# Patient Record
Sex: Female | Born: 1955
Health system: Southern US, Community
[De-identification: ages and names within clinical notes are randomized; demographics above are authoritative.]

## PROBLEM LIST (undated history)

## (undated) DIAGNOSIS — C50919 Malignant neoplasm of unspecified site of unspecified female breast: Secondary | ICD-10-CM

## (undated) DIAGNOSIS — K635 Polyp of colon: Secondary | ICD-10-CM

## (undated) DIAGNOSIS — I10 Essential (primary) hypertension: Secondary | ICD-10-CM

## (undated) DIAGNOSIS — Z8042 Family history of malignant neoplasm of prostate: Secondary | ICD-10-CM

## (undated) DIAGNOSIS — Z803 Family history of malignant neoplasm of breast: Secondary | ICD-10-CM

## (undated) DIAGNOSIS — M1990 Unspecified osteoarthritis, unspecified site: Secondary | ICD-10-CM

## (undated) HISTORY — PX: TUBAL LIGATION: SHX77

## (undated) HISTORY — PX: BREAST SURGERY: SHX581

## (undated) HISTORY — DX: Polyp of colon: K63.5

## (undated) HISTORY — DX: Family history of malignant neoplasm of breast: Z80.3

## (undated) HISTORY — PX: TONSILLECTOMY: SUR1361

## (undated) HISTORY — PX: WISDOM TOOTH EXTRACTION: SHX21

## (undated) HISTORY — PX: ROTATOR CUFF REPAIR: SHX139

## (undated) HISTORY — DX: Family history of malignant neoplasm of prostate: Z80.42

## (undated) HISTORY — PX: ABDOMINAL HYSTERECTOMY: SHX81

## (undated) HISTORY — PX: COLONOSCOPY WITH PROPOFOL: SHX5780

## (undated) HISTORY — PX: BACK SURGERY: SHX140

---
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
## 1998-01-22 DIAGNOSIS — C50919 Malignant neoplasm of unspecified site of unspecified female breast: Secondary | ICD-10-CM | POA: Insufficient documentation

## 1998-01-22 HISTORY — PX: MASTECTOMY: SHX3

## 1998-01-22 HISTORY — DX: Malignant neoplasm of unspecified site of unspecified female breast: C50.919

## 2014-08-25 ENCOUNTER — Other Ambulatory Visit: Payer: Self-pay | Admitting: Physician Assistant

## 2014-08-25 DIAGNOSIS — Z1231 Encounter for screening mammogram for malignant neoplasm of breast: Secondary | ICD-10-CM

## 2014-08-31 ENCOUNTER — Institutional Professional Consult (permissible substitution): Payer: Self-pay | Admitting: Family Medicine

## 2014-09-03 ENCOUNTER — Ambulatory Visit
Admission: RE | Admit: 2014-09-03 | Discharge: 2014-09-03 | Disposition: A | Discharge status: Home / Self Care | Payer: BLUE CROSS/BLUE SHIELD | Source: Ambulatory Visit | Attending: Physician Assistant | Admitting: Physician Assistant

## 2014-09-03 ENCOUNTER — Other Ambulatory Visit: Payer: Self-pay | Admitting: Physician Assistant

## 2014-09-03 DIAGNOSIS — Z1231 Encounter for screening mammogram for malignant neoplasm of breast: Secondary | ICD-10-CM

## 2016-02-24 ENCOUNTER — Other Ambulatory Visit: Payer: Self-pay | Admitting: Physician Assistant

## 2016-02-24 DIAGNOSIS — Z1231 Encounter for screening mammogram for malignant neoplasm of breast: Secondary | ICD-10-CM

## 2016-03-05 ENCOUNTER — Ambulatory Visit
Admission: RE | Admit: 2016-03-05 | Discharge: 2016-03-05 | Disposition: A | Discharge status: Home / Self Care | Payer: BLUE CROSS/BLUE SHIELD | Source: Ambulatory Visit | Attending: Physician Assistant | Admitting: Physician Assistant

## 2016-03-05 DIAGNOSIS — Z1231 Encounter for screening mammogram for malignant neoplasm of breast: Secondary | ICD-10-CM

## 2016-03-05 HISTORY — DX: Malignant neoplasm of unspecified site of unspecified female breast: C50.919

## 2016-07-26 DIAGNOSIS — N950 Postmenopausal bleeding: Secondary | ICD-10-CM | POA: Diagnosis not present

## 2016-08-06 DIAGNOSIS — Z853 Personal history of malignant neoplasm of breast: Secondary | ICD-10-CM | POA: Diagnosis not present

## 2016-08-16 DIAGNOSIS — N950 Postmenopausal bleeding: Secondary | ICD-10-CM | POA: Diagnosis not present

## 2016-08-16 DIAGNOSIS — N840 Polyp of corpus uteri: Secondary | ICD-10-CM | POA: Diagnosis not present

## 2016-09-04 ENCOUNTER — Encounter: Payer: Self-pay | Admitting: Genetic Counselor

## 2016-09-19 ENCOUNTER — Encounter: Payer: Self-pay | Admitting: Genetic Counselor

## 2016-09-19 ENCOUNTER — Ambulatory Visit (HOSPITAL_BASED_OUTPATIENT_CLINIC_OR_DEPARTMENT_OTHER): Payer: BLUE CROSS/BLUE SHIELD | Admitting: Genetic Counselor

## 2016-09-19 ENCOUNTER — Other Ambulatory Visit: Payer: BLUE CROSS/BLUE SHIELD

## 2016-09-19 DIAGNOSIS — C50911 Malignant neoplasm of unspecified site of right female breast: Secondary | ICD-10-CM

## 2016-09-19 DIAGNOSIS — Z170 Estrogen receptor positive status [ER+]: Secondary | ICD-10-CM

## 2016-09-19 DIAGNOSIS — K635 Polyp of colon: Secondary | ICD-10-CM

## 2016-09-19 DIAGNOSIS — Z853 Personal history of malignant neoplasm of breast: Secondary | ICD-10-CM | POA: Diagnosis not present

## 2016-09-19 DIAGNOSIS — Z8042 Family history of malignant neoplasm of prostate: Secondary | ICD-10-CM | POA: Diagnosis not present

## 2016-09-19 DIAGNOSIS — Z803 Family history of malignant neoplasm of breast: Secondary | ICD-10-CM

## 2016-09-19 DIAGNOSIS — Z315 Encounter for genetic counseling: Secondary | ICD-10-CM | POA: Diagnosis not present

## 2016-09-19 DIAGNOSIS — Z86010 Personal history of colonic polyps: Secondary | ICD-10-CM | POA: Insufficient documentation

## 2016-09-19 DIAGNOSIS — Z8371 Family history of colonic polyps: Secondary | ICD-10-CM | POA: Diagnosis not present

## 2016-09-19 DIAGNOSIS — K648 Other hemorrhoids: Secondary | ICD-10-CM | POA: Insufficient documentation

## 2016-09-19 NOTE — Progress Notes (Signed)
REFERRING PROVIDER: Mitchel Honour, DO [address], [address] [place], [place] [address]  PRIMARY PROVIDER:  No primary care provider on file.  PRIMARY REASON FOR VISIT:  1. Family history of breast cancer   2. Family history of prostate cancer   3. Hyperplastic colonic polyp, unspecified part of colon   4. Malignant neoplasm of right breast in female, estrogen receptor positive, unspecified site of breast (HCC)      HISTORY OF PRESENT ILLNESS:   Barbara Rhodes, a 61 y.o. female, was seen for a [place] cancer genetics consultation at the request of Dr. Langston Masker due to a personal and family history of cancer.  Barbara Rhodes presents to clinic today to discuss the possibility of a hereditary predisposition to cancer, genetic testing, and to further clarify her future cancer risks, as well as potential cancer risks for family members.   In 2000, at the age of 76, Barbara Rhodes was diagnosed with invasive ductal carcinoma of the right breast. This was treated with unilateral mastectomy, chemotherapy, and Arimidex.  She was diagnosed at stage IIB. The patient never underwent genetic testing when she was diagnosed.    CANCER HISTORY:   No history exists.     HORMONAL RISK FACTORS:  Menarche was at age 37.  First live birth at age 45.  OCP use for approximately 0 years.  Ovaries intact: yes.  Hysterectomy: no.  Menopausal status: postmenopausal.  HRT use: 0 years. Colonoscopy: yes; 3 hyperplastic polyps. Mammogram within the last year: yes. Number of breast biopsies: 1. Up to date with pelvic exams:  yes. 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  Past Medical History:  Diagnosis Date  . Breast cancer (HCC) 2000   Stage 2B, ER+/PR-/Her2-  . Colon polyps   . Family history of breast cancer   . Family history of prostate cancer     Past Surgical History:   Procedure Laterality Date  . MASTECTOMY Right 2000    Social History   Social History  . Marital status: Married    Spouse name: N/A  . Number of children: N/A  . Years of education: N/A   Social History Main Topics  . Smoking status: Never Smoker  . Smokeless tobacco: Never Used  . Alcohol use No  . Drug use: No  . Sexual activity: Not Asked   Other Topics Concern  . None   Social History Narrative  . None     FAMILY HISTORY:  We obtained a detailed, 4-generation family history.  Significant diagnoses are listed below: Family History  Problem Relation Age of Onset  . Breast cancer Paternal Grandmother 20  . Prostate cancer Father        dx in his 11s  . Colon polyps Father 30  . Dementia Mother   . Hypertension Mother   . Dementia Maternal Aunt   . Stroke Maternal Grandfather   . Rheum arthritis Paternal Grandfather   . Breast cancer Other        MGM's sister, post-menopausal breast cancer  . Breast cancer Cousin 62       paternal 2nd cousin    The patient has two daughters and a son who are cancer free.  She has a living brother and sister and a deceased brother.  The brother died at 4 from a reaction to anesthesia.  The living siblings are  cancer free.  The patient's mother is alive with vascular dementia at 72.  She has one sister who also has vascular dementia.  Her aunt has one daughter who had a liver transplant at age 38.  Both maternal grandparents are deceased from non cancer related issues.  The patient's father was diagnosed with colon polyps at age 23 and prostate cancer in his 70's.  He died of prostate cancer at 74.  He was an only child.  His mother had breast cancer at 63 and his father had rheumatoid arthritis.  His mother had three sisters, one had breast cancer over 88, and another had a granddaughter with breast cancer at 42.  Barbara Rhodes is unaware of previous family history of genetic testing for hereditary cancer risks. Patient's  maternal ancestors are of [place] descent, and paternal ancestors are of [place] and [place] descent. There is no reported Ashkenazi Jewish ancestry. There is no known consanguinity.  GENETIC COUNSELING ASSESSMENT: Barbara Rhodes is a 61 y.o. female with a personal and family history of cancer which is somewhat suggestive of a hereditary cancer syndromes and predisposition to cancer. We, therefore, discussed and recommended the following at today's visit.   DISCUSSION: We discussed that about 5-10% of breast cancer is hereditary with most cases due to BRCA mutations.  Other genes that can also increase our risk for hereditary breast cancer include ATM, CHEK2 and PALB2.  We reviewed the characteristics, features and inheritance patterns of hereditary cancer syndromes. We also discussed genetic testing, including the appropriate family members to test, the process of testing, insurance coverage and turn-around-time for results. We discussed the implications of a negative, positive and/or variant of uncertain significant result. We recommended Barbara Rhodes pursue genetic testing for the Common hereditary cancer gene panel. The Hereditary Gene Panel offered by [facility] includes sequencing and/or deletion duplication testing of the following 46 genes: APC, ATM, AXIN2, BARD1, BMPR1A, BRCA1, BRCA2, BRIP1, CDH1, CDKN2A (p14ARF), CDKN2A (p16INK4a), CHEK2, CTNNA1, DICER1, EPCAM (Deletion/duplication testing only), GREM1 (promoter region deletion/duplication testing only), KIT, MEN1, MLH1, MSH2, MSH3, MSH6, MUTYH, NBN, NF1, NHTL1, PALB2, PDGFRA, PMS2, POLD1, POLE, PTEN, RAD50, RAD51C, RAD51D, SDHB, SDHC, SDHD, SMAD4, SMARCA4. STK11, TP53, TSC1, TSC2, and VHL.  The following genes were evaluated for sequence changes only: SDHA and HOXB13 c.251G>A variant only.  Based on Barbara Rhodes's personal and family history of cancer, she meets medical criteria for genetic testing. Despite that she meets criteria, she may  still have an out of pocket cost. We discussed that if her out of pocket cost for testing is over $100, the laboratory will call and confirm whether she wants to proceed with testing.  If the out of pocket cost of testing is less than $100 she will be billed by the genetic testing laboratory.   PLAN: After considering the risks, benefits, and limitations, Barbara Rhodes  provided informed consent to pursue genetic testing and the blood sample was sent to [facility] for analysis of the Common Hereditary cancer panel. Results should be available within approximately 2-3 weeks' time, at which point they will be disclosed by telephone to Barbara Rhodes, as will any additional recommendations warranted by these results. Barbara Rhodes will receive a summary of her genetic counseling visit and a copy of her results once available. This information will also be available in Epic. We encouraged Barbara Rhodes to remain in contact with cancer genetics annually so that we can continuously update the family history and inform her of any changes in cancer genetics  and testing that may be of benefit for her family. Barbara Rhodes questions were answered to her satisfaction today. Our contact information was provided should additional questions or concerns arise.  Lastly, we encouraged Barbara Rhodes to remain in contact with cancer genetics annually so that we can continuously update the family history and inform her of any changes in cancer genetics and testing that may be of benefit for this family.   Ms.  Rhodes questions were answered to her satisfaction today. Our contact information was provided should additional questions or concerns arise. Thank you for the referral and allowing [place] to share in the care of your patient.   Barbara Rhodes P. Florene Glen, [place], [facility] Certified Genetic Counselor [email or web address] phone: [phone]  The patient was seen for a total of 55 minutes in face-to-face genetic  counseling.  This patient was discussed with Drs. Magrinat, Lindi Adie and/or Burr Medico who agrees with the above.    _______________________________________________________________________ For Office Staff:  Number of people involved in session: 1 Was an Intern/ student involved with case: no

## 2016-09-28 ENCOUNTER — Telehealth: Payer: Self-pay | Admitting: Genetic Counselor

## 2016-09-28 ENCOUNTER — Encounter: Payer: Self-pay | Admitting: Genetic Counselor

## 2016-09-28 DIAGNOSIS — Z1379 Encounter for other screening for genetic and chromosomal anomalies: Secondary | ICD-10-CM | POA: Insufficient documentation

## 2016-09-28 NOTE — Telephone Encounter (Signed)
Revealed that a BRCA2 pathogenic variant was identified and an NBN VUS was also found.  Discussed that we would like to have her come in and discuss this further.  Her daughter lives in [place], and she will see what her schedule is so that she can undergo testing as well.  Patient will CB. To set up appointment.

## 2016-10-03 ENCOUNTER — Ambulatory Visit: Payer: BLUE CROSS/BLUE SHIELD | Admitting: Genetic Counselor

## 2016-10-03 DIAGNOSIS — C50911 Malignant neoplasm of unspecified site of right female breast: Secondary | ICD-10-CM

## 2016-10-03 DIAGNOSIS — Z1501 Genetic susceptibility to malignant neoplasm of breast: Secondary | ICD-10-CM

## 2016-10-03 DIAGNOSIS — Z803 Family history of malignant neoplasm of breast: Secondary | ICD-10-CM

## 2016-10-03 DIAGNOSIS — K648 Other hemorrhoids: Secondary | ICD-10-CM | POA: Diagnosis not present

## 2016-10-03 DIAGNOSIS — Z1379 Encounter for other screening for genetic and chromosomal anomalies: Secondary | ICD-10-CM

## 2016-10-03 DIAGNOSIS — Z8042 Family history of malignant neoplasm of prostate: Secondary | ICD-10-CM

## 2016-10-03 DIAGNOSIS — Z1589 Genetic susceptibility to other disease: Secondary | ICD-10-CM

## 2016-10-03 DIAGNOSIS — Z1509 Genetic susceptibility to other malignant neoplasm: Secondary | ICD-10-CM

## 2016-10-03 DIAGNOSIS — Z170 Estrogen receptor positive status [ER+]: Secondary | ICD-10-CM

## 2016-10-03 NOTE — Progress Notes (Signed)
GENETIC TEST RESULTS   Patient Name: Maury Bamba Patient Age: 61 y.o. Encounter Date: 10/03/2016  Referring Provider: Linda Hedges, MD    Ms. Backes was seen in the [place] clinic on October 03, 2016 due to a personal and family history of cancer and concern regarding a hereditary predisposition to cancer in the family. Please refer to the prior Genetics clinic note for more information regarding Ms. Groeneveld's medical and family histories and our assessment at the time.   FAMILY HISTORY:  We obtained a detailed, 4-generation family history.  Significant diagnoses are listed below: Family History  Problem Relation Age of Onset  . Breast cancer Paternal Grandmother 65  . Prostate cancer Father        dx in his 41s  . Colon polyps Father 43  . Dementia Mother   . Hypertension Mother   . Dementia Maternal Aunt   . Stroke Maternal Grandfather   . Rheum arthritis Paternal Grandfather   . Breast cancer Other        MGM's sister, post-menopausal breast cancer  . Breast cancer Cousin 90       paternal 2nd cousin    The patient has two daughters and a son who are cancer free.  She has a living brother and sister and a deceased brother.  The brother died at 79 from a reaction to anesthesia.  The living siblings are cancer free.  The patient's mother is alive with vascular dementia at 65.  She has one sister who also has vascular dementia.  Her aunt has one daughter who had a liver transplant at age 62.  Both maternal grandparents are deceased from non cancer related issues.  The patient's father was diagnosed with colon polyps at age 98 and prostate cancer in his 34's.  He died of prostate cancer at 30.  He was an only child.  His mother had breast cancer at 86 and his father had rheumatoid arthritis.  His mother had three sisters, one had breast cancer over 3, and another had a granddaughter with breast cancer at 22.  Ms. Brents is unaware of previous family  history of genetic testing for hereditary cancer risks. Patient's maternal ancestors are of [place] descent, and paternal ancestors are of [place] and [place] descent. There is no reported Ashkenazi Jewish ancestry. There is no known consanguinity.  GENETIC TESTING:  At the time of Ms. Pettibone's visit, we recommended she pursue genetic testing of the Common Hereditary Cancer test. The genetic testing reported on September 28, 2016 through the Common Hereditary Cancer Panel offered by [facility] identified a single, heterozygous pathogenic gene mutation called BRCA2, E.8315-1V>O (Splice acceptor).   Genetic testing did detect a Variant of Unknown Significance in the NBN gene called c.938C>T. At this time, it is unknown if this variant is associated with increased cancer risk or if this is a normal finding, but most variants such as this get reclassified to being inconsequential. It should not be used to make medical management decisions. With time, we suspect the lab will determine the significance of this variant, if any. If we do learn more about it, we will try to contact Ms. Mehring to discuss it further. However, it is important to stay in touch with [place] periodically and keep the address and phone number up to date.    MEDICAL MANAGEMENT: Women who have a BRCA mutation have an increased risk for both breast and ovarian cancer.   As discussed with Ms. Zellers, to reduce the risk  for breast cancer, prophylactic bilateral mastectomy is the most effective option for risk reduction. However, for women who choose to keep their breasts intensified screening is equally safe.  We recommend yearly mammograms, yearly breast MRI, twice-yearly clinical breast exams, and monthly self-breast exams. Ms. Kirt is referred for her mammograms by Dr. Linda Hedges.  She will follow up with this provider in regards to her risk for breast cancer.  Ms. Zieske has declined an appointment in our high risk clinic  to discuss surgical options.  To reduce the risk for ovarian cancer, we recommend Ms. Sherrill have a prophylactic bilateral salpingo-oophorectomy when childbearing is completed, if planned. We discussed that screening with CA-125 blood tests and transvaginal ultrasounds can be done twice per year. However, these tests have not been shown to detect ovarian cancer at an early stage.  Ms. Mcneil will follow up with Dr. Linda Hedges in regards to her ovarian cancer risk.    RISK REDUCTION: There are several things that can be offered to individuals who are carriers for BRCA mutations that will reduce the risk for getting cancer.   The use of oral contraceptives can lower the risk for ovarian cancer, and, per case control studies, does not significantly increase the risk for breast cancer in BRCA patients. Case control studies have shown that oral contraceptives can lower the risk for ovarian cancer in women with BRCA mutations. Additionally, a more recent meta-analysis, including one cohort (n=3,181) and one case control study (1,096 cases and 2,878 controls) also showed an inverse correlation between ovarian cancer and ever having used oral contraceptives (OR, 0.58; 95% CI = 0.46-0.73). Studies on oral contraceptives and breast cancer have been conflicting, with some studies suggesting that there is not an increased risk for breast cancer in BRCA mutation carriers, while others suggest that there could be a risk. That said, two meta-analysis studies have shown that there is not an increased risk for breast cancer with oral contraceptive use in BRCA1 and BRCA2 carriers.   In individuals who have a prophylactic bilateral salpingo-oophorectomy (BSO), the risk for breast cancer is reduced by up to 50%. It has been reported that short term hormone replacement therapy in women undergoing prophylactic BSO does not negate the reduction of breast cancer risk associated with surgery (1.2018 NCCN  guidelines).  FAMILY MEMBERS: It is important that all of Ms. Truman's relatives (both men and women) know of the presence of this gene mutation. Site-specific genetic testing can sort out who in the family is at risk and who is not.   Ms. Trueheart children and siblings have a 50% chance to have inherited this mutation. We recommend they have genetic testing for this same mutation, as identifying the presence of this mutation would allow them to also take advantage of risk-reducing measures. She has already contacted all of her children and siblings.  We provided information for her son on places to go for genetic testing in [place], [place].  SUPPORT AND RESOURCES: If Ms. Dulski is interested in BRCA-specific information and support, there are two groups, Facing Our Risk (www.facingourrisk.com) and Bright Pink (www.brightpink.org) which some people have found useful. They provide opportunities to speak with other individuals from high-risk families. To locate genetic counselors in other cities, visit the website of the [employer] (ArtistMovie.se) and Secretary/administrator for a Social worker by zip code.  We encouraged Ms. Ng to remain in contact with [place] on an annual basis so we can update her personal and family histories, and  let her know of advances in cancer genetics that may benefit the family. Our contact number was provided. Ms. Honsinger questions were answered to her satisfaction today, and she knows she is welcome to call anytime with additional questions.   Nikan Ellingson P. Florene Glen, [place], [facility] Certified Genetic Counselor [email or web address] phone: [phone]

## 2016-10-09 DIAGNOSIS — N83299 Other ovarian cyst, unspecified side: Secondary | ICD-10-CM | POA: Diagnosis not present

## 2016-10-17 DIAGNOSIS — K648 Other hemorrhoids: Secondary | ICD-10-CM | POA: Diagnosis not present

## 2016-10-29 DIAGNOSIS — Z6835 Body mass index (BMI) 35.0-35.9, adult: Secondary | ICD-10-CM | POA: Diagnosis not present

## 2016-10-29 DIAGNOSIS — M545 Low back pain: Secondary | ICD-10-CM | POA: Diagnosis not present

## 2016-11-12 DIAGNOSIS — D2262 Melanocytic nevi of left upper limb, including shoulder: Secondary | ICD-10-CM | POA: Diagnosis not present

## 2016-11-12 DIAGNOSIS — D2261 Melanocytic nevi of right upper limb, including shoulder: Secondary | ICD-10-CM | POA: Diagnosis not present

## 2016-11-12 DIAGNOSIS — D225 Melanocytic nevi of trunk: Secondary | ICD-10-CM | POA: Diagnosis not present

## 2016-11-12 DIAGNOSIS — L570 Actinic keratosis: Secondary | ICD-10-CM | POA: Diagnosis not present

## 2016-11-12 DIAGNOSIS — L821 Other seborrheic keratosis: Secondary | ICD-10-CM | POA: Diagnosis not present

## 2016-11-13 DIAGNOSIS — M545 Low back pain: Secondary | ICD-10-CM | POA: Diagnosis not present

## 2016-12-24 DIAGNOSIS — Z1501 Genetic susceptibility to malignant neoplasm of breast: Secondary | ICD-10-CM | POA: Diagnosis not present

## 2017-02-13 DIAGNOSIS — Z8719 Personal history of other diseases of the digestive system: Secondary | ICD-10-CM | POA: Diagnosis not present

## 2017-02-13 DIAGNOSIS — Z9889 Other specified postprocedural states: Secondary | ICD-10-CM | POA: Diagnosis not present

## 2017-02-13 DIAGNOSIS — K625 Hemorrhage of anus and rectum: Secondary | ICD-10-CM | POA: Diagnosis not present

## 2017-03-18 DIAGNOSIS — N83291 Other ovarian cyst, right side: Secondary | ICD-10-CM | POA: Diagnosis not present

## 2017-03-18 DIAGNOSIS — Z1501 Genetic susceptibility to malignant neoplasm of breast: Secondary | ICD-10-CM | POA: Diagnosis not present

## 2017-03-18 DIAGNOSIS — Z4002 Encounter for prophylactic removal of ovary: Secondary | ICD-10-CM | POA: Diagnosis not present

## 2017-03-18 DIAGNOSIS — Z1502 Genetic susceptibility to malignant neoplasm of ovary: Secondary | ICD-10-CM | POA: Diagnosis not present

## 2017-03-18 DIAGNOSIS — R8589 Other abnormal findings in specimens from digestive organs and abdominal cavity: Secondary | ICD-10-CM | POA: Diagnosis not present

## 2017-03-18 DIAGNOSIS — Z853 Personal history of malignant neoplasm of breast: Secondary | ICD-10-CM | POA: Diagnosis not present

## 2017-03-18 DIAGNOSIS — N83201 Unspecified ovarian cyst, right side: Secondary | ICD-10-CM | POA: Diagnosis not present

## 2017-03-22 DIAGNOSIS — M23306 Other meniscus derangements, unspecified meniscus, right knee: Secondary | ICD-10-CM | POA: Diagnosis not present

## 2017-03-22 DIAGNOSIS — M25561 Pain in right knee: Secondary | ICD-10-CM | POA: Diagnosis not present

## 2017-03-22 DIAGNOSIS — M259 Joint disorder, unspecified: Secondary | ICD-10-CM | POA: Insufficient documentation

## 2017-04-05 ENCOUNTER — Other Ambulatory Visit: Payer: Self-pay | Admitting: Obstetrics & Gynecology

## 2017-04-05 DIAGNOSIS — Z1231 Encounter for screening mammogram for malignant neoplasm of breast: Secondary | ICD-10-CM

## 2017-04-09 ENCOUNTER — Ambulatory Visit: Payer: BLUE CROSS/BLUE SHIELD

## 2017-04-24 ENCOUNTER — Ambulatory Visit
Admission: RE | Admit: 2017-04-24 | Discharge: 2017-04-24 | Disposition: A | Discharge status: Home / Self Care | Payer: BLUE CROSS/BLUE SHIELD | Source: Ambulatory Visit | Attending: Obstetrics & Gynecology | Admitting: Obstetrics & Gynecology

## 2017-04-24 DIAGNOSIS — Z1231 Encounter for screening mammogram for malignant neoplasm of breast: Secondary | ICD-10-CM

## 2017-06-24 DIAGNOSIS — Z0000 Encounter for general adult medical examination without abnormal findings: Secondary | ICD-10-CM | POA: Diagnosis not present

## 2017-06-24 DIAGNOSIS — Z6836 Body mass index (BMI) 36.0-36.9, adult: Secondary | ICD-10-CM | POA: Diagnosis not present

## 2017-06-27 DIAGNOSIS — S8011XA Contusion of right lower leg, initial encounter: Secondary | ICD-10-CM | POA: Diagnosis not present

## 2017-06-27 DIAGNOSIS — Z853 Personal history of malignant neoplasm of breast: Secondary | ICD-10-CM | POA: Diagnosis not present

## 2017-06-27 DIAGNOSIS — E559 Vitamin D deficiency, unspecified: Secondary | ICD-10-CM | POA: Diagnosis not present

## 2017-06-27 DIAGNOSIS — M545 Low back pain: Secondary | ICD-10-CM | POA: Diagnosis not present

## 2017-06-27 DIAGNOSIS — N950 Postmenopausal bleeding: Secondary | ICD-10-CM | POA: Diagnosis not present

## 2017-08-12 DIAGNOSIS — Z01419 Encounter for gynecological examination (general) (routine) without abnormal findings: Secondary | ICD-10-CM | POA: Diagnosis not present

## 2017-08-12 DIAGNOSIS — Z6837 Body mass index (BMI) 37.0-37.9, adult: Secondary | ICD-10-CM | POA: Diagnosis not present

## 2017-09-25 ENCOUNTER — Encounter: Payer: Self-pay | Admitting: Podiatry

## 2017-09-25 ENCOUNTER — Ambulatory Visit: Payer: BLUE CROSS/BLUE SHIELD | Admitting: Podiatry

## 2017-09-25 VITALS — BP 131/79 | HR 69

## 2017-09-25 DIAGNOSIS — M79675 Pain in left toe(s): Secondary | ICD-10-CM

## 2017-09-25 DIAGNOSIS — B351 Tinea unguium: Secondary | ICD-10-CM

## 2017-09-25 DIAGNOSIS — M79674 Pain in right toe(s): Secondary | ICD-10-CM

## 2017-09-25 DIAGNOSIS — L608 Other nail disorders: Secondary | ICD-10-CM | POA: Diagnosis not present

## 2017-09-25 NOTE — Progress Notes (Signed)
This patient presents to the office with chief complaint of white  discolored great toenails, both feet.  She says she has had problems with her nails over the last few years and they have grown thick and disfigured. . She says she's had a problem with her nails breaking off.  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 She presents the office today for an evaluation and treatment of her thickened nails.  General Appearance  Alert, conversant and in no acute stress.  Vascular  Dorsalis pedis and posterior tibial  pulses are palpable  bilaterally.  Capillary return is within normal limits  bilaterally. Temperature is within normal limits  bilaterally.  Neurologic  Senn-Weinstein monofilament wire test within normal limits  bilaterally. Muscle power within normal limits bilaterally.  Nails Thick disfigured discolored nails with subungual debris  from hallux to fifth toes bilaterally. No evidence of bacterial infection or drainage bilaterally.  Orthopedic  No limitations of motion  feet .  No crepitus or effusions noted.  Hallux limitus 1st MPJ  B/L.  Bony exostosis mid rearfoot left foot.    Skin  normotropic skin with no porokeratosis noted bilaterally.  No signs of infections or ulcers noted.    Onychomycosis  Neuropathy  IE>  Debridement of nails  X 10.    RTC 3 months.     Gardiner Barefoot DPM

## 2017-10-02 DIAGNOSIS — L570 Actinic keratosis: Secondary | ICD-10-CM | POA: Diagnosis not present

## 2017-10-02 DIAGNOSIS — L820 Inflamed seborrheic keratosis: Secondary | ICD-10-CM | POA: Diagnosis not present

## 2017-10-02 DIAGNOSIS — D0439 Carcinoma in situ of skin of other parts of face: Secondary | ICD-10-CM | POA: Diagnosis not present

## 2017-12-10 DIAGNOSIS — M542 Cervicalgia: Secondary | ICD-10-CM | POA: Diagnosis not present

## 2017-12-10 DIAGNOSIS — Z6835 Body mass index (BMI) 35.0-35.9, adult: Secondary | ICD-10-CM | POA: Diagnosis not present

## 2017-12-12 DIAGNOSIS — M542 Cervicalgia: Secondary | ICD-10-CM | POA: Diagnosis not present

## 2017-12-25 ENCOUNTER — Ambulatory Visit: Payer: BLUE CROSS/BLUE SHIELD | Admitting: Podiatry

## 2017-12-25 ENCOUNTER — Encounter: Payer: Self-pay | Admitting: Podiatry

## 2017-12-25 DIAGNOSIS — B351 Tinea unguium: Secondary | ICD-10-CM | POA: Diagnosis not present

## 2017-12-25 DIAGNOSIS — T451X5A Adverse effect of antineoplastic and immunosuppressive drugs, initial encounter: Secondary | ICD-10-CM

## 2017-12-25 DIAGNOSIS — M79675 Pain in left toe(s): Secondary | ICD-10-CM

## 2017-12-25 DIAGNOSIS — G620 Drug-induced polyneuropathy: Secondary | ICD-10-CM | POA: Diagnosis not present

## 2017-12-25 DIAGNOSIS — L608 Other nail disorders: Secondary | ICD-10-CM

## 2017-12-25 DIAGNOSIS — M79674 Pain in right toe(s): Secondary | ICD-10-CM

## 2017-12-25 NOTE — Patient Instructions (Signed)

## 2017-12-25 NOTE — Progress Notes (Signed)
Subjective: Barbara Rhodes presents today for follow-up of mycotic toenails.  She relates that she did not get a clear answer as to whether she could have pedicures or not.    She has a history of chemotherapy-induced neuropathy.  She does have an abrasion on the dorsum of her left foot which is healing.  She states her dog stepped on her foot.  She voices no new pedal concerns on today's visit  Objective: Vascular Examination: Capillary refill time is immediate to all 10 digits.  Dorsalis pedis and posterior tibial pulses are palpable bilaterally.  Skin temperature gradient within normal limits bilaterally.  Digital hair is present to all 10 digits.  Dermatological Examination: Skin with normal turgor texture and tone bilaterally.  She has small abrasion noted on the dorsum of her left foot which is healing.  There is no edema, no erythema, no drainage noted.  Toenails 1-5 b/l discolored, mildly thick, dystrophic with subungual debris and pain with palpation to nailbeds due to thickness of nails.  Musculoskeletal: Muscle strength 5/5 to all LE muscle groups  Hallux limitus first metatarsal phalangeal joint bilaterally.  Bony exostosis mid Barefoot left foot.  Neurological: Sensation intact with 10 gram monofilament. Vibratory sensation intact.  Assessment: 1. Painful onychomycosis toenails 1-5 b/l  2.  Neuropathy  Plan: 1. Toenails 1-5 b/l were debrided in length and girth without iatrogenic bleeding.  We discussed pedicures.  I advised her to take her own instruments and her own nail polish.  She is to make sure that a plastic bag is used when soaking her feet. 2. Patient to continue soft, supportive shoe gear 3. Patient to report any pedal injuries to medical professional immediately. 4. Follow up 3 months. Patient/POA to call should there be a concern in the interim.

## 2017-12-31 ENCOUNTER — Other Ambulatory Visit: Payer: Self-pay | Admitting: Neurosurgery

## 2017-12-31 DIAGNOSIS — M5030 Other cervical disc degeneration, unspecified cervical region: Secondary | ICD-10-CM | POA: Diagnosis not present

## 2017-12-31 DIAGNOSIS — M5023 Other cervical disc displacement, cervicothoracic region: Secondary | ICD-10-CM | POA: Diagnosis not present

## 2017-12-31 DIAGNOSIS — M542 Cervicalgia: Secondary | ICD-10-CM | POA: Diagnosis not present

## 2017-12-31 DIAGNOSIS — M5412 Radiculopathy, cervical region: Secondary | ICD-10-CM | POA: Diagnosis not present

## 2017-12-31 DIAGNOSIS — M4722 Other spondylosis with radiculopathy, cervical region: Secondary | ICD-10-CM | POA: Diagnosis not present

## 2018-01-07 ENCOUNTER — Other Ambulatory Visit: Payer: Self-pay | Admitting: Neurosurgery

## 2018-01-16 NOTE — Pre-Procedure Instructions (Addendum)
Barbara Rhodes  01/16/2018      [id number] [place],  [address] [place] [address] Phone: [phone] Fax: [phone]    Your procedure is scheduled on Thursday, January 2nd.  Report to [facility] Admitting at 6:45 A.M.  Call this number if you have problems the morning of surgery:  [phone]   Remember:  Do not eat or drink after midnight.    Take these medicines the morning of surgery with A SIP OF WATER: NONE  7 days prior to surgery STOP taking any Aspirin (unless otherwise instructed by your surgeon), Aleve, Naproxen, Ibuprofen, Motrin, Advil, Goody's, BC's, all herbal medications, fish oil, and all vitamins. Including: celecoxib (CELEBREX).     Do not wear jewelry, make-up or nail polish.  Do not wear lotions, powders, or perfumes, or deodorant.  Do not shave 48 hours prior to surgery.    Do not bring valuables to the hospital.  [facility] is not responsible for any belongings or valuables.  Contacts, dentures or bridgework may not be worn into surgery.  Leave your suitcase in the car.  After surgery it may be brought to your room.  For patients admitted to the hospital, discharge time will be determined by your treatment team.  Patients discharged the day of surgery will not be allowed to drive home.   Special instructions:   [place]- Preparing For Surgery  Before surgery, you can play an important role. Because skin is not sterile, your skin needs to be as free of germs as possible. You can reduce the number of germs on your skin by washing with CHG (chlorahexidine gluconate) Soap before surgery.  CHG is an antiseptic cleaner which kills germs and bonds with the skin to continue killing germs even after washing.    Oral Hygiene is also important to reduce your risk of infection.  Remember - BRUSH YOUR TEETH THE MORNING OF SURGERY WITH YOUR REGULAR TOOTHPASTE  Please  do not use if you have an allergy to CHG or antibacterial soaps. If your skin becomes reddened/irritated stop using the CHG.  Do not shave (including legs and underarms) for at least 48 hours prior to first CHG shower. It is OK to shave your face.  Please follow these instructions carefully.   1. Shower the NIGHT BEFORE SURGERY and the MORNING OF SURGERY with CHG.   2. If you chose to wash your hair, wash your hair first as usual with your normal shampoo.  3. After you shampoo, rinse your hair and body thoroughly to remove the shampoo.  4. Use CHG as you would any other liquid soap. You can apply CHG directly to the skin and wash gently with a scrungie or a clean washcloth.   5. Apply the CHG Soap to your body ONLY FROM THE NECK DOWN.  Do not use on open wounds or open sores. Avoid contact with your eyes, ears, mouth and genitals (private parts). Wash Face and genitals (private parts)  with your normal soap.  6. Wash thoroughly, paying special attention to the area where your surgery will be performed.  7. Thoroughly rinse your body with warm water from the neck down.  8. DO NOT shower/wash with your normal soap after using and rinsing off the CHG Soap.  9. Pat yourself dry with a CLEAN TOWEL.  10. Wear CLEAN PAJAMAS to bed the night before surgery, wear comfortable  clothes the morning of surgery  11. Place CLEAN SHEETS on your bed the night of your first shower and DO NOT SLEEP WITH PETS.    Day of Surgery: Repeat shower as stated above.  Do not apply any deodorants/lotions.  Please wear clean clothes to the hospital/surgery center.   Remember to brush your teeth WITH YOUR REGULAR TOOTHPASTE.   Please read over the following fact sheets that you were given.

## 2018-01-17 ENCOUNTER — Encounter (HOSPITAL_COMMUNITY): Payer: Self-pay

## 2018-01-17 ENCOUNTER — Other Ambulatory Visit: Payer: Self-pay

## 2018-01-17 ENCOUNTER — Encounter (HOSPITAL_COMMUNITY)
Admission: RE | Admit: 2018-01-17 | Discharge: 2018-01-17 | Disposition: A | Discharge status: Home / Self Care | Payer: BLUE CROSS/BLUE SHIELD | Source: Ambulatory Visit | Attending: Neurosurgery | Admitting: Neurosurgery

## 2018-01-17 DIAGNOSIS — Z01818 Encounter for other preprocedural examination: Secondary | ICD-10-CM | POA: Diagnosis not present

## 2018-01-17 DIAGNOSIS — M5023 Other cervical disc displacement, cervicothoracic region: Secondary | ICD-10-CM | POA: Insufficient documentation

## 2018-01-17 HISTORY — DX: Essential (primary) hypertension: I10

## 2018-01-17 HISTORY — DX: Unspecified osteoarthritis, unspecified site: M19.90

## 2018-01-17 LAB — CBC
HEMATOCRIT: 40.3 % (ref 36.0–46.0)
Hemoglobin: 13.4 g/dL (ref 12.0–15.0)
MCH: 30.6 pg (ref 26.0–34.0)
MCHC: 33.3 g/dL (ref 30.0–36.0)
MCV: 92 fL (ref 80.0–100.0)
Platelets: 299 10*3/uL (ref 150–400)
RBC: 4.38 MIL/uL (ref 3.87–5.11)
RDW: 13.2 % (ref 11.5–15.5)
WBC: 7.5 10*3/uL (ref 4.0–10.5)
nRBC: 0 % (ref 0.0–0.2)

## 2018-01-17 LAB — BASIC METABOLIC PANEL
Anion gap: 10 (ref 5–15)
BUN: 11 mg/dL (ref 8–23)
CO2: 26 mmol/L (ref 22–32)
Calcium: 9.3 mg/dL (ref 8.9–10.3)
Chloride: 106 mmol/L (ref 98–111)
Creatinine, Ser: 0.97 mg/dL (ref 0.44–1.00)
GFR calc Af Amer: >60 mL/min (ref >60)
GFR calc non Af Amer: >60 mL/min (ref >60)
Glucose, Bld: 112 mg/dL — ABNORMAL HIGH (ref 70–99)
Potassium: 3.4 mmol/L — ABNORMAL LOW (ref 3.5–5.1)
Sodium: 142 mmol/L (ref 135–145)

## 2018-01-17 LAB — SURGICAL PCR SCREEN
MRSA, PCR: NEGATIVE
Staphylococcus aureus: NEGATIVE

## 2018-01-17 NOTE — Progress Notes (Signed)
Olivette Beckmann            01/16/2018                          [id number] - [place], [place] [address] [place] [address] Phone: [phone] Fax: [phone]              Your procedure is scheduled on Thursday, January 2nd.            Report to [facility] Admitting at 530 A.M.            Call this number if you have problems the morning of surgery:            [phone]             Remember:            Do not eat or drink after midnight.                        Take these medicines the morning of surgery with A SIP OF WATER: NONE  7 days prior to surgery STOP taking any Aspirin (unless otherwise instructed by your surgeon), Aleve, Naproxen, Ibuprofen, Motrin, Advil, Goody's, BC's, all herbal medications, fish oil, and all vitamins. Including: celecoxib (CELEBREX).                         Do not wear jewelry, make-up or nail polish.            Do not wear lotions, powders, or perfumes, or deodorant.            Do not shave 48 hours prior to surgery.              Do not bring valuables to the hospital.            [facility] is not responsible for any belongings or valuables.  Contacts, dentures or bridgework may not be worn into surgery.  Leave your suitcase in the car.  After surgery it may be brought to your room.  For patients admitted to the hospital, discharge time will be determined by your treatment team.  Patients discharged the day of surgery will not be allowed to drive home.   Special instructions:   [facility]- Preparing For Surgery  Before surgery, you can play an important role. Because skin is not sterile, your skin needs to be as free of germs as possible. You can reduce the number of germs on your skin by washing with CHG (chlorahexidine gluconate) Soap before surgery.  CHG is an antiseptic cleaner which kills germs and bonds with the skin to continue killing  germs even after washing.    Oral Hygiene is also important to reduce your risk of infection.  Remember - BRUSH YOUR TEETH THE MORNING OF SURGERY WITH YOUR REGULAR TOOTHPASTE  Please do not use if you have an allergy to CHG or antibacterial soaps. If your skin becomes reddened/irritated stop using the CHG.  Do not shave (including legs and underarms) for at least 48 hours prior to first CHG shower. It is OK to shave your face.  Please follow these instructions carefully.  1. Shower the NIGHT BEFORE SURGERY and the MORNING OF SURGERY with CHG.   2. If you chose to wash your hair, wash your hair first as usual with your normal shampoo.  3. After you shampoo, rinse your hair and body thoroughly to remove the shampoo.  4. Use CHG as you would any other liquid soap. You can apply CHG directly to the skin and wash gently with a scrungie or a clean washcloth.   5. Apply the CHG Soap to your body ONLY FROM THE NECK DOWN.  Do not use on open wounds or open sores. Avoid contact with your eyes, ears, mouth and genitals (private parts). Wash Face and genitals (private parts)  with your normal soap.  6. Wash thoroughly, paying special attention to the area where your surgery will be performed.  7. Thoroughly rinse your body with warm water from the neck down.  8. DO NOT shower/wash with your normal soap after using and rinsing off the CHG Soap.  9. Pat yourself dry with a CLEAN TOWEL.  10. Wear CLEAN PAJAMAS to bed the night before surgery, wear comfortable clothes the morning of surgery  11. Place CLEAN SHEETS on your bed the night of your first shower and DO NOT SLEEP WITH PETS.    Day of Surgery: Repeat shower as stated above.  Do not apply any deodorants/lotions.  Please wear clean clothes to the hospital/surgery center.   Remember to brush your teeth WITH  YOUR REGULAR TOOTHPASTE.   Please read over the following fact sheets that you were given.

## 2018-01-17 NOTE — Progress Notes (Addendum)
PCP - Clemmie Krill, PA-C Cardiologist - pt denies  Chest x-ray - pt denies past year, no recent respiratory infections/complications EKG - 34/62/19  Stress Test -  Pt denies ECHO - pt denies  Cardiac Cath - pt denies  Sleep Study - pt denies CPAP - n/a  Fasting Blood Sugar - n/a Checks Blood Sugar _____ times a day-n/a  Blood Thinner Instructions: n/a Aspirin Instructions: n/a  Anesthesia review:   Patient denies shortness of breath, fever, cough and chest pain at PAT appointment  Patient verbalized understanding of instructions that were given to them at the PAT appointment. Patient was also instructed that they will need to review over the PAT instructions again at home before surgery.

## 2018-01-22 NOTE — Anesthesia Preprocedure Evaluation (Addendum)
Anesthesia Evaluation  Patient identified by MRN, date of birth, ID band Patient awake    Reviewed: Allergy & Precautions, NPO status , Patient's Chart, lab work & pertinent test results  Airway Mallampati: II  TM Distance: >3 FB Neck ROM: Full    Dental  (+) Dental Advisory Given   Pulmonary neg pulmonary ROS,    breath sounds clear to auscultation       Cardiovascular hypertension, Pt. on medications  Rhythm:Regular Rate:Normal     Neuro/Psych negative neurological ROS     GI/Hepatic negative GI ROS, Neg liver ROS,   Endo/Other  negative endocrine ROS  Renal/GU negative Renal ROS     Musculoskeletal  (+) Arthritis ,   Abdominal   Peds  Hematology negative hematology ROS (+)   Anesthesia Other Findings   Reproductive/Obstetrics                            Lab Results  Component Value Date   WBC 7.5 01/17/2018   HGB 13.4 01/17/2018   HCT 40.3 01/17/2018   MCV 92.0 01/17/2018   PLT 299 01/17/2018   Lab Results  Component Value Date   CREATININE 0.97 01/17/2018   BUN 11 01/17/2018   NA 142 01/17/2018   K 3.4 (L) 01/17/2018   CL 106 01/17/2018   CO2 26 01/17/2018    Anesthesia Physical Anesthesia Plan  ASA: II  Anesthesia Plan: General   Post-op Pain Management:    Induction: Intravenous  PONV Risk Score and Plan: 3 and Dexamethasone, Ondansetron and Treatment may vary due to age or medical condition  Airway Management Planned: Oral ETT  Additional Equipment:   Intra-op Plan:   Post-operative Plan: Extubation in OR  Informed Consent: I have reviewed the patients History and Physical, chart, labs and discussed the procedure including the risks, benefits and alternatives for the proposed anesthesia with the patient or authorized representative who has indicated his/her understanding and acceptance.   Dental advisory given  Plan Discussed with: CRNA  Anesthesia  Plan Comments:        Anesthesia Quick Evaluation

## 2018-01-23 ENCOUNTER — Other Ambulatory Visit: Payer: Self-pay

## 2018-01-23 ENCOUNTER — Ambulatory Visit (HOSPITAL_COMMUNITY): Payer: BLUE CROSS/BLUE SHIELD | Admitting: Anesthesiology

## 2018-01-23 ENCOUNTER — Ambulatory Visit (HOSPITAL_COMMUNITY): Payer: BLUE CROSS/BLUE SHIELD

## 2018-01-23 ENCOUNTER — Encounter (HOSPITAL_COMMUNITY): Payer: Self-pay

## 2018-01-23 ENCOUNTER — Encounter (HOSPITAL_COMMUNITY)
Admission: RE | Disposition: A | Discharge status: Home / Self Care | Payer: Self-pay | Source: Home / Self Care | Attending: Neurosurgery

## 2018-01-23 ENCOUNTER — Observation Stay (HOSPITAL_COMMUNITY)
Admission: RE | Admit: 2018-01-23 | Discharge: 2018-01-24 | Disposition: A | Discharge status: Home / Self Care | Payer: BLUE CROSS/BLUE SHIELD | Attending: Neurosurgery | Admitting: Neurosurgery

## 2018-01-23 DIAGNOSIS — M4722 Other spondylosis with radiculopathy, cervical region: Secondary | ICD-10-CM | POA: Diagnosis not present

## 2018-01-23 DIAGNOSIS — Z8249 Family history of ischemic heart disease and other diseases of the circulatory system: Secondary | ICD-10-CM | POA: Insufficient documentation

## 2018-01-23 DIAGNOSIS — M47813 Spondylosis without myelopathy or radiculopathy, cervicothoracic region: Secondary | ICD-10-CM | POA: Diagnosis not present

## 2018-01-23 DIAGNOSIS — Z79899 Other long term (current) drug therapy: Secondary | ICD-10-CM | POA: Diagnosis not present

## 2018-01-23 DIAGNOSIS — Z87892 Personal history of anaphylaxis: Secondary | ICD-10-CM | POA: Diagnosis not present

## 2018-01-23 DIAGNOSIS — M542 Cervicalgia: Secondary | ICD-10-CM | POA: Diagnosis not present

## 2018-01-23 DIAGNOSIS — Z791 Long term (current) use of non-steroidal anti-inflammatories (NSAID): Secondary | ICD-10-CM | POA: Diagnosis not present

## 2018-01-23 DIAGNOSIS — Z880 Allergy status to penicillin: Secondary | ICD-10-CM | POA: Insufficient documentation

## 2018-01-23 DIAGNOSIS — Z853 Personal history of malignant neoplasm of breast: Secondary | ICD-10-CM | POA: Insufficient documentation

## 2018-01-23 DIAGNOSIS — I10 Essential (primary) hypertension: Secondary | ICD-10-CM | POA: Diagnosis not present

## 2018-01-23 DIAGNOSIS — M5013 Cervical disc disorder with radiculopathy, cervicothoracic region: Secondary | ICD-10-CM | POA: Diagnosis not present

## 2018-01-23 DIAGNOSIS — M1990 Unspecified osteoarthritis, unspecified site: Secondary | ICD-10-CM | POA: Insufficient documentation

## 2018-01-23 DIAGNOSIS — K648 Other hemorrhoids: Secondary | ICD-10-CM | POA: Diagnosis not present

## 2018-01-23 DIAGNOSIS — M5010 Cervical disc disorder with radiculopathy, unspecified cervical region: Secondary | ICD-10-CM | POA: Diagnosis not present

## 2018-01-23 DIAGNOSIS — M5020 Other cervical disc displacement, unspecified cervical region: Secondary | ICD-10-CM | POA: Diagnosis present

## 2018-01-23 DIAGNOSIS — M4723 Other spondylosis with radiculopathy, cervicothoracic region: Secondary | ICD-10-CM | POA: Diagnosis not present

## 2018-01-23 DIAGNOSIS — Z419 Encounter for procedure for purposes other than remedying health state, unspecified: Secondary | ICD-10-CM

## 2018-01-23 HISTORY — PX: POSTERIOR CERVICAL LAMINECTOMY: SHX2248

## 2018-01-23 SURGERY — POSTERIOR CERVICAL LAMINECTOMY
Anesthesia: General | Site: Spine Cervical | Laterality: Left

## 2018-01-23 MED ORDER — ALUM & MAG HYDROXIDE-SIMETH 200-200-20 MG/5ML PO SUSP: 30.0000 mL | Freq: Four times a day (QID) | ORAL | Status: DC | PRN

## 2018-01-23 MED ORDER — DEXAMETHASONE SODIUM PHOSPHATE 10 MG/ML IJ SOLN
INTRAMUSCULAR | Status: AC
  Filled 2018-01-23: qty 1

## 2018-01-23 MED ORDER — ONDANSETRON HCL 4 MG/2ML IJ SOLN
INTRAMUSCULAR | Status: DC | PRN
  Administered 2018-01-23: 4 mg via INTRAVENOUS

## 2018-01-23 MED ORDER — BACITRACIN ZINC 500 UNIT/GM EX OINT
TOPICAL_OINTMENT | CUTANEOUS | Status: DC | PRN
  Administered 2018-01-23: 1 via TOPICAL

## 2018-01-23 MED ORDER — VITAMIN D3 25 MCG (1000 UNIT) PO TABS
2000.0000 [IU] | ORAL_TABLET | Freq: Every day | ORAL | Status: DC
  Administered 2018-01-23: 2000 [IU] via ORAL
  Filled 2018-01-23 (×3): qty 2

## 2018-01-23 MED ORDER — HEMOSTATIC AGENTS (NO CHARGE) OPTIME
TOPICAL | Status: DC | PRN
  Administered 2018-01-23: 1 via TOPICAL

## 2018-01-23 MED ORDER — EPHEDRINE 5 MG/ML INJ
INTRAVENOUS | Status: AC
  Filled 2018-01-23: qty 10

## 2018-01-23 MED ORDER — HYDROMORPHONE HCL 1 MG/ML IJ SOLN
0.2500 mg | INTRAMUSCULAR | Status: DC | PRN
  Administered 2018-01-23 (×4): 0.5 mg via INTRAVENOUS

## 2018-01-23 MED ORDER — MORPHINE SULFATE (PF) 4 MG/ML IV SOLN: 4.0000 mg | INTRAVENOUS | Status: DC | PRN

## 2018-01-23 MED ORDER — MAGNESIUM HYDROXIDE 400 MG/5ML PO SUSP: 30.0000 mL | Freq: Every day | ORAL | Status: DC | PRN

## 2018-01-23 MED ORDER — HYDROCODONE-ACETAMINOPHEN 5-325 MG PO TABS
1.0000 | ORAL_TABLET | ORAL | Status: DC | PRN
  Administered 2018-01-23: 2 via ORAL

## 2018-01-23 MED ORDER — PHENOL 1.4 % MT LIQD: 1.0000 | OROMUCOSAL | Status: DC | PRN

## 2018-01-23 MED ORDER — SODIUM CHLORIDE 0.9 % IV SOLN
INTRAVENOUS | Status: DC | PRN
  Administered 2018-01-23: 25 ug/min via INTRAVENOUS

## 2018-01-23 MED ORDER — FLEET ENEMA 7-19 GM/118ML RE ENEM: 1.0000 | ENEMA | Freq: Once | RECTAL | Status: DC | PRN

## 2018-01-23 MED ORDER — HYDROXYZINE HCL 25 MG PO TABS: 50.0000 mg | ORAL_TABLET | ORAL | Status: DC | PRN

## 2018-01-23 MED ORDER — METHYLPREDNISOLONE ACETATE 80 MG/ML IJ SUSP
INTRAMUSCULAR | Status: DC | PRN
  Administered 2018-01-23: 40 mg

## 2018-01-23 MED ORDER — 0.9 % SODIUM CHLORIDE (POUR BTL) OPTIME
TOPICAL | Status: DC | PRN
  Administered 2018-01-23: 1000 mL

## 2018-01-23 MED ORDER — PROPOFOL 10 MG/ML IV BOLUS
INTRAVENOUS | Status: AC
  Filled 2018-01-23: qty 40

## 2018-01-23 MED ORDER — PROPOFOL 10 MG/ML IV BOLUS
INTRAVENOUS | Status: DC | PRN
  Administered 2018-01-23: 200 mg via INTRAVENOUS

## 2018-01-23 MED ORDER — BUPIVACAINE HCL (PF) 0.25 % IJ SOLN
INTRAMUSCULAR | Status: DC | PRN
  Administered 2018-01-23: 10 mL

## 2018-01-23 MED ORDER — BISACODYL 10 MG RE SUPP: 10.0000 mg | Freq: Every day | RECTAL | Status: DC | PRN

## 2018-01-23 MED ORDER — SUCCINYLCHOLINE CHLORIDE 200 MG/10ML IV SOSY
PREFILLED_SYRINGE | INTRAVENOUS | Status: AC
  Filled 2018-01-23: qty 10

## 2018-01-23 MED ORDER — PHENYLEPHRINE 40 MCG/ML (10ML) SYRINGE FOR IV PUSH (FOR BLOOD PRESSURE SUPPORT)
PREFILLED_SYRINGE | INTRAVENOUS | Status: DC | PRN
  Administered 2018-01-23: 80 ug via INTRAVENOUS

## 2018-01-23 MED ORDER — FENTANYL CITRATE (PF) 250 MCG/5ML IJ SOLN
INTRAMUSCULAR | Status: AC
  Filled 2018-01-23: qty 5

## 2018-01-23 MED ORDER — KCL IN DEXTROSE-NACL 40-5-0.45 MEQ/L-%-% IV SOLN
INTRAVENOUS | Status: DC
  Filled 2018-01-23: qty 1000

## 2018-01-23 MED ORDER — BUPIVACAINE HCL (PF) 0.25 % IJ SOLN
INTRAMUSCULAR | Status: AC
  Filled 2018-01-23: qty 30

## 2018-01-23 MED ORDER — SODIUM CHLORIDE 0.9 % IV SOLN: 250.0000 mL | INTRAVENOUS | Status: DC

## 2018-01-23 MED ORDER — TRIAMTERENE-HCTZ 37.5-25 MG PO TABS
1.0000 | ORAL_TABLET | Freq: Every day | ORAL | Status: DC
  Administered 2018-01-23: 1 via ORAL
  Filled 2018-01-23 (×2): qty 1

## 2018-01-23 MED ORDER — ACETAMINOPHEN 10 MG/ML IV SOLN
INTRAVENOUS | Status: AC
  Filled 2018-01-23: qty 100

## 2018-01-23 MED ORDER — CHLORHEXIDINE GLUCONATE CLOTH 2 % EX PADS: 6.0000 | MEDICATED_PAD | Freq: Once | CUTANEOUS | Status: DC

## 2018-01-23 MED ORDER — HYDROMORPHONE HCL 1 MG/ML IJ SOLN
INTRAMUSCULAR | Status: AC
  Filled 2018-01-23: qty 1

## 2018-01-23 MED ORDER — GENTAMICIN SULFATE 40 MG/ML IJ SOLN
INTRAVENOUS | Status: DC | PRN
  Administered 2018-01-23: 80 mg via INTRAVENOUS

## 2018-01-23 MED ORDER — LACTATED RINGERS IV SOLN
INTRAVENOUS | Status: DC
  Administered 2018-01-23 (×2): via INTRAVENOUS

## 2018-01-23 MED ORDER — VANCOMYCIN HCL IN DEXTROSE 1-5 GM/200ML-% IV SOLN
1000.0000 mg | INTRAVENOUS | Status: AC
  Administered 2018-01-23: 1000 mg via INTRAVENOUS
  Filled 2018-01-23: qty 200

## 2018-01-23 MED ORDER — PHENYLEPHRINE 40 MCG/ML (10ML) SYRINGE FOR IV PUSH (FOR BLOOD PRESSURE SUPPORT)
PREFILLED_SYRINGE | INTRAVENOUS | Status: AC
  Filled 2018-01-23: qty 10

## 2018-01-23 MED ORDER — THROMBIN 5000 UNITS EX SOLR
OROMUCOSAL | Status: DC | PRN
  Administered 2018-01-23: 5 mL via TOPICAL

## 2018-01-23 MED ORDER — CYCLOBENZAPRINE HCL 10 MG PO TABS
ORAL_TABLET | ORAL | Status: AC
  Filled 2018-01-23: qty 1

## 2018-01-23 MED ORDER — LIDOCAINE 2% (20 MG/ML) 5 ML SYRINGE
INTRAMUSCULAR | Status: AC
  Filled 2018-01-23: qty 5

## 2018-01-23 MED ORDER — KETOROLAC TROMETHAMINE 30 MG/ML IJ SOLN
30.0000 mg | Freq: Four times a day (QID) | INTRAMUSCULAR | Status: DC
  Administered 2018-01-23 – 2018-01-24 (×3): 30 mg via INTRAVENOUS
  Filled 2018-01-23 (×3): qty 1

## 2018-01-23 MED ORDER — MENTHOL 3 MG MT LOZG: 1.0000 | LOZENGE | OROMUCOSAL | Status: DC | PRN

## 2018-01-23 MED ORDER — GENTAMICIN IN SALINE 1.6-0.9 MG/ML-% IV SOLN
INTRAVENOUS | Status: AC
  Filled 2018-01-23: qty 50

## 2018-01-23 MED ORDER — DEXAMETHASONE SODIUM PHOSPHATE 10 MG/ML IJ SOLN
INTRAMUSCULAR | Status: DC | PRN
  Administered 2018-01-23: 10 mg via INTRAVENOUS

## 2018-01-23 MED ORDER — MIDAZOLAM HCL 5 MG/5ML IJ SOLN
INTRAMUSCULAR | Status: DC | PRN
  Administered 2018-01-23: 2 mg via INTRAVENOUS

## 2018-01-23 MED ORDER — ONDANSETRON HCL 4 MG/2ML IJ SOLN
INTRAMUSCULAR | Status: AC
  Filled 2018-01-23: qty 2

## 2018-01-23 MED ORDER — LIDOCAINE 20MG/ML (2%) 15 ML SYRINGE OPTIME
INTRAMUSCULAR | Status: DC | PRN
  Administered 2018-01-23: 60 mg via INTRAVENOUS

## 2018-01-23 MED ORDER — SODIUM CHLORIDE 0.9% FLUSH
3.0000 mL | Freq: Two times a day (BID) | INTRAVENOUS | Status: DC
  Administered 2018-01-23: 3 mL via INTRAVENOUS

## 2018-01-23 MED ORDER — MIDAZOLAM HCL 2 MG/2ML IJ SOLN
INTRAMUSCULAR | Status: AC
  Filled 2018-01-23: qty 2

## 2018-01-23 MED ORDER — ROCURONIUM BROMIDE 10 MG/ML (PF) SYRINGE
PREFILLED_SYRINGE | INTRAVENOUS | Status: DC | PRN
  Administered 2018-01-23: 50 mg via INTRAVENOUS
  Administered 2018-01-23: 30 mg via INTRAVENOUS
  Administered 2018-01-23: 50 mg via INTRAVENOUS

## 2018-01-23 MED ORDER — ACETAMINOPHEN 325 MG PO TABS: 650.0000 mg | ORAL_TABLET | ORAL | Status: DC | PRN

## 2018-01-23 MED ORDER — SODIUM CHLORIDE 0.9 % IV SOLN
INTRAVENOUS | Status: DC | PRN
  Administered 2018-01-23: 500 mL

## 2018-01-23 MED ORDER — ACETAMINOPHEN 650 MG RE SUPP: 650.0000 mg | RECTAL | Status: DC | PRN

## 2018-01-23 MED ORDER — SODIUM CHLORIDE 0.9% FLUSH: 3.0000 mL | INTRAVENOUS | Status: DC | PRN

## 2018-01-23 MED ORDER — THROMBIN 5000 UNITS EX SOLR
CUTANEOUS | Status: AC
  Filled 2018-01-23: qty 15000

## 2018-01-23 MED ORDER — FENTANYL CITRATE (PF) 100 MCG/2ML IJ SOLN
INTRAMUSCULAR | Status: DC | PRN
  Administered 2018-01-23 (×2): 50 ug via INTRAVENOUS

## 2018-01-23 MED ORDER — PROMETHAZINE HCL 25 MG/ML IJ SOLN: 6.2500 mg | INTRAMUSCULAR | Status: DC | PRN

## 2018-01-23 MED ORDER — THROMBIN 5000 UNITS EX SOLR
CUTANEOUS | Status: DC | PRN
  Administered 2018-01-23 (×2): 5000 [IU] via TOPICAL

## 2018-01-23 MED ORDER — HYDROCODONE-ACETAMINOPHEN 5-325 MG PO TABS
ORAL_TABLET | ORAL | Status: AC
  Filled 2018-01-23: qty 2

## 2018-01-23 MED ORDER — KETOROLAC TROMETHAMINE 30 MG/ML IJ SOLN
INTRAMUSCULAR | Status: AC
  Filled 2018-01-23: qty 1

## 2018-01-23 MED ORDER — LIDOCAINE-EPINEPHRINE 1 %-1:100000 IJ SOLN
INTRAMUSCULAR | Status: AC
  Filled 2018-01-23: qty 1

## 2018-01-23 MED ORDER — SUGAMMADEX SODIUM 200 MG/2ML IV SOLN
INTRAVENOUS | Status: DC | PRN
  Administered 2018-01-23: 199.2 mg via INTRAVENOUS

## 2018-01-23 MED ORDER — CYCLOBENZAPRINE HCL 5 MG PO TABS
5.0000 mg | ORAL_TABLET | Freq: Three times a day (TID) | ORAL | Status: DC | PRN
  Administered 2018-01-23: 10 mg via ORAL

## 2018-01-23 MED ORDER — KETOROLAC TROMETHAMINE 30 MG/ML IJ SOLN
30.0000 mg | Freq: Once | INTRAMUSCULAR | Status: AC
  Administered 2018-01-23: 30 mg via INTRAVENOUS

## 2018-01-23 MED ORDER — METHYLPREDNISOLONE ACETATE 80 MG/ML IJ SUSP
INTRAMUSCULAR | Status: AC
  Filled 2018-01-23: qty 1

## 2018-01-23 MED ORDER — BACITRACIN ZINC 500 UNIT/GM EX OINT
TOPICAL_OINTMENT | CUTANEOUS | Status: AC
  Filled 2018-01-23: qty 28.35

## 2018-01-23 MED ORDER — LIDOCAINE-EPINEPHRINE 1 %-1:100000 IJ SOLN
INTRAMUSCULAR | Status: DC | PRN
  Administered 2018-01-23: 10 mL

## 2018-01-23 MED ORDER — ACETAMINOPHEN 10 MG/ML IV SOLN
INTRAVENOUS | Status: DC | PRN
  Administered 2018-01-23: 1000 mg via INTRAVENOUS

## 2018-01-23 MED ORDER — HYDROXYZINE HCL 50 MG/ML IM SOLN: 50.0000 mg | INTRAMUSCULAR | Status: DC | PRN

## 2018-01-23 SURGICAL SUPPLY — 72 items
BAG DECANTER FOR FLEXI CONT (MISCELLANEOUS) ×2 IMPLANT
BENZOIN TINCTURE PRP APPL 2/3 (GAUZE/BANDAGES/DRESSINGS) IMPLANT
BIT DRILL WIRE PASS 1.3MM (BIT) ×1 IMPLANT
BLADE CLIPPER SURG (BLADE) IMPLANT
BLADE SURG 11 STRL SS (BLADE) ×1 IMPLANT
BLADE ULTRA TIP 2M (BLADE) ×1 IMPLANT
BUR MATCHSTICK NEURO 3.0 LAGG (BURR) ×2 IMPLANT
BUR NEURO DIAMOND 3X3.8 (BURR) IMPLANT
BURR NEURO DIAMOND 3X3.8 (BURR)
CANISTER SUCT 3000ML PPV (MISCELLANEOUS) ×2 IMPLANT
CARTRIDGE OIL MAESTRO DRILL (MISCELLANEOUS) ×1 IMPLANT
COVER WAND RF STERILE (DRAPES) ×2 IMPLANT
DERMABOND ADVANCED (GAUZE/BANDAGES/DRESSINGS) ×1
DERMABOND ADVANCED .7 DNX12 (GAUZE/BANDAGES/DRESSINGS) ×1 IMPLANT
DIFFUSER DRILL AIR PNEUMATIC (MISCELLANEOUS) ×2 IMPLANT
DRAPE C-ARM 42X72 X-RAY (DRAPES) ×1 IMPLANT
DRAPE C-ARMOR (DRAPES) ×1 IMPLANT
DRAPE LAPAROTOMY 100X72 PEDS (DRAPES) ×2 IMPLANT
DRAPE MICROSCOPE LEICA (MISCELLANEOUS) ×2 IMPLANT
DRAPE POUCH INSTRU U-SHP 10X18 (DRAPES) ×2 IMPLANT
DRILL WIRE PASS 1.3MM (BIT) ×2
ELECT REM PT RETURN 9FT ADLT (ELECTROSURGICAL) ×2
ELECTRODE REM PT RTRN 9FT ADLT (ELECTROSURGICAL) ×1 IMPLANT
GAUZE 4X4 16PLY RFD (DISPOSABLE) IMPLANT
GAUZE SPONGE 4X4 12PLY STRL (GAUZE/BANDAGES/DRESSINGS) IMPLANT
GLOVE BIO SURGEON STRL SZ 6.5 (GLOVE) ×3 IMPLANT
GLOVE BIOGEL PI IND STRL 6.5 (GLOVE) IMPLANT
GLOVE BIOGEL PI IND STRL 7.5 (GLOVE) IMPLANT
GLOVE BIOGEL PI IND STRL 8 (GLOVE) ×1 IMPLANT
GLOVE BIOGEL PI INDICATOR 6.5 (GLOVE) ×2
GLOVE BIOGEL PI INDICATOR 7.5 (GLOVE) ×1
GLOVE BIOGEL PI INDICATOR 8 (GLOVE) ×1
GLOVE ECLIPSE 7.5 STRL STRAW (GLOVE) ×2 IMPLANT
GLOVE EXAM NITRILE XL STR (GLOVE) IMPLANT
GOWN STRL REUS W/ TWL LRG LVL3 (GOWN DISPOSABLE) IMPLANT
GOWN STRL REUS W/ TWL XL LVL3 (GOWN DISPOSABLE) IMPLANT
GOWN STRL REUS W/TWL 2XL LVL3 (GOWN DISPOSABLE) IMPLANT
GOWN STRL REUS W/TWL LRG LVL3 (GOWN DISPOSABLE) ×1
GOWN STRL REUS W/TWL XL LVL3 (GOWN DISPOSABLE) ×1
HEMOSTAT POWDER KIT SURGIFOAM (HEMOSTASIS) ×1 IMPLANT
HEMOSTAT SURGICEL 2X14 (HEMOSTASIS) IMPLANT
KIT BASIN OR (CUSTOM PROCEDURE TRAY) ×2 IMPLANT
KIT TURNOVER KIT B (KITS) ×2 IMPLANT
NDL FILTER BLUNT 18X1 1/2 (NEEDLE) IMPLANT
NDL SPNL 18GX3.5 QUINCKE PK (NEEDLE) ×1 IMPLANT
NDL SPNL 22GX3.5 QUINCKE BK (NEEDLE) ×2 IMPLANT
NEEDLE FILTER BLUNT 18X 1/2SAF (NEEDLE) ×1
NEEDLE FILTER BLUNT 18X1 1/2 (NEEDLE) ×1 IMPLANT
NEEDLE SPNL 18GX3.5 QUINCKE PK (NEEDLE) ×4 IMPLANT
NEEDLE SPNL 22GX3.5 QUINCKE BK (NEEDLE) ×2 IMPLANT
NS IRRIG 1000ML POUR BTL (IV SOLUTION) ×2 IMPLANT
OIL CARTRIDGE MAESTRO DRILL (MISCELLANEOUS) ×2
PACK LAMINECTOMY NEURO (CUSTOM PROCEDURE TRAY) ×2 IMPLANT
PAD ARMBOARD 7.5X6 YLW CONV (MISCELLANEOUS) ×6 IMPLANT
PATTIES SURGICAL .25X.25 (GAUZE/BANDAGES/DRESSINGS) IMPLANT
PIN MAYFIELD SKULL DISP (PIN) ×2 IMPLANT
RUBBERBAND STERILE (MISCELLANEOUS) ×4 IMPLANT
SPONGE LAP 4X18 RFD (DISPOSABLE) IMPLANT
SPONGE SURGIFOAM ABS GEL SZ50 (HEMOSTASIS) ×2 IMPLANT
STAPLER SKIN PROX WIDE 3.9 (STAPLE) ×2 IMPLANT
STRIP CLOSURE SKIN 1/4X4 (GAUZE/BANDAGES/DRESSINGS) IMPLANT
SUT VIC AB 0 CT1 18XCR BRD8 (SUTURE) ×1 IMPLANT
SUT VIC AB 0 CT1 8-18 (SUTURE) ×2
SUT VIC AB 2-0 CP2 18 (SUTURE) ×2 IMPLANT
SUT VIC AB 3-0 SH 8-18 (SUTURE) ×2 IMPLANT
SYR 3ML LL SCALE MARK (SYRINGE) ×1 IMPLANT
SYRINGE 1CC SLIP TB (MISCELLANEOUS) ×1 IMPLANT
TOWEL GREEN STERILE (TOWEL DISPOSABLE) ×2 IMPLANT
TOWEL GREEN STERILE FF (TOWEL DISPOSABLE) ×2 IMPLANT
TRAY FOLEY MTR SLVR 16FR STAT (SET/KITS/TRAYS/PACK) IMPLANT
UNDERPAD 30X30 (UNDERPADS AND DIAPERS) IMPLANT
WATER STERILE IRR 1000ML POUR (IV SOLUTION) ×2 IMPLANT

## 2018-01-23 NOTE — H&P (Signed)
Subjective: Patient is a 63 y.o. right-handed white female who is admitted for treatment of large left C7-T1 cervical disc herniation that is extended rostrally behind the body of C7, resulting in a significant left C8 radiculopathy with weakness, pain, and numbness.  Patient has been having symptoms since mid October with numbness, occasional tingling, and pain through the left upper extremity.  She is had numbness and occasional tingling in the left fourth and fifth digits of the hand as well as in the medial aspect of the left hand extending into the medial and posterior aspect of the left forearm and arm.  The pain stands into the left upper extremity behind the left shoulder and into the posterior aspect of the left arm and forearm.  X-rays and MRI scan show multilevel cervical spondylosis and degenerative disc disease however the most notable finding is of a large left C7-T1 cervical disc herniation with a free fragment is migrated rostrally behind the body of C7.  She is admitted now for a left C7-T1 cervical laminotomy, foraminotomy, and microdiscectomy.   Patient Active Problem List   Diagnosis Date Noted  . Knee problem 03/22/2017  . BRCA2 positive 10/03/2016  . Genetic testing 09/28/2016  . History of adenomatous polyp of colon 09/19/2016  . Internal hemorrhoids 09/19/2016  . Family history of breast cancer   . Family history of prostate cancer   . Colon polyps   . Breast cancer ([place]) 01/22/1998   Past Medical History:  Diagnosis Date  . Arthritis   . Breast cancer ([place]) 2000   Stage 2B, ER+/PR-/Her2-  . Colon polyps   . Family history of breast cancer   . Family history of prostate cancer   . Hypertension     Past Surgical History:  Procedure Laterality Date  . ABDOMINAL HYSTERECTOMY    . COLONOSCOPY WITH PROPOFOL     pt said propofol burned her throat when pushed  . MASTECTOMY Right 2000  . ROTATOR CUFF REPAIR    . TONSILLECTOMY    . TUBAL LIGATION    . WISDOM TOOTH  EXTRACTION      Medications Prior to Admission  Medication Sig Dispense Refill Last Dose  . celecoxib (CELEBREX) 200 MG capsule Take 200 mg by mouth daily.    Past Month at Unknown time  . Cholecalciferol (VITAMIN D3) 2000 units capsule Take 2,000 Units by mouth daily.    Past Month at Unknown time  . ELDERBERRY PO Take 50 mg by mouth daily. 25 mg per capsule   Past Week at Unknown time  . Lutein 20 MG TABS Take 20 mg by mouth daily.    Past Week at Unknown time  . Multiple Vitamins-Minerals (MULTIVITAMIN WITH MINERALS) tablet Take 1 tablet by mouth daily.    Past Month at Unknown time  . Omega-3 1000 MG CAPS Take 1,000 mg by mouth daily.    Past Month at Unknown time  . Potassium 99 MG TABS Take 99 mg by mouth daily.   Past Week at Unknown time  . triamterene-hydrochlorothiazide (MAXZIDE-25) 37.5-25 MG tablet Take 1 tablet by mouth daily.    01/22/2018 at Unknown time   Allergies  Allergen Reactions  . Penicillins Anaphylaxis    DID THE REACTION INVOLVE: Swelling of the face/tongue/throat, SOB, or low BP? Yes Sudden or severe rash/hives, skin peeling, or the inside of the mouth or nose? No Did it require medical treatment? Yes When did it last happen?63 years ago If all above answers are "NO", may  proceed with cephalosporin use.     Social History   Tobacco Use  . Smoking status: Never Smoker  . Smokeless tobacco: Never Used  Substance Use Topics  . Alcohol use: No    Family History  Problem Relation Age of Onset  . Breast cancer Paternal Grandmother 70  . Prostate cancer Father        dx in his 42s  . Colon polyps Father 48  . Dementia Mother   . Hypertension Mother   . Dementia Maternal Aunt   . Stroke Maternal Grandfather   . Rheum arthritis Paternal Grandfather   . Breast cancer Other        MGM's sister, post-menopausal breast cancer  . Breast cancer Cousin 70       paternal 2nd cousin     Review of Systems Pertinent items noted in HPI and remainder of  comprehensive ROS otherwise negative.  Objective: Vital signs in last 24 hours: Temp:  [98.7 F (37.1 C)] 98.7 F (37.1 C) (01/02 0553) Pulse Rate:  [79] 79 (01/02 0553) Resp:  [18] 18 (01/02 0553) BP: (150)/(89) 150/89 (01/02 0553) SpO2:  [99 %] 99 % (01/02 0553) Weight:  [99.6 kg] 99.6 kg (01/02 0619)  EXAM: Patient is a well-developed well-nourished white female in no acute distress.   Lungs are clear to auscultation , the patient has symmetrical respiratory excursion. Heart has a regular rate and rhythm normal S1 and S2 no murmur.   Abdomen is soft nontender nondistended bowel sounds are present. Extremity examination shows no clubbing cyanosis or edema. Motor examination shows 5/5 strength in the deltoid, biceps, triceps, and grips bilaterally as well as the right intrinsics.  However the left intrinsics, including the finger abductors, are 4/5.  Sensation is decreased to pinprick in the fourth and fifth digit of the left hand.  In the fourth digit, it is decreased more medially than laterally.  Reflexes are minimal to absent in the biceps, brachioradialis, triceps, quadriceps, and gastrocnemius.  They are symmetrical.  Toes are downgoing.  She has normal gait and stance.  Data Review:CBC    Component Value Date/Time   WBC 7.5 01/17/2018 1401   RBC 4.38 01/17/2018 1401   HGB 13.4 01/17/2018 1401   HCT 40.3 01/17/2018 1401   PLT 299 01/17/2018 1401   MCV 92.0 01/17/2018 1401   MCH 30.6 01/17/2018 1401   MCHC 33.3 01/17/2018 1401   RDW 13.2 01/17/2018 1401                          BMET    Component Value Date/Time   NA 142 01/17/2018 1401   K 3.4 (L) 01/17/2018 1401   CL 106 01/17/2018 1401   CO2 26 01/17/2018 1401   GLUCOSE 112 (H) 01/17/2018 1401   BUN 11 01/17/2018 1401   CREATININE 0.97 01/17/2018 1401   CALCIUM 9.3 01/17/2018 1401   GFRNONAA >60 01/17/2018 1401   GFRAA >60 01/17/2018 1401     Assessment/Plan: Patient with left C8 cervical radiculopathy with  weakness of the intrinsics of the left hand 4/5, decreased sensation in the left fourth and fifth digits secondary to a large left C7-T1 cervical disc condition with a free fragment migrated rostrally behind the body of C7.  She is admitted now for a left C7-T1 cervical laminotomy, foraminotomy, and microdiscectomy.  I've discussed with the patient the nature of his condition, the nature the surgical procedure, the typical length of surgery, hospital  stay, and overall recuperation. We discussed limitations postoperatively. I discussed risks of surgery including risks of infection, bleeding, possibly need for transfusion, the risk of nerve root dysfunction with pain, weakness, numbness, or paresthesias, the risk of spinal cord dysfunction with paralysis of all 4 limbs and quadriplegia, and the risk of dural tear and CSF leakage and possible need for further surgery, risk of instability of the cervical spine and possible need for surgery, and the risk of anesthetic complications including myocardial infarction, stroke, pneumonia, and death. We also discussed the need for postoperative immobilization in a cervical collar. Understanding all this the patient does wish to proceed with surgery and is admitted for such.    Hosie Spangle, MD 01/23/2018 7:17 AM

## 2018-01-23 NOTE — Anesthesia Procedure Notes (Signed)
Procedure Name: Intubation Date/Time: 01/23/2018 7:32 AM Performed by: Lowella Dell, CRNA Pre-anesthesia Checklist: Patient identified, Emergency Drugs available, Suction available and Patient being monitored Patient Re-evaluated:Patient Re-evaluated prior to induction Oxygen Delivery Method: Circle System Utilized Preoxygenation: Pre-oxygenation with 100% oxygen Induction Type: IV induction Ventilation: Mask ventilation without difficulty Laryngoscope Size: Glidescope and 3 Grade View: Grade I Tube type: Oral Tube size: 7.0 mm Number of attempts: 1 Airway Equipment and Method: Video-laryngoscopy and Rigid stylet Placement Confirmation: ETT inserted through vocal cords under direct vision,  positive ETCO2 and breath sounds checked- equal and bilateral Secured at: 21 cm Tube secured with: Tape Dental Injury: Teeth and Oropharynx as per pre-operative assessment  Comments: *Elective glidescope d/t pt reporting neck/arm symptoms with ROM* Pt head/neck maintained in neutral position throughout MV and intubation.

## 2018-01-23 NOTE — Progress Notes (Signed)
Vitals:   01/23/18 1052 01/23/18 1107 01/23/18 1141 01/23/18 1554  BP: (!) 147/79 (!) 146/87 (!) 145/87 (!) 166/84  Pulse: 71 79 77 73  Resp: 10 16 16 16   Temp:   97.6 F (36.4 C) 97.7 F (36.5 C)  TempSrc:   Oral Oral  SpO2: 100% 96% 100% 100%  Weight:      Height:        Patient sitting up in chair, eating dinner.  Has been up and ambulating.  Has voided.  Incision clean and dry; no erythema, ecchymosis, swelling, or drainage.  Plan: Doing well following surgery.  Encouraged to ambulate.  Continue to progress through postoperative recovery.  Hosie Spangle, MD 01/23/2018, 5:51 PM

## 2018-01-23 NOTE — Anesthesia Postprocedure Evaluation (Signed)
Anesthesia Post Note  Patient: Barbara Rhodes  Procedure(s) Performed: LEFT CERVICAL SEVEN- THORACIC ONE LAMINOTOMY,FORAMINOTOMY, MICRODISCECTOMY (Left Spine Cervical)     Patient location during evaluation: PACU Anesthesia Type: General Level of consciousness: awake and alert Pain management: pain level controlled Vital Signs Assessment: post-procedure vital signs reviewed and stable Respiratory status: spontaneous breathing, nonlabored ventilation, respiratory function stable and patient connected to nasal cannula oxygen Cardiovascular status: blood pressure returned to baseline and stable Postop Assessment: no apparent nausea or vomiting Anesthetic complications: no    Last Vitals:  Vitals:   01/23/18 1107 01/23/18 1141  BP: (!) 146/87 (!) 145/87  Pulse: 79 77  Resp: 16 16  Temp:  36.4 C  SpO2: 96% 100%    Last Pain:  Vitals:   01/23/18 1141  TempSrc: Oral  PainSc:                  Tiajuana Amass

## 2018-01-23 NOTE — Transfer of Care (Signed)
Immediate Anesthesia Transfer of Care Note  Patient: Barbara Rhodes  Procedure(s) Performed: LEFT CERVICAL SEVEN- THORACIC ONE LAMINOTOMY,FORAMINOTOMY, MICRODISCECTOMY (Left Spine Cervical)  Patient Location: PACU  Anesthesia Type:General  Level of Consciousness: awake and patient cooperative  Airway & Oxygen Therapy: Patient Spontanous Breathing and Patient connected to nasal cannula oxygen  Post-op Assessment: Report given to RN and Post -op Vital signs reviewed and stable  Post vital signs: Reviewed and stable  Last Vitals:  Vitals Value Taken Time  BP 126/78 01/23/2018 10:07 AM  Temp 36.4 C 01/23/2018 10:09 AM  Pulse 79 01/23/2018 10:09 AM  Resp 15 01/23/2018 10:09 AM  SpO2 100 % 01/23/2018 10:09 AM  Vitals shown include unvalidated device data.  Last Pain:  Vitals:   01/23/18 1009  TempSrc:   PainSc: (P) 0-No pain      Patients Stated Pain Goal: 3 (94/17/40 8144)  Complications: No apparent anesthesia complications

## 2018-01-23 NOTE — Op Note (Signed)
01/23/2018  9:50 AM  PATIENT:  Barbara Rhodes  63 y.o. female  PRE-OPERATIVE DIAGNOSIS: Left C7-T1 cervical disc herniation, cervical thoracic spondylosis, cervical thoracic degenerative disc disease, left C8 cervical radiculopathy  POST-OPERATIVE DIAGNOSIS:  Left C7-T1 cervical disc herniation, cervical thoracic spondylosis, cervical thoracic degenerative disc disease, left C8 cervical radiculopathy  PROCEDURE:  Procedure(s):  LEFT CERVICAL SEVEN- THORACIC ONE LAMINOTOMY,FORAMINOTOMY, MICRODISCECTOMY with microdissection, microsurgical technique, and the operating microscope  SURGEON: Jovita Gamma, MD  ASSISTANTS: Sherley Bounds, MD  ANESTHESIA:   general  EBL:  Total I/O In: 1000 [I.V.:1000] Out: 50 [Blood:50]  BLOOD ADMINISTERED:none  COUNT:  Correct per nursing staff  DICTATION: Patient was brought to the operating room, placed under general endotracheal anesthesia.  The Mayfield head holder was applied, and the patient was turned to a prone position.  The neck and upper back were prepped with Betadine soap and solution and draped in sterile fashion.  The midline was infiltrated with local anesthetic with epinephrine.  An x-ray was taken and the C7-T1 level identified.  Midline incision was made over the C7-T1 level, and carried down through the subcutaneous tissue.  Bipolar cautery and electrocautery used to maintain hemostasis.  The cervical thoracic fascia was incised in the left side of the midline and the para spinal musculature was dissected from the spinous process lamina in a subperiosteal fashion.  A self-retaining retractor was placed, and another localizing x-ray was taken and the C7-T1 interlaminar space was identified.  The operating microscope was draped and brought in the field to provide additional magnification, illumination, and visualization, and the decompression was performed using microdissection and microsurgical technique.  Using the high-speed drill and a 2 mm  Kerrison punch with a thin footplate a left L3-T3 laminotomy was performed, it was extended more rostrally.  The ligamentum flavum was carefully removed exposing the lateral aspect of the thecal sac and the exiting left C8 nerve root.  Foraminotomy was performed for the left C8 nerve root.  The epidural space was explored and the free fragment disc condition was identified rostral to the disc space.  The remaining ligamentous fibers were opened, and a large free fragment disc herniation was removed in a piecemeal fashion.  All loose fragments of disc material were removed, and the ventral epidural space was carefully examined to ensure the good decompression of the thecal sac and exiting left C8 nerve root was achieved, and that all loose fragments of disc material had been removed.  This was confirmed, and we then establish hemostasis initially with Gelfoam with thrombin, the Gelfoam was removed, and then a thin layer of Surgifoam was applied.  We instilled 40 mg of Depo-Medrol into the epidural space around the left C8 nerve root.  We then proceeded with closure.  Deep fascia was closed with interrupted undyed 0 Vicryl sutures, Scarpa's fascia was closed interrupted inverted 0 and 2-0 undyed Vicryl sutures, and the subcutaneous and subcuticular layer closed with interrupted inverted 2-0 undyed Vicryl sutures.  Skin edges were approximated with Dermabond.  Following surgery the patient was turned back to a supine position, the 3 pin Mayfield head holder was removed, and she is to be reversed from the anesthetic, extubated, and transferred to the recovery room for further care.  PLAN OF CARE: Admit for overnight observation  PATIENT DISPOSITION:  PACU - hemodynamically stable.   Delay start of Pharmacological VTE agent (>24hrs) due to surgical blood loss or risk of bleeding:  yes

## 2018-01-24 ENCOUNTER — Encounter (HOSPITAL_COMMUNITY): Payer: Self-pay | Admitting: Neurosurgery

## 2018-01-24 DIAGNOSIS — M5010 Cervical disc disorder with radiculopathy, unspecified cervical region: Secondary | ICD-10-CM | POA: Diagnosis not present

## 2018-01-24 DIAGNOSIS — Z79899 Other long term (current) drug therapy: Secondary | ICD-10-CM | POA: Diagnosis not present

## 2018-01-24 DIAGNOSIS — M5013 Cervical disc disorder with radiculopathy, cervicothoracic region: Secondary | ICD-10-CM | POA: Diagnosis not present

## 2018-01-24 DIAGNOSIS — Z87892 Personal history of anaphylaxis: Secondary | ICD-10-CM | POA: Diagnosis not present

## 2018-01-24 DIAGNOSIS — Z880 Allergy status to penicillin: Secondary | ICD-10-CM | POA: Diagnosis not present

## 2018-01-24 DIAGNOSIS — M1990 Unspecified osteoarthritis, unspecified site: Secondary | ICD-10-CM | POA: Diagnosis not present

## 2018-01-24 DIAGNOSIS — Z791 Long term (current) use of non-steroidal anti-inflammatories (NSAID): Secondary | ICD-10-CM | POA: Diagnosis not present

## 2018-01-24 DIAGNOSIS — Z853 Personal history of malignant neoplasm of breast: Secondary | ICD-10-CM | POA: Diagnosis not present

## 2018-01-24 DIAGNOSIS — M47813 Spondylosis without myelopathy or radiculopathy, cervicothoracic region: Secondary | ICD-10-CM | POA: Diagnosis not present

## 2018-01-24 DIAGNOSIS — Z8249 Family history of ischemic heart disease and other diseases of the circulatory system: Secondary | ICD-10-CM | POA: Diagnosis not present

## 2018-01-24 MED ORDER — HYDROCODONE-ACETAMINOPHEN 5-325 MG PO TABS: 1.0000 | ORAL_TABLET | ORAL | 0 refills | Status: DC | PRN

## 2018-01-24 NOTE — Progress Notes (Signed)
Patient alert and oriented, mae's well, voiding adequate amount of urine, swallowing without difficulty, no c/o pain at time of discharge. Patient discharged home with family. Script and discharged instructions given to patient. Patient and family stated understanding of instructions given. Patient has an appointment with Dr. Nudelman 

## 2018-01-24 NOTE — Discharge Summary (Signed)
Physician Discharge Summary  Patient ID: Barbara Rhodes MRN: [id number] DOB/AGE: Jun 01, 1955 63 y.o.  Admit date: 01/23/2018 Discharge date: 01/24/2018  Admission Diagnoses:  Left C7-T1 cervical disc herniation, cervical thoracic spondylosis, cervical thoracic degenerative disc disease, left C8 cervical radiculopathy  Discharge Diagnoses:  Left C7-T1 cervical disc herniation, cervical thoracic spondylosis, cervical thoracic degenerative disc disease, left C8 cervical radiculopathy Active Problems:   HNP (herniated nucleus pulposus), cervical   Discharged Condition: good  Hospital Course: Patient was admitted, underwent a left C7-T1 laminotomy, foraminotomy, and microdiscectomy.  She has done well following surgery.  Her left cervical radicular symptoms are significantly improved.  She still has some mild numbness.  Her incision is healing nicely.  She is up and ambulating actively.  We are discharging her to home with instructions regarding wound care and activities.  She is scheduled to follow-up with me in 3 weeks.  Discharge Exam: Blood pressure 130/73, pulse 75, temperature (!) 97.5 F (36.4 C), temperature source Oral, resp. rate 18, height 5\' 6"  (1.676 m), weight 99.6 kg, SpO2 96 %.  Disposition: Discharge disposition: 01-Home or Self Care       Discharge Instructions    Discharge wound care:   Complete by:  As directed    Leave the wound open to air. Shower daily with the wound uncovered. Water and soapy water should run over the incision area. Do not wash directly on the incision for 2 weeks. Remove the glue after 2 weeks.   Driving Restrictions   Complete by:  As directed    No driving for 2 weeks. May ride in the car locally now. May begin to drive locally in 2 weeks.   Other Restrictions   Complete by:  As directed    Walk gradually increasing distances out in the fresh air at least twice a day. Walking additional 6 times inside the house, gradually increasing distances,  daily. No bending, lifting, or twisting. Perform activities between shoulder and waist height (that is at counter height when standing or table height when sitting).     Allergies as of 01/24/2018      Reactions   Penicillins Anaphylaxis   DID THE REACTION INVOLVE: Swelling of the face/tongue/throat, SOB, or low BP? Yes Sudden or severe rash/hives, skin peeling, or the inside of the mouth or nose? No Did it require medical treatment? Yes When did it last happen?63 years ago If all above answers are "NO", may proceed with cephalosporin use.      Medication List    TAKE these medications   celecoxib 200 MG capsule Commonly known as:  CELEBREX Take 200 mg by mouth daily.   ELDERBERRY PO Take 50 mg by mouth daily. 25 mg per capsule   HYDROcodone-acetaminophen 5-325 MG tablet Commonly known as:  NORCO/VICODIN Take 1-2 tablets by mouth every 4 (four) hours as needed (pain).   Lutein 20 MG Tabs Take 20 mg by mouth daily.   multivitamin with minerals tablet Take 1 tablet by mouth daily.   Omega-3 1000 MG Caps Take 1,000 mg by mouth daily.   Potassium 99 MG Tabs Take 99 mg by mouth daily.   triamterene-hydrochlorothiazide 37.5-25 MG tablet Commonly known as:  MAXZIDE-25 Take 1 tablet by mouth daily.   Vitamin D3 50 MCG (2000 UT) capsule Take 2,000 Units by mouth daily.            Discharge Care Instructions  (From admission, onward)         Start  Ordered   01/24/18 0000  Discharge wound care:    Comments:  Leave the wound open to air. Shower daily with the wound uncovered. Water and soapy water should run over the incision area. Do not wash directly on the incision for 2 weeks. Remove the glue after 2 weeks.   01/24/18 0753           Signed: Hosie Spangle 01/24/2018, 7:54 AM

## 2018-02-14 DIAGNOSIS — M5412 Radiculopathy, cervical region: Secondary | ICD-10-CM | POA: Diagnosis not present

## 2018-03-14 DIAGNOSIS — C50911 Malignant neoplasm of unspecified site of right female breast: Secondary | ICD-10-CM | POA: Diagnosis not present

## 2018-03-17 DIAGNOSIS — M85851 Other specified disorders of bone density and structure, right thigh: Secondary | ICD-10-CM | POA: Diagnosis not present

## 2018-03-17 DIAGNOSIS — M810 Age-related osteoporosis without current pathological fracture: Secondary | ICD-10-CM | POA: Diagnosis not present

## 2018-03-26 ENCOUNTER — Ambulatory Visit (INDEPENDENT_AMBULATORY_CARE_PROVIDER_SITE_OTHER): Payer: BLUE CROSS/BLUE SHIELD | Admitting: Podiatry

## 2018-03-26 ENCOUNTER — Encounter: Payer: Self-pay | Admitting: Podiatry

## 2018-03-26 DIAGNOSIS — M79674 Pain in right toe(s): Secondary | ICD-10-CM

## 2018-03-26 DIAGNOSIS — M722 Plantar fascial fibromatosis: Secondary | ICD-10-CM | POA: Diagnosis not present

## 2018-03-26 DIAGNOSIS — D3610 Benign neoplasm of peripheral nerves and autonomic nervous system, unspecified: Secondary | ICD-10-CM

## 2018-03-26 DIAGNOSIS — B351 Tinea unguium: Secondary | ICD-10-CM

## 2018-03-26 DIAGNOSIS — T451X5A Adverse effect of antineoplastic and immunosuppressive drugs, initial encounter: Secondary | ICD-10-CM

## 2018-03-26 DIAGNOSIS — M79675 Pain in left toe(s): Secondary | ICD-10-CM

## 2018-03-26 DIAGNOSIS — G620 Drug-induced polyneuropathy: Secondary | ICD-10-CM

## 2018-03-26 NOTE — Progress Notes (Signed)
Subjective: Barbara Rhodes presents today with history of neuropathy with cc of painful, mycotic toenails.  Pain is aggravated when wearing enclosed shoe gear and relieved with periodic professional debridement.  She relates she has had neck surgery since her last visit.  She had surgery performed on C7-T1 and she is recovering well from that.  Barbara Rhodes also requesting a new pair of orthotics which helped to manage her painful neuroma and pain of painful plantar fasciitis symptoms.  Her current pair is 65 years old.  She states without her orthotics, her pain symptoms return.  Barbara Kaufman, PA-C is her PCP.   Current Outpatient Medications:  .  celecoxib (CELEBREX) 200 MG capsule, Take 200 mg by mouth daily. , Disp: , Rfl:  .  Cholecalciferol (VITAMIN D3) 2000 units capsule, Take 2,000 Units by mouth daily. , Disp: , Rfl:  .  ELDERBERRY PO, Take 50 mg by mouth daily. 25 mg per capsule, Disp: , Rfl:  .  HYDROcodone-acetaminophen (NORCO/VICODIN) 5-325 MG tablet, Take 1-2 tablets by mouth every 4 (four) hours as needed (pain)., Disp: 30 tablet, Rfl: 0 .  Lutein 20 MG TABS, Take 20 mg by mouth daily. , Disp: , Rfl:  .  Multiple Vitamins-Minerals (MULTIVITAMIN WITH MINERALS) tablet, Take 1 tablet by mouth daily. , Disp: , Rfl:  .  Omega-3 1000 MG CAPS, Take 1,000 mg by mouth daily. , Disp: , Rfl:  .  Potassium 99 MG TABS, Take 99 mg by mouth daily., Disp: , Rfl:  .  triamterene-hydrochlorothiazide (MAXZIDE-25) 37.5-25 MG tablet, Take 1 tablet by mouth daily. , Disp: , Rfl:   Allergies  Allergen Reactions  . Penicillins Anaphylaxis    DID THE REACTION INVOLVE: Swelling of the face/tongue/throat, SOB, or low BP? Yes Sudden or severe rash/hives, skin peeling, or the inside of the mouth or nose? No Did it require medical treatment? Yes When did it last happen?63 years ago If all above answers are "NO", may proceed with cephalosporin use.     Objective:  Vascular  Examination: Capillary refill time immediate x 10 digits.  Dorsalis pedis and Posterior tibial pulses palpable bilaterally.    Digital hair x 10 digits was present bilaterally.    Skin temperature gradient WNL b/l.  Dermatological Examination: Skin with normal turgor, texture and tone b/l.  Toenails 1-5 b/l discolored, thick, dystrophic with subungual debris and pain with palpation to nailbeds due to thickness of nails.  Musculoskeletal: Muscle strength 5/5 to all muscle groups b/l.  Hallux limitus first MPJ bilaterally.  Neurological: Sensation with 10 gram monofilament is absent b/l Vibratory sensation absent b/l  Assessment: 1. Painful onychomycosis toenails 1-5 b/l. 2. Painful plantar fasciitis manageable with orthotic therapy 3. Painful neuroma right foot manageable with orthotic therapy 4. Peripheral neuropathy  Plan: 1. Toenails 1-5 b/l were debrided in length and girth without iatrogenic bleeding. 2. Patient was casted for orthotics on today's visit.  This is to manage her painful bilateral plantar fasciitis and neuroma of the right foot. 3. Patient to continue soft, supportive shoe gear 4. Patient to report any pedal injuries to medical professional  5. Follow up 3 months.  6. Patient/POA to call should there be a concern in the interim.

## 2018-03-26 NOTE — Patient Instructions (Signed)

## 2018-04-03 DIAGNOSIS — C50911 Malignant neoplasm of unspecified site of right female breast: Secondary | ICD-10-CM | POA: Diagnosis not present

## 2018-04-18 DIAGNOSIS — Z9889 Other specified postprocedural states: Secondary | ICD-10-CM | POA: Diagnosis not present

## 2018-04-28 DIAGNOSIS — D2262 Melanocytic nevi of left upper limb, including shoulder: Secondary | ICD-10-CM | POA: Diagnosis not present

## 2018-04-28 DIAGNOSIS — D2261 Melanocytic nevi of right upper limb, including shoulder: Secondary | ICD-10-CM | POA: Diagnosis not present

## 2018-04-28 DIAGNOSIS — L570 Actinic keratosis: Secondary | ICD-10-CM | POA: Diagnosis not present

## 2018-04-28 DIAGNOSIS — Z85828 Personal history of other malignant neoplasm of skin: Secondary | ICD-10-CM | POA: Diagnosis not present

## 2018-04-28 DIAGNOSIS — D225 Melanocytic nevi of trunk: Secondary | ICD-10-CM | POA: Diagnosis not present

## 2018-04-29 ENCOUNTER — Telehealth: Payer: Self-pay | Admitting: Podiatry

## 2018-04-29 NOTE — Telephone Encounter (Signed)
Returned pts call and orthotics are in and ok to mail to pt with instructions. Pt aware if any issues with them to call to schedule an appt with Liliane Channel

## 2018-06-06 DIAGNOSIS — T23209A Burn of second degree of unspecified hand, unspecified site, initial encounter: Secondary | ICD-10-CM | POA: Diagnosis not present

## 2018-06-06 DIAGNOSIS — Z6837 Body mass index (BMI) 37.0-37.9, adult: Secondary | ICD-10-CM | POA: Diagnosis not present

## 2018-06-09 ENCOUNTER — Ambulatory Visit: Payer: BLUE CROSS/BLUE SHIELD | Admitting: Podiatry

## 2018-06-09 ENCOUNTER — Other Ambulatory Visit: Payer: Self-pay

## 2018-06-09 ENCOUNTER — Encounter: Payer: Self-pay | Admitting: Podiatry

## 2018-06-09 VITALS — Temp 97.7°F

## 2018-06-09 DIAGNOSIS — M79674 Pain in right toe(s): Secondary | ICD-10-CM | POA: Diagnosis not present

## 2018-06-09 DIAGNOSIS — B351 Tinea unguium: Secondary | ICD-10-CM

## 2018-06-09 DIAGNOSIS — M79675 Pain in left toe(s): Secondary | ICD-10-CM | POA: Diagnosis not present

## 2018-06-09 NOTE — Patient Instructions (Signed)

## 2018-06-09 NOTE — Progress Notes (Signed)
Subjective: Barbara Rhodes presents today with painful, thick toenails 1-5 b/l that she cannot cut and which interfere with daily activities.  Pain is aggravated when wearing enclosed shoe gear.  Patient states she broke off a couple of her toenails after she hit the bedpost with her left foot. She relates nailplate of left hallux and left 4th toe cracked. She denies any bruising or swelling. No breaks in skin.   She continues to use Formula 3 on her nails daily.  Barbara Kaufman, PA-C is her PCP.   Current Outpatient Medications:  .  celecoxib (CELEBREX) 200 MG capsule, Take 200 mg by mouth daily. , Disp: , Rfl:  .  Cholecalciferol (VITAMIN D3) 2000 units capsule, Take 2,000 Units by mouth daily. , Disp: , Rfl:  .  ELDERBERRY PO, Take 50 mg by mouth daily. 25 mg per capsule, Disp: , Rfl:  .  HYDROcodone-acetaminophen (NORCO/VICODIN) 5-325 MG tablet, Take 1-2 tablets by mouth every 4 (four) hours as needed (pain)., Disp: 30 tablet, Rfl: 0 .  Lutein 20 MG TABS, Take 20 mg by mouth daily. , Disp: , Rfl:  .  Multiple Vitamins-Minerals (MULTIVITAMIN WITH MINERALS) tablet, Take 1 tablet by mouth daily. , Disp: , Rfl:  .  Omega-3 1000 MG CAPS, Take 1,000 mg by mouth daily. , Disp: , Rfl:  .  Potassium 99 MG TABS, Take 99 mg by mouth daily., Disp: , Rfl:  .  triamterene-hydrochlorothiazide (MAXZIDE-25) 37.5-25 MG tablet, Take 1 tablet by mouth daily. , Disp: , Rfl:   Allergies  Allergen Reactions  . Penicillins Anaphylaxis    DID THE REACTION INVOLVE: Swelling of the face/tongue/throat, SOB, or low BP? Yes Sudden or severe rash/hives, skin peeling, or the inside of the mouth or nose? No Did it require medical treatment? Yes When did it last happen?63 years ago If all above answers are "NO", may proceed with cephalosporin use.     Objective:  Vascular Examination: Capillary refill time immediate x 10 digits.  Dorsalis pedis and Posterior tibial pulses palpable  b/l.  Digital hair present x 10 digits.  Skin temperature gradient WNL b/l.  Dermatological Examination: Skin with normal turgor, texture and tone b/l.  Toenails 1-5 b/l mildly discolored, thick, dystrophic with subungual debris and pain with palpation to nailbeds due to thickness of nails.  Cracked nailplate left hallux medial border and left 4th digit medial border. No breaks in skin, no erythema, no ecchymosis, no edema, no warmth.  Musculoskeletal: Muscle strength 5/5 to all LE muscle groups.  Hallux limitus 1st MPJ b/l.  No pain, crepitus or joint limitation noted with ROM.   Neurological: Sensation absent with 10 gram monofilament.  Vibratory sensation absent b/l.  Assessment: 1.  Painful onychomycosis toenails 1-5 b/l    Plan: 1. Toenails 1-5 b/l were debrided in length and girth without iatrogenic bleeding.Offending nail border left great toe, left 4th digits debrided and curretaged Borders cleansed with alcohol. No further treatment required by patient. She is to monitor for development of ingrown toenails with these two digits. 2. Patient to continue soft, supportive shoe gear daily. 3. Patient to report any pedal injuries to medical professional immediately. 4. Follow up 3 months.  5. Patient/POA to call should there be a concern in the interim.

## 2018-06-19 ENCOUNTER — Ambulatory Visit: Payer: BLUE CROSS/BLUE SHIELD | Admitting: Podiatry

## 2018-07-01 DIAGNOSIS — M5030 Other cervical disc degeneration, unspecified cervical region: Secondary | ICD-10-CM | POA: Diagnosis not present

## 2018-07-01 DIAGNOSIS — M5412 Radiculopathy, cervical region: Secondary | ICD-10-CM | POA: Diagnosis not present

## 2018-07-01 DIAGNOSIS — Z9889 Other specified postprocedural states: Secondary | ICD-10-CM | POA: Diagnosis not present

## 2018-07-01 DIAGNOSIS — M47812 Spondylosis without myelopathy or radiculopathy, cervical region: Secondary | ICD-10-CM | POA: Diagnosis not present

## 2018-07-01 DIAGNOSIS — M542 Cervicalgia: Secondary | ICD-10-CM | POA: Diagnosis not present

## 2018-07-04 IMAGING — MG DIGITAL SCREENING UNILATERAL LEFT MAMMOGRAM WITH CAD
2 series · 2 of 2 positions shown · non-contrast
Comparison: Previous exam(s).

CLINICAL DATA: Screening.

EXAM:
DIGITAL SCREENING UNILATERAL LEFT MAMMOGRAM WITH CAD

[L MLO]
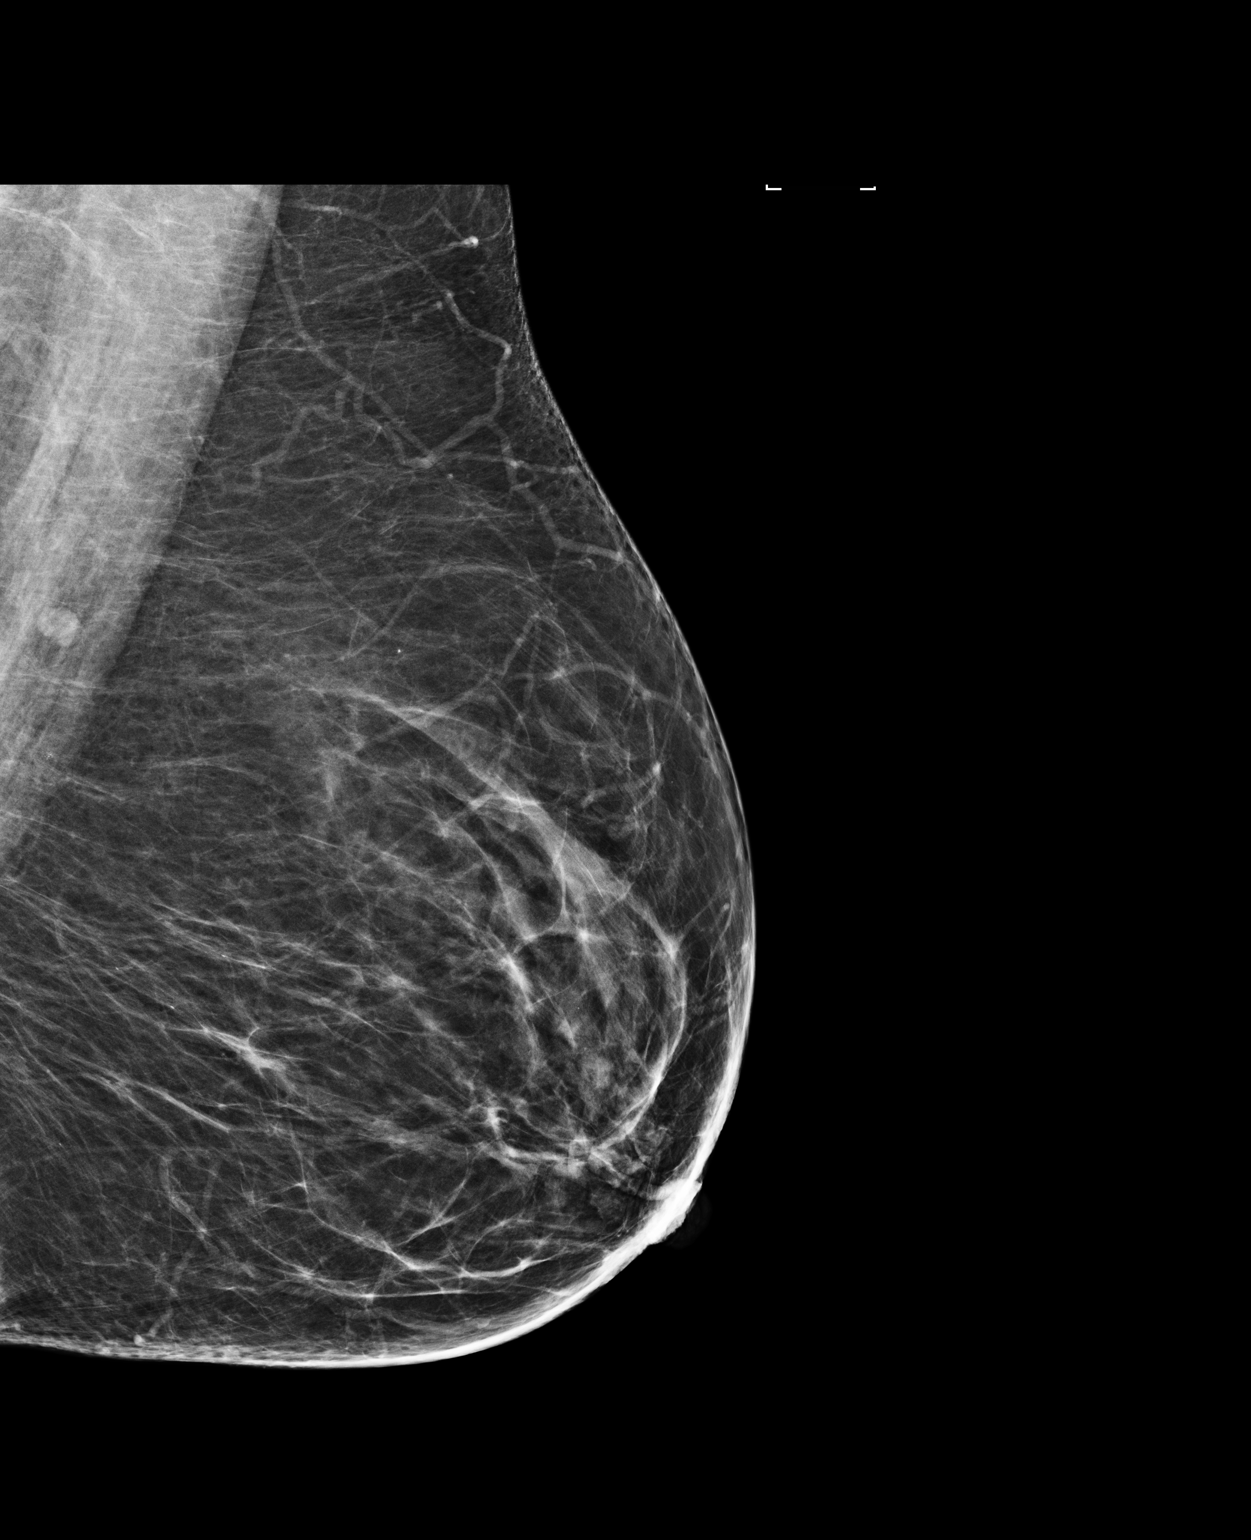

[L CC]
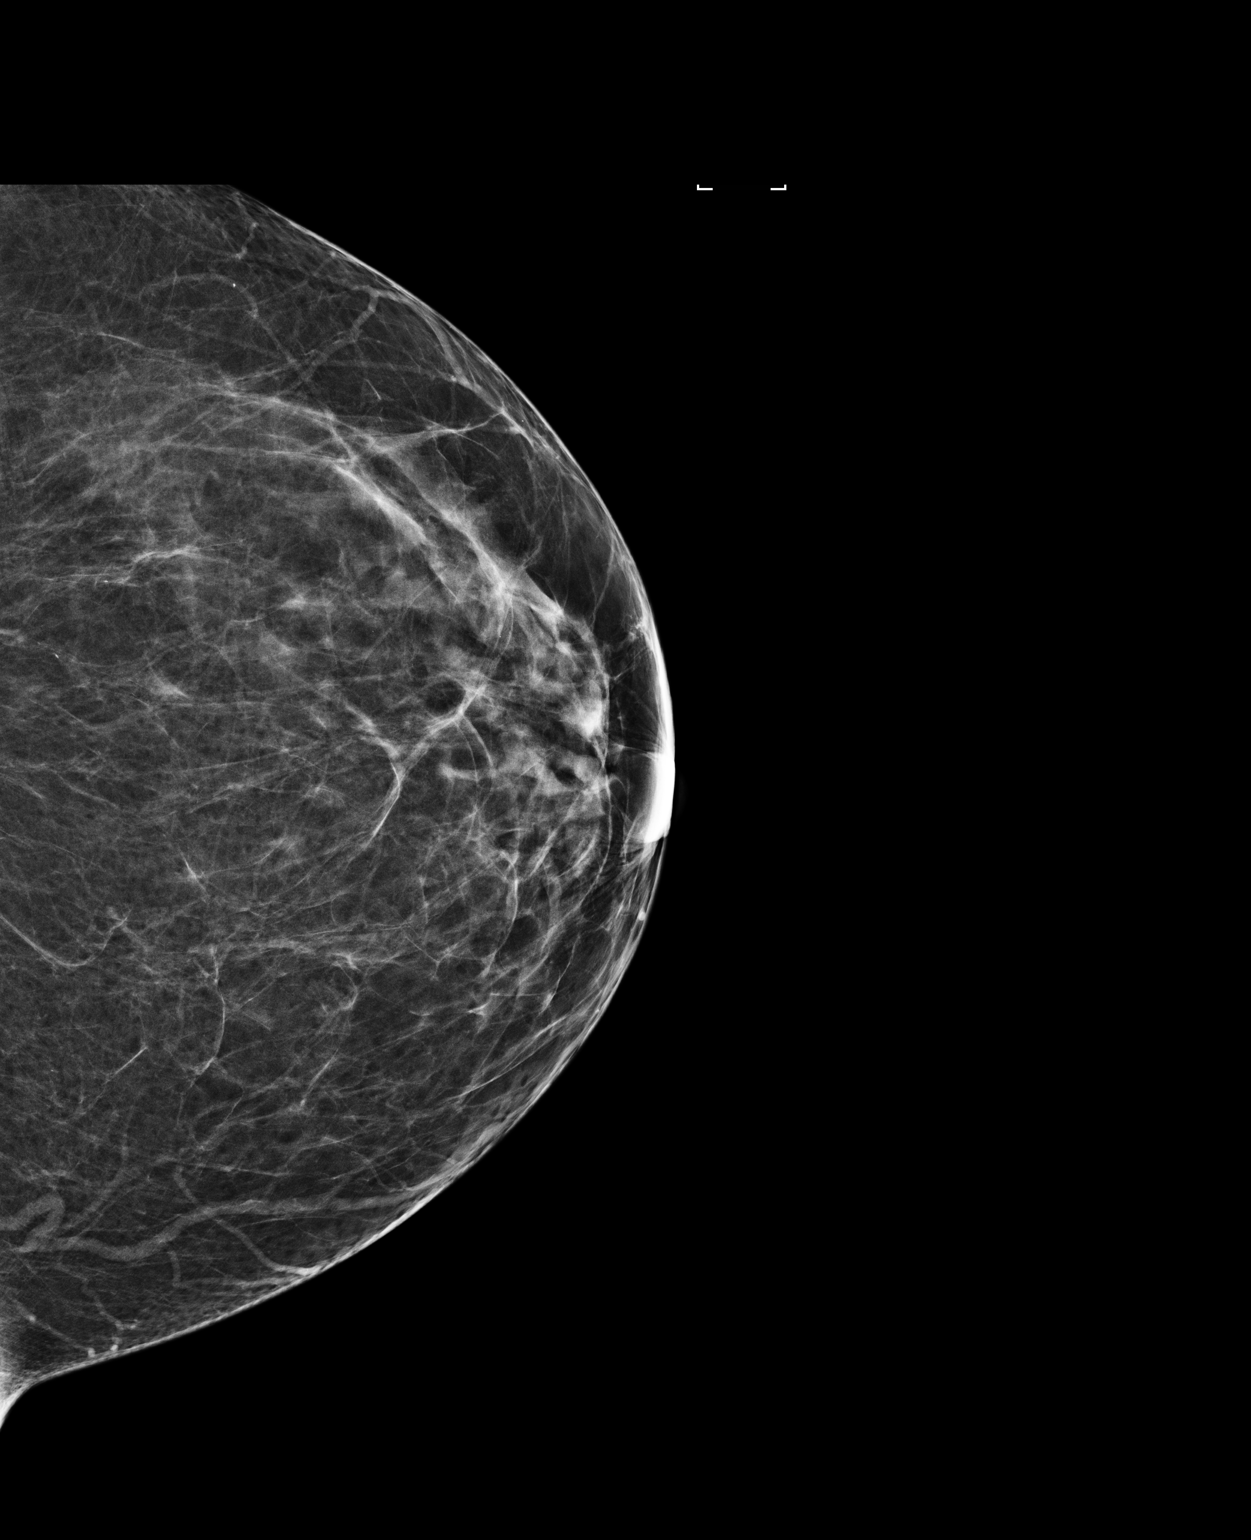

[2 of 2 positions shown; findings below may reference images not displayed]

ACR Breast Density Category b: There are scattered areas of
fibroglandular density.
FINDINGS: There are no findings suspicious for malignancy. Images were
processed with CAD.
IMPRESSION: No mammographic evidence of malignancy. A result letter of this
screening mammogram will be mailed directly to the patient.

RECOMMENDATION:
Screening mammogram in one year. (Code:N7-4-XCT)

BI-RADS CATEGORY  1: Negative.

## 2018-09-09 ENCOUNTER — Ambulatory Visit: Payer: BLUE CROSS/BLUE SHIELD | Admitting: Podiatry

## 2018-09-22 DIAGNOSIS — Z6837 Body mass index (BMI) 37.0-37.9, adult: Secondary | ICD-10-CM | POA: Diagnosis not present

## 2018-09-22 DIAGNOSIS — Z01419 Encounter for gynecological examination (general) (routine) without abnormal findings: Secondary | ICD-10-CM | POA: Diagnosis not present

## 2018-09-22 DIAGNOSIS — N762 Acute vulvitis: Secondary | ICD-10-CM | POA: Diagnosis not present

## 2018-09-22 DIAGNOSIS — Z1231 Encounter for screening mammogram for malignant neoplasm of breast: Secondary | ICD-10-CM | POA: Diagnosis not present

## 2018-12-29 ENCOUNTER — Ambulatory Visit: Payer: BLUE CROSS/BLUE SHIELD | Admitting: Podiatry

## 2019-01-27 ENCOUNTER — Other Ambulatory Visit: Payer: Self-pay

## 2019-01-27 ENCOUNTER — Ambulatory Visit: Payer: BC Managed Care – PPO | Admitting: Podiatry

## 2019-01-27 ENCOUNTER — Encounter: Payer: Self-pay | Admitting: Podiatry

## 2019-01-27 DIAGNOSIS — G620 Drug-induced polyneuropathy: Secondary | ICD-10-CM

## 2019-01-27 DIAGNOSIS — B351 Tinea unguium: Secondary | ICD-10-CM | POA: Diagnosis not present

## 2019-01-27 DIAGNOSIS — M79675 Pain in left toe(s): Secondary | ICD-10-CM | POA: Diagnosis not present

## 2019-01-27 DIAGNOSIS — M79674 Pain in right toe(s): Secondary | ICD-10-CM

## 2019-01-27 DIAGNOSIS — T451X5A Adverse effect of antineoplastic and immunosuppressive drugs, initial encounter: Secondary | ICD-10-CM

## 2019-01-27 NOTE — Patient Instructions (Signed)

## 2019-02-01 NOTE — Progress Notes (Signed)
Subjective:  Barbara Rhodes presents to clinic today with h/o chemotherapy induced neuropathy, with cc of  painful, thick, discolored, elongated toenails  of both feet that become tender and patient cannot cut because of thickness. Pain is aggravated when wearing enclosed shoe gear and relieved with periodic professional debridement.  Patient voices no new pedal concerns on today's visit.  Medications reviewed in chart.  Allergies  Allergen Reactions  . Penicillins Anaphylaxis    DID THE REACTION INVOLVE: Swelling of the face/tongue/throat, SOB, or low BP? Yes Sudden or severe rash/hives, skin peeling, or the inside of the mouth or nose? No Did it require medical treatment? Yes When did it last happen?63 years ago If all above answers are "NO", may proceed with cephalosporin use.     Objective:  Physical Examination:  Vascular Examination: Capillary refill time immediate b/l.  Palpable DP/PT pulses b/l.  Digital hair present b/l.  No edema noted b/l.  Skin temperature gradient WNL b/l.  Dermatological Examination: Skin with normal turgor, texture and tone b/l.  No open wounds b/l.  No interdigital macerations noted b/l.  Elongated, thick, discolored brittle toenails with subungual debris and pain on dorsal palpation of nailbeds 1-5 b/l.  Musculoskeletal Examination: Muscle strength 5/5 to all muscle groups b/l.  Hallux limitus 1st MPJ b/l.   No pain, crepitus or joint discomfort with active/passive ROM.  Neurological Examination: Sensation absent b/l with 10 gram monofilament.  Vibratory sensation absent b/l.   Assessment: Mycotic nail infection with pain 1-5 b/l  Plan: 1. Toenails 1-5 b/l were debrided in length and girth without iatrogenic laceration. 2.  Continue soft, supportive shoe gear daily. 3.  Report any pedal injuries to medical professional. 4.  Follow up 4 months. 5.  Patient/POA to call should there be a question/concern in there  interim.

## 2019-02-24 ENCOUNTER — Other Ambulatory Visit: Payer: Self-pay

## 2019-02-24 ENCOUNTER — Ambulatory Visit (INDEPENDENT_AMBULATORY_CARE_PROVIDER_SITE_OTHER): Payer: BC Managed Care – PPO

## 2019-02-24 ENCOUNTER — Ambulatory Visit: Payer: BC Managed Care – PPO | Admitting: Podiatry

## 2019-02-24 DIAGNOSIS — M722 Plantar fascial fibromatosis: Secondary | ICD-10-CM | POA: Diagnosis not present

## 2019-02-24 NOTE — Progress Notes (Signed)
Subjective:  Patient ID: Barbara Rhodes, female    DOB: Jun 12, 1955,  MRN: [id number] HPI Chief Complaint  Patient presents with  . Foot Pain    Pt states left posterior and plantar heel pain 1 week duration "possibly due to my exercises"    64 y.o. female presents with the above complaint.   ROS: Denies fever chills nausea vomiting muscle aches pains calf pain back pain chest pain shortness of breath.  Past Medical History:  Diagnosis Date  . Arthritis   . Breast cancer ([place]) 2000   Stage 2B, ER+/PR-/Her2-  . Colon polyps   . Family history of breast cancer   . Family history of prostate cancer   . Hypertension    Past Surgical History:  Procedure Laterality Date  . ABDOMINAL HYSTERECTOMY    . COLONOSCOPY WITH PROPOFOL     pt said propofol burned her throat when pushed  . MASTECTOMY Right 2000  . POSTERIOR CERVICAL LAMINECTOMY Left 01/23/2018   Procedure: LEFT CERVICAL SEVEN- THORACIC ONE LAMINOTOMY,FORAMINOTOMY, MICRODISCECTOMY;  Surgeon: Jovita Gamma, MD;  Location: [facility];  Service: Neurosurgery;  Laterality: Left;  . ROTATOR CUFF REPAIR    . TONSILLECTOMY    . TUBAL LIGATION    . WISDOM TOOTH EXTRACTION      Current Outpatient Medications:  .  celecoxib (CELEBREX) 200 MG capsule, Take 200 mg by mouth daily. , Disp: , Rfl:  .  Cholecalciferol (VITAMIN D3) 2000 units capsule, Take 2,000 Units by mouth daily. , Disp: , Rfl:  .  ELDERBERRY PO, Take 50 mg by mouth daily. 25 mg per capsule, Disp: , Rfl:  .  fluorouracil (EFUDEX) 5 % cream, fluorouracil 5 % topical cream  APPLY ON THE SKIN TWICE A DAY FOR 2 WEEKS, Disp: , Rfl:  .  HYDROcodone-acetaminophen (NORCO/VICODIN) 5-325 MG tablet, Take 1-2 tablets by mouth every 4 (four) hours as needed (pain)., Disp: 30 tablet, Rfl: 0 .  Lutein 20 MG TABS, Take 20 mg by mouth daily. , Disp: , Rfl:  .  Multiple Vitamins-Minerals (MULTIVITAMIN WITH MINERALS) tablet, Take 1 tablet by mouth daily. , Disp: , Rfl:  .   nystatin-triamcinolone ointment (MYCOLOG), nystatin-triamcinolone 100,000 unit/gram-0.1 % topical ointment  APPLY TO AFFECTED AREA TWICE A DAY, Disp: , Rfl:  .  Omega-3 1000 MG CAPS, Take 1,000 mg by mouth daily. , Disp: , Rfl:  .  Potassium 99 MG TABS, Take 99 mg by mouth daily., Disp: , Rfl:  .  predniSONE (DELTASONE) 10 MG tablet, prednisone 10 mg tablet  TAKE 4 TABLETS BY MOUTH EVERY DAY, Disp: , Rfl:  .  triamterene-hydrochlorothiazide (MAXZIDE-25) 37.5-25 MG tablet, Take 1 tablet by mouth daily. , Disp: , Rfl:   Allergies  Allergen Reactions  . Penicillins Anaphylaxis    DID THE REACTION INVOLVE: Swelling of the face/tongue/throat, SOB, or low BP? Yes Sudden or severe rash/hives, skin peeling, or the inside of the mouth or nose? No Did it require medical treatment? Yes When did it last happen?63 years ago If all above answers are "NO", may proceed with cephalosporin use.    Review of Systems Objective:  There were no vitals filed for this visit.  General: Well developed, nourished, in no acute distress, alert and oriented x3   Dermatological: Skin is warm, dry and supple bilateral. Nails x 10 are well maintained; remaining integument appears unremarkable at this time. There are no open sores, no preulcerative lesions, no rash or signs of infection present.  Vascular: Dorsalis Pedis artery  and Posterior Tibial artery pedal pulses are 2/4 bilateral with immedate capillary fill time. Pedal hair growth present. No varicosities and no lower extremity edema present bilateral.   Neruologic: Grossly intact via light touch bilateral. Vibratory intact via tuning fork bilateral. Protective threshold with Semmes Wienstein monofilament intact to all pedal sites bilateral. Patellar and Achilles deep tendon reflexes 2+ bilateral. No Babinski or clonus noted bilateral.   Musculoskeletal: No gross boney pedal deformities bilateral. No pain, crepitus, or limitation noted with foot and ankle  range of motion bilateral. Muscular strength 5/5 in all groups tested bilateral.  Pain on palpation medial calcaneal tubercle left heel.  Gait: Unassisted, Nonantalgic.    Radiographs:  Soft tissue increase in density at the plantar fascial kidney insertion site consistent with plantar fasciitis she also has a significant finding of severe osteoarthropathy of the first metatarsophalangeal joint and of the second TMT J.  No other significant findings are noted.  Assessment & Plan:   Assessment: Plantar fasciitis left foot.  Calcaneal bursitis left.  Plan: We discussed etiology pathology conservative surgical therapies at this point injected the left heel with 20 mg Kenalog 5 mg Marcaine point maximal tenderness left foot.  Placed her in a plantar fascial brace. She will continue to take her Celebrex on a regular basis we discussed appropriate shoe gear stretching exercises ice therapy and shoe gear modifications.    Barbara Rhodes, [place]

## 2019-04-07 ENCOUNTER — Other Ambulatory Visit: Payer: Self-pay

## 2019-04-07 ENCOUNTER — Ambulatory Visit: Payer: BC Managed Care – PPO | Admitting: Podiatry

## 2019-04-07 ENCOUNTER — Encounter: Payer: Self-pay | Admitting: Podiatry

## 2019-04-07 DIAGNOSIS — M722 Plantar fascial fibromatosis: Secondary | ICD-10-CM

## 2019-04-07 NOTE — Progress Notes (Signed)
She states today that her plantar fasciitis is doing better she refers to her left heel.  States that she is been unable to wear the brace due to her rotator cuff surgery left by Dr. Theda Sers.  She states that her plantar fasciitis is approximately 90% improved.  Objective: Vital signs are stable alert and oriented x3.  Pulses are palpable.  There is no erythema edema cellulitis drainage or odor she has pain palpation medial continue tubercle of the left heel.  Assessment: Residual plantar fasciitis 90% improved.  Plan: She will continue to take her Celebrex continue to wear plantar fascial brace night splint all other conservative therapies and I will reinject today with 20 mg of Kenalog 5 mg Marcaine point of maximal tenderness left heel follow-up with her in 1 month if necessary.  Discussed appropriate shoe gear and sandals today.

## 2019-05-19 ENCOUNTER — Ambulatory Visit: Payer: BC Managed Care – PPO | Admitting: Podiatry

## 2019-05-25 ENCOUNTER — Ambulatory Visit: Payer: BC Managed Care – PPO | Admitting: Podiatry

## 2019-07-17 DIAGNOSIS — M545 Low back pain, unspecified: Secondary | ICD-10-CM | POA: Insufficient documentation

## 2019-07-31 DIAGNOSIS — M5416 Radiculopathy, lumbar region: Secondary | ICD-10-CM | POA: Insufficient documentation

## 2019-08-07 ENCOUNTER — Other Ambulatory Visit: Payer: Self-pay | Admitting: Neurosurgery

## 2019-09-14 ENCOUNTER — Other Ambulatory Visit: Payer: Self-pay | Admitting: Neurosurgery

## 2019-10-08 ENCOUNTER — Inpatient Hospital Stay: Admit: 2019-10-08 | Payer: BLUE CROSS/BLUE SHIELD | Admitting: Neurosurgery

## 2019-10-08 SURGERY — TRANSFORAMINAL LUMBAR INTERBODY FUSION (TLIF) WITH PEDICLE SCREW FIXATION 2 LEVEL
Anesthesia: General | Site: Back | Laterality: Right

## 2019-10-26 NOTE — Progress Notes (Signed)
Your procedure is scheduled on Friday, October 30, 2019.  Report to [facility] Main Entrance "A" at 5:30 A.M., and check in at the Admitting office.  Call this number if you have problems the morning of surgery:  [phone]  Call [phone] if you have any questions prior to your surgery date Monday-Friday 8am-4pm    Remember:  Do not eat or drink after midnight the night before your surgery     Take these medicines the morning of surgery with A SIP OF WATER:  celecoxib (CELEBREX) acetaminophen (TYLENOL) - if needed naphazoline-pheniramine (ALLERGY EYE) 0.025-0.3 % ophthalmic solution - if needed  As of today, STOP taking any Aspirin (unless otherwise instructed by your surgeon) Aleve, Naproxen, Ibuprofen, Motrin, Advil, Goody's, BC's, all herbal medications, fish oil, and all vitamins.                      Do not wear jewelry, make up, or nail polish            Do not wear lotions, powders, perfumes, or deodorant.            Do not shave 48 hours prior to surgery.            Do not bring valuables to the hospital.            [facility] is not responsible for any belongings or valuables.  Do NOT Smoke (Tobacco/Vaping) or drink Alcohol 24 hours prior to your procedure If you use a CPAP at night, you may bring all equipment for your overnight stay.   Contacts, glasses, dentures or bridgework may not be worn into surgery.      For patients admitted to the hospital, discharge time will be determined by your treatment team.   Patients discharged the day of surgery will not be allowed to drive home, and someone needs to stay with them for 24 hours.    Special instructions:   [facility]- Preparing For Surgery  Before surgery, you can play an important role. Because skin is not sterile, your skin needs to be as free of germs as possible. You can reduce the number of germs on your skin by washing with CHG (chlorahexidine gluconate) Soap before surgery.  CHG is an antiseptic  cleaner which kills germs and bonds with the skin to continue killing germs even after washing.    Oral Hygiene is also important to reduce your risk of infection.  Remember - BRUSH YOUR TEETH THE MORNING OF SURGERY WITH YOUR REGULAR TOOTHPASTE  Please do not use if you have an allergy to CHG or antibacterial soaps. If your skin becomes reddened/irritated stop using the CHG.  Do not shave (including legs and underarms) for at least 48 hours prior to first CHG shower. It is OK to shave your face.  Please follow these instructions carefully.   1. Shower the NIGHT BEFORE SURGERY and the MORNING OF SURGERY with CHG Soap.   2. If you chose to wash your hair, wash your hair first as usual with your normal shampoo.  3. After you shampoo, rinse your hair and body thoroughly to remove the shampoo.  4. Use CHG as you would any other liquid soap. You can apply CHG directly to the skin and wash gently with a scrungie or a clean washcloth.   5. Apply the CHG Soap to your body ONLY FROM THE NECK DOWN.  Do not use on open wounds or open sores. Avoid contact with  your eyes, ears, mouth and genitals (private parts). Wash Face and genitals (private parts)  with your normal soap.   6. Wash thoroughly, paying special attention to the area where your surgery will be performed.  7. Thoroughly rinse your body with warm water from the neck down.  8. DO NOT shower/wash with your normal soap after using and rinsing off the CHG Soap.  9. Pat yourself dry with a CLEAN TOWEL.  10. Wear CLEAN PAJAMAS to bed the night before surgery  11. Place CLEAN SHEETS on your bed the night of your first shower and DO NOT SLEEP WITH PETS.   Day of Surgery: Wear Clean/Comfortable clothing the morning of surgery Do not apply any deodorants/lotions.   Remember to brush your teeth WITH YOUR REGULAR TOOTHPASTE.   Please read over the following fact sheets that you were given.

## 2019-10-27 ENCOUNTER — Other Ambulatory Visit: Payer: Self-pay

## 2019-10-27 ENCOUNTER — Encounter (HOSPITAL_COMMUNITY): Payer: Self-pay

## 2019-10-27 ENCOUNTER — Other Ambulatory Visit (HOSPITAL_COMMUNITY)
Admission: RE | Admit: 2019-10-27 | Discharge: 2019-10-27 | Disposition: A | Discharge status: Home / Self Care | Payer: BC Managed Care – PPO | Source: Ambulatory Visit | Attending: Neurosurgery | Admitting: Neurosurgery

## 2019-10-27 ENCOUNTER — Encounter (HOSPITAL_COMMUNITY)
Admission: RE | Admit: 2019-10-27 | Discharge: 2019-10-27 | Disposition: A | Discharge status: Home / Self Care | Payer: BC Managed Care – PPO | Source: Ambulatory Visit | Attending: Neurosurgery | Admitting: Neurosurgery

## 2019-10-27 DIAGNOSIS — Z20822 Contact with and (suspected) exposure to covid-19: Secondary | ICD-10-CM | POA: Insufficient documentation

## 2019-10-27 DIAGNOSIS — Z01818 Encounter for other preprocedural examination: Secondary | ICD-10-CM | POA: Insufficient documentation

## 2019-10-27 DIAGNOSIS — Z01812 Encounter for preprocedural laboratory examination: Secondary | ICD-10-CM | POA: Insufficient documentation

## 2019-10-27 LAB — CBC
HCT: 42.9 % (ref 36.0–46.0)
Hemoglobin: 13.9 g/dL (ref 12.0–15.0)
MCH: 30.2 pg (ref 26.0–34.0)
MCHC: 32.4 g/dL (ref 30.0–36.0)
MCV: 93.3 fL (ref 80.0–100.0)
Platelets: 303 10*3/uL (ref 150–400)
RBC: 4.6 MIL/uL (ref 3.87–5.11)
RDW: 12.8 % (ref 11.5–15.5)
WBC: 6.3 10*3/uL (ref 4.0–10.5)
nRBC: 0 % (ref 0.0–0.2)

## 2019-10-27 LAB — BASIC METABOLIC PANEL
Anion gap: 11 (ref 5–15)
BUN: 12 mg/dL (ref 8–23)
CO2: 26 mmol/L (ref 22–32)
Calcium: 9.3 mg/dL (ref 8.9–10.3)
Chloride: 102 mmol/L (ref 98–111)
Creatinine, Ser: 0.86 mg/dL (ref 0.44–1.00)
GFR calc non Af Amer: >60 mL/min (ref >60)
Glucose, Bld: 130 mg/dL — ABNORMAL HIGH (ref 70–99)
Potassium: 3.4 mmol/L — ABNORMAL LOW (ref 3.5–5.1)
Sodium: 139 mmol/L (ref 135–145)

## 2019-10-27 LAB — SURGICAL PCR SCREEN
MRSA, PCR: NEGATIVE
Staphylococcus aureus: NEGATIVE

## 2019-10-27 LAB — SARS CORONAVIRUS 2 (TAT 6-24 HRS): SARS Coronavirus 2: NEGATIVE

## 2019-10-27 LAB — TYPE AND SCREEN
ABO/RH(D): O NEG
Antibody Screen: NEGATIVE

## 2019-10-27 NOTE — Progress Notes (Signed)
PCP - Dr. Clemmie Krill Cardiologist - Denies Podiatrist: Dr Acquanetta Sit  PPM/ICD - Denies  Chest x-ray - N/A EKG - 10/27/19 Stress Test - Denies ECHO - Denies Cardiac Cath - Denies  Sleep Study - Denies  Patient denies being diabetic.  Blood Thinner Instructions: N/A Aspirin Instructions: N/A  ERAS Protcol - No  COVID TEST- 10/27/19   Anesthesia review: Yes, abnormal EKG  Patient denies shortness of breath, fever, cough and chest pain at PAT appointment   All instructions explained to the patient, with a verbal understanding of the material. Patient agrees to go over the instructions while at home for a better understanding. Patient also instructed to self quarantine after being tested for COVID-19. The opportunity to ask questions was provided.

## 2019-10-27 NOTE — H&P (Signed)
Patient ID:   [id number] Patient: Barbara Rhodes  Date of Birth: 1955/05/18 Visit Type: Office Visit   Date: 10/14/2019 09:00 AM Provider: Marchia Meiers. Vertell Limber MD   This 64 year old female presents for Pre-op.  HISTORY OF PRESENT ILLNESS: 1.  Pre-op  Patient returns to discuss surgery.  She reports no change since her visit in August, reporting no leg pain, right foot drop unchanged.  She has decided to proceed with surgery given her right buttock and right thigh weakness and numbness.  She continues to swim 3 times weekly and perform home exercises.  She did not pursue another injection as she has experienced no pain.  AFO used as needed.  I had a lengthy discussion with the patient about her situation.  She does not have a footdrop on the right but she does have persistent significant weakness in her right dorsiflexors and more prominent right hip abductor weakness which is interfering with her ability to walk.  Despite physical therapy and working hard at regaining her strength she does not see significant improvement at this point.  Because of her lack of progress and the significant weakness that she demonstrates on her examination she wishes to proceed with surgery.  This has been scheduled for October 8th.  We had a lengthy discussion about treatment options and alternatives and she is comfortable with proceeding with surgical intervention      Medical/Surgical/Interim History Reviewed, no change.  Last detailed document date:12/31/2017.     PAST MEDICAL HISTORY, SURGICAL HISTORY, FAMILY HISTORY, SOCIAL HISTORY AND REVIEW OF SYSTEMS I have reviewed the patient's past medical, surgical, family and social history as well as the comprehensive review of systems as included on the [facility] history form dated 10/14/2019, which I have signed.  Family History: Reviewed, no changes.  Last detailed document date:12/31/2017.   Social History: Reviewed,  no changes. Last detailed document date: 12/31/2017.    MEDICATIONS: (added, continued or stopped this visit) Started Medication Directions Instruction Stopped  Calcium 500  500 mg calcium (1,250 mg) tablet     Celebrex 200 mg capsule take 1 capsule by oral route  every day    elderberry  ORAL take 1 by oral route  every day    fish oil take 1 by oral route  every day    lutein take 1 by oral route  every day    maxide take 1 tablet by mouth daily    Multiple Vitamins tablet take 1 by oral route  every day    Vitamin D3  ORAL       ALLERGIES: Ingredient Reaction Medication Name Comment PENICILLINS Anaphylaxis    Reviewed, no changes.    PHYSICAL EXAM:  Vitals Date Temp F BP Pulse Ht In Wt Lb BMI BSA Pain Score 10/14/2019  138/82 80 64 213.6 36.66  0/10     IMPRESSION:  Persistent significant right leg weakness.  This despite extended conservative management efforts to improve the situation including extensive physical therapy  PLAN: Proceed with previously planned surgery on 10/30/2019.  This will consist of right TLIF L3-4, L4-5 levels with pedicle screw fixation.  Orders: Diagnostic Procedures: Assessment Procedure M54.16 Lumbar Spine- AP/Lat Instruction(s)/Education: Assessment Instruction R03.0 Lifestyle education Z68.36 Dietary management education, guidance, and counseling Miscellaneous: Assessment  M48.061 LSO Brace  Completed Orders (this encounter) Order Details Reason Side Interpretation Result Initial Treatment Date Region Lifestyle education Patient will monitor and contact primary care physician if needed.  Dietary management education, guidance, and counseling Encouraged patient to eat well balanced diet.        Assessment/Plan  # Detail Type Description  1. Assessment Right foot drop (M21.371).     2. Assessment Spondylolisthesis, lumbar region  (M43.16).     3. Assessment Degenerative lumbar spinal stenosis (M48.061).  Plan Orders LSO Brace.     4. Assessment Lumbar radiculopathy (M54.16).     5. Assessment Elevated blood-pressure reading, w/o diagnosis of htn (R03.0).     6. Assessment Body mass index (BMI) 36.0-36.9, adult (Z68.36).  Plan Orders Today's instructions / counseling include(s) Dietary management education, guidance, and counseling. Clinical information/comments: Encouraged patient to eat well balanced diet.       Pain Management Plan Pain Scale: 0/10. Method: Numeric Pain Intensity Scale.              Provider:  Marchia Meiers. Vertell Limber MD  10/17/2019 02:45 PM    Dictation edited by: Marchia Meiers. [facility]    CC Providers: Clemmie Krill [address] [place],  [place]  [id number]   Florene Route  [address] [place] [address]               Electronically signed by Marchia Meiers. Vertell Limber MD on 10/17/2019 02:45 PM

## 2019-10-29 NOTE — Anesthesia Preprocedure Evaluation (Addendum)
Anesthesia Evaluation  Patient identified by MRN, date of birth, ID band Patient awake  General Assessment Comment:Family history of adverse reaction to anesthesia 1959 Brother passed away at 66 years old from malignant hyperthermia.   Reviewed: Allergy & Precautions, NPO status , Patient's Chart, lab work & pertinent test results  History of Anesthesia Complications (+) Family history of anesthesia reaction  Airway Mallampati: II  TM Distance: >3 FB Neck ROM: Limited    Dental  (+) Teeth Intact, Dental Advisory Given   Pulmonary neg pulmonary ROS,    Pulmonary exam normal        Cardiovascular hypertension, Pt. on medications Normal cardiovascular exam     Neuro/Psych negative neurological ROS     GI/Hepatic negative GI ROS, Neg liver ROS,   Endo/Other  Morbid obesity  Renal/GU negative Renal ROS     Musculoskeletal  (+) Arthritis ,   Abdominal   Peds  Hematology negative hematology ROS (+)   Anesthesia Other Findings   Reproductive/Obstetrics                            Lab Results  Component Value Date   WBC 6.3 10/27/2019   HGB 13.9 10/27/2019   HCT 42.9 10/27/2019   MCV 93.3 10/27/2019   PLT 303 10/27/2019   Lab Results  Component Value Date   CREATININE 0.86 10/27/2019   BUN 12 10/27/2019   NA 139 10/27/2019   K 3.4 (L) 10/27/2019   CL 102 10/27/2019   CO2 26 10/27/2019    Anesthesia Physical  Anesthesia Plan  ASA: II  Anesthesia Plan: General   Post-op Pain Management:    Induction: Intravenous  PONV Risk Score and Plan: 3 and Dexamethasone, Ondansetron, Treatment may vary due to age or medical condition and Midazolam  Airway Management Planned: Oral ETT and Video Laryngoscope Planned  Additional Equipment: None  Intra-op Plan:   Post-operative Plan: Extubation in OR  Informed Consent: I have reviewed the patients History and Physical, chart, labs and  discussed the procedure including the risks, benefits and alternatives for the proposed anesthesia with the patient or authorized representative who has indicated his/her understanding and acceptance.     Dental advisory given  Plan Discussed with: CRNA and Anesthesiologist  Anesthesia Plan Comments:       Anesthesia Quick Evaluation

## 2019-10-30 ENCOUNTER — Inpatient Hospital Stay (HOSPITAL_COMMUNITY): Payer: BC Managed Care – PPO | Admitting: Anesthesiology

## 2019-10-30 ENCOUNTER — Inpatient Hospital Stay (HOSPITAL_COMMUNITY): Payer: BC Managed Care – PPO | Admitting: Physician Assistant

## 2019-10-30 ENCOUNTER — Inpatient Hospital Stay (HOSPITAL_COMMUNITY): Payer: BC Managed Care – PPO

## 2019-10-30 ENCOUNTER — Encounter (HOSPITAL_COMMUNITY)
Admission: RE | Disposition: A | Discharge status: Home / Self Care | Payer: Self-pay | Source: Home / Self Care | Attending: Neurosurgery

## 2019-10-30 ENCOUNTER — Other Ambulatory Visit: Payer: Self-pay

## 2019-10-30 ENCOUNTER — Inpatient Hospital Stay (HOSPITAL_COMMUNITY)
Admission: RE | Admit: 2019-10-30 | Discharge: 2019-10-31 | DRG: 455 | Disposition: A | Discharge status: Home / Self Care | Payer: BC Managed Care – PPO | Attending: Neurosurgery | Admitting: Neurosurgery

## 2019-10-30 ENCOUNTER — Encounter (HOSPITAL_COMMUNITY): Payer: Self-pay | Admitting: Neurosurgery

## 2019-10-30 DIAGNOSIS — Z20822 Contact with and (suspected) exposure to covid-19: Secondary | ICD-10-CM | POA: Diagnosis present

## 2019-10-30 DIAGNOSIS — M4310 Spondylolisthesis, site unspecified: Secondary | ICD-10-CM

## 2019-10-30 DIAGNOSIS — Z880 Allergy status to penicillin: Secondary | ICD-10-CM

## 2019-10-30 DIAGNOSIS — M48061 Spinal stenosis, lumbar region without neurogenic claudication: Secondary | ICD-10-CM | POA: Diagnosis present

## 2019-10-30 DIAGNOSIS — Z6836 Body mass index (BMI) 36.0-36.9, adult: Secondary | ICD-10-CM | POA: Diagnosis not present

## 2019-10-30 DIAGNOSIS — M21371 Foot drop, right foot: Secondary | ICD-10-CM | POA: Diagnosis present

## 2019-10-30 DIAGNOSIS — M5116 Intervertebral disc disorders with radiculopathy, lumbar region: Secondary | ICD-10-CM | POA: Diagnosis present

## 2019-10-30 DIAGNOSIS — I10 Essential (primary) hypertension: Secondary | ICD-10-CM | POA: Diagnosis present

## 2019-10-30 DIAGNOSIS — M4316 Spondylolisthesis, lumbar region: Secondary | ICD-10-CM | POA: Diagnosis present

## 2019-10-30 DIAGNOSIS — Z01812 Encounter for preprocedural laboratory examination: Secondary | ICD-10-CM

## 2019-10-30 HISTORY — PX: TRANSFORAMINAL LUMBAR INTERBODY FUSION (TLIF) WITH PEDICLE SCREW FIXATION 2 LEVEL: SHX6142

## 2019-10-30 LAB — ABO/RH: ABO/RH(D): O NEG

## 2019-10-30 SURGERY — TRANSFORAMINAL LUMBAR INTERBODY FUSION (TLIF) WITH PEDICLE SCREW FIXATION 2 LEVEL
Anesthesia: General | Site: Back | Laterality: Right

## 2019-10-30 MED ORDER — PANTOPRAZOLE SODIUM 40 MG PO TBEC
40.0000 mg | DELAYED_RELEASE_TABLET | Freq: Every day | ORAL | Status: DC
  Administered 2019-10-30: 40 mg via ORAL
  Filled 2019-10-30: qty 1

## 2019-10-30 MED ORDER — METHOCARBAMOL 500 MG PO TABS
ORAL_TABLET | ORAL | Status: AC
  Filled 2019-10-30: qty 1

## 2019-10-30 MED ORDER — CALCIUM CARBONATE-VITAMIN D 500-200 MG-UNIT PO TABS: 1.0000 | ORAL_TABLET | Freq: Every day | ORAL | Status: DC

## 2019-10-30 MED ORDER — MENTHOL 3 MG MT LOZG: 1.0000 | LOZENGE | OROMUCOSAL | Status: DC | PRN

## 2019-10-30 MED ORDER — MIDAZOLAM HCL 5 MG/5ML IJ SOLN
INTRAMUSCULAR | Status: DC | PRN
  Administered 2019-10-30: 2 mg via INTRAVENOUS

## 2019-10-30 MED ORDER — LIDOCAINE-EPINEPHRINE 1 %-1:100000 IJ SOLN
INTRAMUSCULAR | Status: DC | PRN
  Administered 2019-10-30: 5 mL

## 2019-10-30 MED ORDER — TRIAMTERENE-HCTZ 37.5-25 MG PO TABS
1.0000 | ORAL_TABLET | Freq: Every day | ORAL | Status: DC
  Filled 2019-10-30 (×2): qty 1

## 2019-10-30 MED ORDER — PHENYLEPHRINE 40 MCG/ML (10ML) SYRINGE FOR IV PUSH (FOR BLOOD PRESSURE SUPPORT)
PREFILLED_SYRINGE | INTRAVENOUS | Status: DC | PRN
  Administered 2019-10-30 (×5): 80 ug via INTRAVENOUS

## 2019-10-30 MED ORDER — ROCURONIUM BROMIDE 10 MG/ML (PF) SYRINGE
PREFILLED_SYRINGE | INTRAVENOUS | Status: AC
  Filled 2019-10-30: qty 10

## 2019-10-30 MED ORDER — SODIUM CHLORIDE 0.9 % IV SOLN
250.0000 mL | INTRAVENOUS | Status: DC
  Administered 2019-10-30: 250 mL via INTRAVENOUS

## 2019-10-30 MED ORDER — PHENOL 1.4 % MT LIQD: 1.0000 | OROMUCOSAL | Status: DC | PRN

## 2019-10-30 MED ORDER — ONDANSETRON HCL 4 MG/2ML IJ SOLN
INTRAMUSCULAR | Status: DC | PRN
  Administered 2019-10-30: 4 mg via INTRAVENOUS

## 2019-10-30 MED ORDER — VANCOMYCIN HCL IN DEXTROSE 1-5 GM/200ML-% IV SOLN: 1000.0000 mg | INTRAVENOUS | Status: AC

## 2019-10-30 MED ORDER — ACETAMINOPHEN 500 MG PO TABS: 500.0000 mg | ORAL_TABLET | Freq: Four times a day (QID) | ORAL | Status: DC | PRN

## 2019-10-30 MED ORDER — METHOCARBAMOL 1000 MG/10ML IJ SOLN
500.0000 mg | Freq: Four times a day (QID) | INTRAVENOUS | Status: DC | PRN
  Filled 2019-10-30: qty 5

## 2019-10-30 MED ORDER — VANCOMYCIN HCL IN DEXTROSE 1-5 GM/200ML-% IV SOLN
INTRAVENOUS | Status: AC
  Administered 2019-10-30: 1000 mg via INTRAVENOUS
  Filled 2019-10-30: qty 200

## 2019-10-30 MED ORDER — MIDAZOLAM HCL 2 MG/2ML IJ SOLN
INTRAMUSCULAR | Status: AC
  Filled 2019-10-30: qty 2

## 2019-10-30 MED ORDER — POLYETHYLENE GLYCOL 3350 17 G PO PACK: 17.0000 g | PACK | Freq: Every day | ORAL | Status: DC | PRN

## 2019-10-30 MED ORDER — NAPHAZOLINE-PHENIRAMINE 0.025-0.3 % OP SOLN: 1.0000 [drp] | Freq: Four times a day (QID) | OPHTHALMIC | Status: DC | PRN

## 2019-10-30 MED ORDER — MAGNESIUM 200 MG PO TABS
250.0000 mg | ORAL_TABLET | ORAL | Status: DC
  Filled 2019-10-30: qty 2

## 2019-10-30 MED ORDER — MEPERIDINE HCL 25 MG/ML IJ SOLN: 6.2500 mg | INTRAMUSCULAR | Status: DC | PRN

## 2019-10-30 MED ORDER — BUPIVACAINE LIPOSOME 1.3 % IJ SUSP
20.0000 mL | Freq: Once | INTRAMUSCULAR | Status: AC
  Administered 2019-10-30: 20 mL
  Filled 2019-10-30: qty 20

## 2019-10-30 MED ORDER — ONDANSETRON HCL 4 MG/2ML IJ SOLN: 4.0000 mg | Freq: Once | INTRAMUSCULAR | Status: DC | PRN

## 2019-10-30 MED ORDER — FENTANYL CITRATE (PF) 100 MCG/2ML IJ SOLN
INTRAMUSCULAR | Status: DC | PRN
  Administered 2019-10-30: 50 ug via INTRAVENOUS
  Administered 2019-10-30: 100 ug via INTRAVENOUS
  Administered 2019-10-30 (×2): 50 ug via INTRAVENOUS

## 2019-10-30 MED ORDER — THROMBIN 5000 UNITS EX SOLR
CUTANEOUS | Status: AC
  Filled 2019-10-30: qty 5000

## 2019-10-30 MED ORDER — ACETAMINOPHEN 160 MG/5ML PO SOLN: 325.0000 mg | ORAL | Status: DC | PRN

## 2019-10-30 MED ORDER — RISAQUAD PO CAPS
1.0000 | ORAL_CAPSULE | Freq: Every day | ORAL | Status: DC
  Administered 2019-10-30: 1 via ORAL
  Filled 2019-10-30: qty 1

## 2019-10-30 MED ORDER — OXYCODONE HCL 5 MG PO TABS
ORAL_TABLET | ORAL | Status: AC
Start: 2019-10-30 — End: 2019-10-31
  Filled 2019-10-30: qty 1

## 2019-10-30 MED ORDER — CALCIUM CITRATE-VITAMIN D 200-250 MG-UNIT PO TABS: ORAL_TABLET | Freq: Every day | ORAL | Status: DC

## 2019-10-30 MED ORDER — EPHEDRINE 5 MG/ML INJ
INTRAVENOUS | Status: AC
  Filled 2019-10-30: qty 10

## 2019-10-30 MED ORDER — 0.9 % SODIUM CHLORIDE (POUR BTL) OPTIME
TOPICAL | Status: DC | PRN
  Administered 2019-10-30: 1000 mL

## 2019-10-30 MED ORDER — LACTATED RINGERS IV SOLN: INTRAVENOUS | Status: DC

## 2019-10-30 MED ORDER — HYDROCODONE-ACETAMINOPHEN 5-325 MG PO TABS: 1.0000 | ORAL_TABLET | ORAL | Status: DC | PRN

## 2019-10-30 MED ORDER — OXYCODONE HCL 5 MG PO TABS
5.0000 mg | ORAL_TABLET | ORAL | Status: DC | PRN
  Administered 2019-10-30: 5 mg via ORAL
  Filled 2019-10-30: qty 1

## 2019-10-30 MED ORDER — ROCURONIUM BROMIDE 10 MG/ML (PF) SYRINGE
PREFILLED_SYRINGE | INTRAVENOUS | Status: DC | PRN
  Administered 2019-10-30: 100 mg via INTRAVENOUS

## 2019-10-30 MED ORDER — DOCUSATE SODIUM 100 MG PO CAPS
100.0000 mg | ORAL_CAPSULE | Freq: Two times a day (BID) | ORAL | Status: DC
  Administered 2019-10-30: 100 mg via ORAL
  Filled 2019-10-30: qty 1

## 2019-10-30 MED ORDER — CHLORHEXIDINE GLUCONATE CLOTH 2 % EX PADS: 6.0000 | MEDICATED_PAD | Freq: Once | CUTANEOUS | Status: DC

## 2019-10-30 MED ORDER — EPHEDRINE SULFATE-NACL 50-0.9 MG/10ML-% IV SOSY
PREFILLED_SYRINGE | INTRAVENOUS | Status: DC | PRN
  Administered 2019-10-30: 10 mg via INTRAVENOUS

## 2019-10-30 MED ORDER — METHOCARBAMOL 500 MG PO TABS
500.0000 mg | ORAL_TABLET | Freq: Four times a day (QID) | ORAL | Status: DC | PRN
  Administered 2019-10-30 (×2): 500 mg via ORAL
  Filled 2019-10-30: qty 1

## 2019-10-30 MED ORDER — FENTANYL CITRATE (PF) 250 MCG/5ML IJ SOLN
INTRAMUSCULAR | Status: AC
  Filled 2019-10-30: qty 5

## 2019-10-30 MED ORDER — DEXAMETHASONE SODIUM PHOSPHATE 10 MG/ML IJ SOLN
INTRAMUSCULAR | Status: AC
  Filled 2019-10-30: qty 1

## 2019-10-30 MED ORDER — DEXAMETHASONE SODIUM PHOSPHATE 10 MG/ML IJ SOLN
INTRAMUSCULAR | Status: DC | PRN
  Administered 2019-10-30: 10 mg via INTRAVENOUS

## 2019-10-30 MED ORDER — ONDANSETRON HCL 4 MG/2ML IJ SOLN: 4.0000 mg | Freq: Four times a day (QID) | INTRAMUSCULAR | Status: DC | PRN

## 2019-10-30 MED ORDER — MAGNESIUM OXIDE 400 (241.3 MG) MG PO TABS: 200.0000 mg | ORAL_TABLET | ORAL | Status: DC

## 2019-10-30 MED ORDER — BUPIVACAINE HCL (PF) 0.5 % IJ SOLN
INTRAMUSCULAR | Status: DC | PRN
  Administered 2019-10-30: 5 mL

## 2019-10-30 MED ORDER — DAILY DIGESTIVE PROBIOTIC PO CAPS: 1.0000 | ORAL_CAPSULE | Freq: Every day | ORAL | Status: DC

## 2019-10-30 MED ORDER — ZOLPIDEM TARTRATE 5 MG PO TABS: 5.0000 mg | ORAL_TABLET | Freq: Every evening | ORAL | Status: DC | PRN

## 2019-10-30 MED ORDER — ACETAMINOPHEN 650 MG RE SUPP: 650.0000 mg | RECTAL | Status: DC | PRN

## 2019-10-30 MED ORDER — LIDOCAINE-EPINEPHRINE 1 %-1:100000 IJ SOLN
INTRAMUSCULAR | Status: AC
  Filled 2019-10-30: qty 1

## 2019-10-30 MED ORDER — LIDOCAINE 2% (20 MG/ML) 5 ML SYRINGE
INTRAMUSCULAR | Status: DC | PRN
  Administered 2019-10-30: 60 mg via INTRAVENOUS

## 2019-10-30 MED ORDER — ALUM & MAG HYDROXIDE-SIMETH 200-200-20 MG/5ML PO SUSP: 30.0000 mL | Freq: Four times a day (QID) | ORAL | Status: DC | PRN

## 2019-10-30 MED ORDER — CHLORHEXIDINE GLUCONATE 0.12 % MT SOLN
OROMUCOSAL | Status: AC
  Administered 2019-10-30: 15 mL via OROMUCOSAL
  Filled 2019-10-30: qty 15

## 2019-10-30 MED ORDER — ACETAMINOPHEN 10 MG/ML IV SOLN
INTRAVENOUS | Status: AC
  Filled 2019-10-30: qty 100

## 2019-10-30 MED ORDER — HYDROMORPHONE HCL 1 MG/ML IJ SOLN: 0.5000 mg | INTRAMUSCULAR | Status: DC | PRN

## 2019-10-30 MED ORDER — VITAMIN D 25 MCG (1000 UNIT) PO TABS: 5000.0000 [IU] | ORAL_TABLET | Freq: Every day | ORAL | Status: DC

## 2019-10-30 MED ORDER — POTASSIUM 99 MG PO TABS: 99.0000 mg | ORAL_TABLET | Freq: Every day | ORAL | Status: DC

## 2019-10-30 MED ORDER — THROMBIN 20000 UNITS EX SOLR
CUTANEOUS | Status: AC
  Filled 2019-10-30: qty 20000

## 2019-10-30 MED ORDER — BUPIVACAINE HCL (PF) 0.5 % IJ SOLN
INTRAMUSCULAR | Status: AC
  Filled 2019-10-30: qty 30

## 2019-10-30 MED ORDER — PROPOFOL 10 MG/ML IV BOLUS
INTRAVENOUS | Status: AC
  Filled 2019-10-30: qty 20

## 2019-10-30 MED ORDER — VANCOMYCIN HCL IN DEXTROSE 1-5 GM/200ML-% IV SOLN
1000.0000 mg | Freq: Once | INTRAVENOUS | Status: AC
  Administered 2019-10-30: 1000 mg via INTRAVENOUS
  Filled 2019-10-30: qty 200

## 2019-10-30 MED ORDER — THROMBIN 5000 UNITS EX SOLR
OROMUCOSAL | Status: DC | PRN
  Administered 2019-10-30: 5 mL via TOPICAL

## 2019-10-30 MED ORDER — ACETAMINOPHEN 10 MG/ML IV SOLN
INTRAVENOUS | Status: DC | PRN
  Administered 2019-10-30: 1000 mg via INTRAVENOUS

## 2019-10-30 MED ORDER — ONDANSETRON HCL 4 MG/2ML IJ SOLN
INTRAMUSCULAR | Status: AC
  Filled 2019-10-30: qty 2

## 2019-10-30 MED ORDER — OXYCODONE HCL 5 MG PO TABS
5.0000 mg | ORAL_TABLET | Freq: Once | ORAL | Status: AC | PRN
  Administered 2019-10-30: 5 mg via ORAL

## 2019-10-30 MED ORDER — FLEET ENEMA 7-19 GM/118ML RE ENEM: 1.0000 | ENEMA | Freq: Once | RECTAL | Status: DC | PRN

## 2019-10-30 MED ORDER — ACETAMINOPHEN 325 MG PO TABS
650.0000 mg | ORAL_TABLET | ORAL | Status: DC | PRN
  Administered 2019-10-30: 650 mg via ORAL
  Filled 2019-10-30: qty 2

## 2019-10-30 MED ORDER — LIDOCAINE 2% (20 MG/ML) 5 ML SYRINGE
INTRAMUSCULAR | Status: AC
  Filled 2019-10-30: qty 5

## 2019-10-30 MED ORDER — OXYCODONE HCL 5 MG/5ML PO SOLN: 5.0000 mg | Freq: Once | ORAL | Status: AC | PRN

## 2019-10-30 MED ORDER — BISACODYL 10 MG RE SUPP: 10.0000 mg | Freq: Every day | RECTAL | Status: DC | PRN

## 2019-10-30 MED ORDER — KCL IN DEXTROSE-NACL 20-5-0.45 MEQ/L-%-% IV SOLN: INTRAVENOUS | Status: DC

## 2019-10-30 MED ORDER — PANTOPRAZOLE SODIUM 40 MG IV SOLR: 40.0000 mg | Freq: Every day | INTRAVENOUS | Status: DC

## 2019-10-30 MED ORDER — ONDANSETRON HCL 4 MG PO TABS: 4.0000 mg | ORAL_TABLET | Freq: Four times a day (QID) | ORAL | Status: DC | PRN

## 2019-10-30 MED ORDER — SODIUM CHLORIDE 0.9% FLUSH: 3.0000 mL | Freq: Two times a day (BID) | INTRAVENOUS | Status: DC

## 2019-10-30 MED ORDER — THROMBIN 20000 UNITS EX SOLR
CUTANEOUS | Status: DC | PRN
  Administered 2019-10-30: 20 mL via TOPICAL

## 2019-10-30 MED ORDER — SODIUM CHLORIDE 0.9% FLUSH: 3.0000 mL | INTRAVENOUS | Status: DC | PRN

## 2019-10-30 MED ORDER — PROPOFOL 10 MG/ML IV BOLUS
INTRAVENOUS | Status: DC | PRN
  Administered 2019-10-30: 20 mg via INTRAVENOUS
  Administered 2019-10-30: 30 mg via INTRAVENOUS
  Administered 2019-10-30: 150 mg via INTRAVENOUS

## 2019-10-30 MED ORDER — SUGAMMADEX SODIUM 200 MG/2ML IV SOLN
INTRAVENOUS | Status: DC | PRN
  Administered 2019-10-30: 200 mg via INTRAVENOUS

## 2019-10-30 MED ORDER — CHLORHEXIDINE GLUCONATE 0.12 % MT SOLN: 15.0000 mL | Freq: Once | OROMUCOSAL | Status: AC

## 2019-10-30 MED ORDER — ORAL CARE MOUTH RINSE: 15.0000 mL | Freq: Once | OROMUCOSAL | Status: AC

## 2019-10-30 MED ORDER — FENTANYL CITRATE (PF) 100 MCG/2ML IJ SOLN: 25.0000 ug | INTRAMUSCULAR | Status: DC | PRN

## 2019-10-30 MED ORDER — ACETAMINOPHEN 325 MG PO TABS: 325.0000 mg | ORAL_TABLET | ORAL | Status: DC | PRN

## 2019-10-30 SURGICAL SUPPLY — 74 items
BASKET BONE COLLECTION (BASKET) ×3 IMPLANT
BLADE CLIPPER SURG (BLADE) IMPLANT
BUR MATCHSTICK NEURO 3.0 LAGG (BURR) ×3 IMPLANT
BUR PRECISION FLUTE 5.0 (BURR) ×3 IMPLANT
CANISTER SUCT 3000ML PPV (MISCELLANEOUS) ×3 IMPLANT
CARTRIDGE OIL MAESTRO DRILL (MISCELLANEOUS) ×1 IMPLANT
CNTNR URN SCR LID CUP LEK RST (MISCELLANEOUS) ×2 IMPLANT
CONT SPEC 4OZ STRL OR WHT (MISCELLANEOUS) ×4
COVER BACK TABLE 24X17X13 BIG (DRAPES) IMPLANT
COVER BACK TABLE 60X90IN (DRAPES) ×3 IMPLANT
COVER WAND RF STERILE (DRAPES) ×3 IMPLANT
DECANTER SPIKE VIAL GLASS SM (MISCELLANEOUS) ×3 IMPLANT
DERMABOND ADVANCED (GAUZE/BANDAGES/DRESSINGS) ×2
DERMABOND ADVANCED .7 DNX12 (GAUZE/BANDAGES/DRESSINGS) ×1 IMPLANT
DIFFUSER DRILL AIR PNEUMATIC (MISCELLANEOUS) ×3 IMPLANT
DRAPE C-ARM 42X72 X-RAY (DRAPES) ×3 IMPLANT
DRAPE C-ARMOR (DRAPES) ×3 IMPLANT
DRAPE LAPAROTOMY 100X72X124 (DRAPES) ×3 IMPLANT
DRAPE SURG 17X23 STRL (DRAPES) ×3 IMPLANT
DRSG OPSITE POSTOP 4X6 (GAUZE/BANDAGES/DRESSINGS) ×3 IMPLANT
DURAPREP 26ML APPLICATOR (WOUND CARE) ×3 IMPLANT
ELECT BLADE 4.0 EZ CLEAN MEGAD (MISCELLANEOUS) ×3
ELECT REM PT RETURN 9FT ADLT (ELECTROSURGICAL) ×3
ELECTRODE BLDE 4.0 EZ CLN MEGD (MISCELLANEOUS) ×1 IMPLANT
ELECTRODE REM PT RTRN 9FT ADLT (ELECTROSURGICAL) ×1 IMPLANT
GAUZE 4X4 16PLY RFD (DISPOSABLE) IMPLANT
GAUZE SPONGE 4X4 12PLY STRL (GAUZE/BANDAGES/DRESSINGS) ×3 IMPLANT
GLOVE BIO SURGEON STRL SZ8 (GLOVE) ×6 IMPLANT
GLOVE BIOGEL PI IND STRL 8 (GLOVE) ×2 IMPLANT
GLOVE BIOGEL PI IND STRL 8.5 (GLOVE) ×2 IMPLANT
GLOVE BIOGEL PI INDICATOR 8 (GLOVE) ×4
GLOVE BIOGEL PI INDICATOR 8.5 (GLOVE) ×4
GLOVE ECLIPSE 8.0 STRL XLNG CF (GLOVE) ×6 IMPLANT
GLOVE EXAM NITRILE XL STR (GLOVE) IMPLANT
GOWN STRL REUS W/ TWL LRG LVL3 (GOWN DISPOSABLE) IMPLANT
GOWN STRL REUS W/ TWL XL LVL3 (GOWN DISPOSABLE) ×3 IMPLANT
GOWN STRL REUS W/TWL 2XL LVL3 (GOWN DISPOSABLE) IMPLANT
GOWN STRL REUS W/TWL LRG LVL3 (GOWN DISPOSABLE)
GOWN STRL REUS W/TWL XL LVL3 (GOWN DISPOSABLE) ×6
IMPL TLX20 9X11X26 20D (Cage) ×2 IMPLANT
KIT BASIN OR (CUSTOM PROCEDURE TRAY) ×3 IMPLANT
KIT INFUSE X SMALL 1.4CC (Orthopedic Implant) ×3 IMPLANT
KIT POSITION SURG JACKSON T1 (MISCELLANEOUS) ×3 IMPLANT
KIT TURNOVER KIT B (KITS) ×3 IMPLANT
MILL MEDIUM DISP (BLADE) ×3 IMPLANT
NEEDLE HYPO 21X1.5 ECLIPSE (NEEDLE) ×3 IMPLANT
NEEDLE HYPO 25X1 1.5 SAFETY (NEEDLE) ×3 IMPLANT
NEEDLE SPNL 18GX3.5 QUINCKE PK (NEEDLE) IMPLANT
NS IRRIG 1000ML POUR BTL (IV SOLUTION) ×3 IMPLANT
OIL CARTRIDGE MAESTRO DRILL (MISCELLANEOUS) ×3
PACK LAMINECTOMY NEURO (CUSTOM PROCEDURE TRAY) ×3 IMPLANT
PAD ARMBOARD 7.5X6 YLW CONV (MISCELLANEOUS) ×9 IMPLANT
PATTIES SURGICAL .5 X.5 (GAUZE/BANDAGES/DRESSINGS) IMPLANT
PATTIES SURGICAL .5 X1 (DISPOSABLE) IMPLANT
PATTIES SURGICAL 1X1 (DISPOSABLE) IMPLANT
ROD RELIN-O LORD 5.5X65MM (Rod) ×6 IMPLANT
SCREW LOCK RELINE 5.5 TULIP (Screw) ×18 IMPLANT
SCREW RELINE-O POLY 5.5X40 (Screw) ×6 IMPLANT
SCREW RELINE-O POLY 6.5X40 (Screw) ×12 IMPLANT
SPONGE LAP 4X18 RFD (DISPOSABLE) IMPLANT
SPONGE SURGIFOAM ABS GEL 100 (HEMOSTASIS) ×3 IMPLANT
STAPLER SKIN PROX WIDE 3.9 (STAPLE) IMPLANT
SUT VIC AB 1 CT1 18XBRD ANBCTR (SUTURE) ×2 IMPLANT
SUT VIC AB 1 CT1 8-18 (SUTURE) ×4
SUT VIC AB 2-0 CT1 18 (SUTURE) ×6 IMPLANT
SUT VIC AB 3-0 SH 8-18 (SUTURE) ×6 IMPLANT
SYR 20CC LL (SYRINGE) ×3 IMPLANT
SYR 5ML LL (SYRINGE) IMPLANT
SYR BULB IRRIG 60ML STRL (SYRINGE) ×3 IMPLANT
TLX20 IMPLANT 9X11X26 20D (Cage) ×6 IMPLANT
TOWEL GREEN STERILE (TOWEL DISPOSABLE) ×3 IMPLANT
TOWEL GREEN STERILE FF (TOWEL DISPOSABLE) ×3 IMPLANT
TRAY FOLEY MTR SLVR 16FR STAT (SET/KITS/TRAYS/PACK) ×3 IMPLANT
WATER STERILE IRR 1000ML POUR (IV SOLUTION) ×3 IMPLANT

## 2019-10-30 NOTE — Op Note (Addendum)
10/30/2019  11:39 AM  PATIENT:  Barbara Rhodes  64 y.o. female  PRE-OPERATIVE DIAGNOSIS:  Spondylolisthesis, Lumbar region, herniated lumbar disc, spinal stenosis, degenerative disc disease, lumbar radiculopathy, lumbago, sagittal plane imbalance  POST-OPERATIVE DIAGNOSIS:  Spondylolisthesis, Lumbar region, herniated lumbar disc, spinal stenosis, degenerative disc disease, lumbar radiculopathy, lumbago, sagittal plane imbalance  PROCEDURE:  Procedure(s) with comments: Right Lumbar 3-4 Lumbar 4-5 Transforaminal lumbar interbody fusion (Right) - 3C/RM 21 with segmental instrumentation and posterolateral arthrodesis  SURGEON:  Surgeon(s) and Role:    Barbara Levine, MD - Primary  PHYSICIAN ASSISTANT: Barbara Peers, NP  ASSISTANTS: Poteat, RN   ANESTHESIA:   general  EBL:  250 mL   BLOOD ADMINISTERED:none  DRAINS: none   LOCAL MEDICATIONS USED:  MARCAINE    and LIDOCAINE   SPECIMEN:  No Specimen  DISPOSITION OF SPECIMEN:  N/A  COUNTS:  YES  TOURNIQUET:  * No tourniquets in log *  DICTATION: Patient is 64 year old woman with  HNP, spondylosis, mobile spondylolisthesis, stenosis, DDD, radiculopathy L 34 and L 45 levels. He has a severe right leg weakness. She has sagittal plane imbalance.  It was elected to take her to surgery for decompression and fusion at L 34 and L 45 levels.    Procedure: Patient was placed in a prone position on the [place] table after smooth and uncomplicated induction of general endotracheal anesthesia. Her low back was prepped and draped in usual sterile fashion with betadine scrub and DuraPrep after marking relevant spinal anatomy with C arm. Area of incision was infiltrated with local lidocaine. Incision was made to the lumbodorsal fascia was incised and exposure was performed of the L 3 - L 4 spinous processes laminae facet joint and transverse processes. Intraoperative x-ray was obtained which confirmed correct orientation. A total right hemi- laminectomy  of L 3 and L 4 was performed with disarticulation of the facet joints at this level and thorough decompression was performed of both L 3, L 4 and L 5 nerve roots along with the common dural tube. There was densely adherent spondylytic material compressing the thecal sac and both L4 and L5 nerve roots.  A thorough discectomy and preparation of the endplates was performed at both the L 34 and L 45 levels.  The interspaces were packed with extra small BMP and 10 cc of bone autograft, and 9 mm x 11 x 26 mm expandable titanium TLIF spacers at each level. Bone autograft was packed within the cage at each level.   The posterolateral region was extensively decorticated and pedicle probes were placed at L 3, L 4 and L 5 bilaterally. Intraoperative fluoroscopy confirmed correct orientationin the AP and lateral plane. 40 x 6.5 mm pedicle screws were placed at L 5 bilaterally and 40 x 6.5 mm screws placed at L 4  bilaterally and  40 x 5.75mm screws were placed at L 3 bilaterally.  Final x-rays demonstrated well-positioned interbody grafts and pedicle screw fixation. 65 mm lordotic rods were placed and locked down in situ and the posterolateral region was packed with the remaining BMP and bone autograft  on the left. Incision was infiltrated 20 cc of long-acting Marcaine was used in the posterolateral soft tissue. Fascia was closed with 1 Vicryl sutures skin edges were reapproximated 2 and 3-0 Vicryl sutures. The wound is dressed with Dermabond and an occlusive dressing.   the patient was extubated in the operating room and taken to recovery in stable satisfactory condition he tolerated traction well counts were correct at  the end of the case.   PLAN OF CARE: Admit to inpatient   PATIENT DISPOSITION:  PACU - hemodynamically stable.   Delay start of Pharmacological VTE agent (>24hrs) due to surgical blood loss or risk of bleeding: yes

## 2019-10-30 NOTE — Anesthesia Procedure Notes (Signed)
Procedure Name: Intubation Date/Time: 10/30/2019 7:40 AM Performed by: Jenne Campus, CRNA Pre-anesthesia Checklist: Patient identified, Emergency Drugs available, Suction available and Patient being monitored Patient Re-evaluated:Patient Re-evaluated prior to induction Oxygen Delivery Method: Circle System Utilized Preoxygenation: Pre-oxygenation with 100% oxygen Induction Type: IV induction Ventilation: Mask ventilation without difficulty Laryngoscope Size: Glidescope and 3 Grade View: Grade I Tube type: Oral Tube size: 7.0 mm Number of attempts: 1 Airway Equipment and Method: Stylet and Oral airway Placement Confirmation: ETT inserted through vocal cords under direct vision,  positive ETCO2 and breath sounds checked- equal and bilateral Secured at: 21 cm Tube secured with: Tape Dental Injury: Teeth and Oropharynx as per pre-operative assessment

## 2019-10-30 NOTE — Brief Op Note (Addendum)
10/30/2019  11:39 AM  PATIENT:  Barbara Rhodes  64 y.o. female  PRE-OPERATIVE DIAGNOSIS:  Spondylolisthesis, Lumbar region, herniated lumbar disc, spinal stenosis, degenerative disc disease, lumbar radiculopathy, lumbago, sagittal plane imbalance  POST-OPERATIVE DIAGNOSIS:  Spondylolisthesis, Lumbar region, herniated lumbar disc, spinal stenosis, degenerative disc disease, lumbar radiculopathy, lumbago, sagittal plane imbalance  PROCEDURE:  Procedure(s) with comments: Right Lumbar 3-4 Lumbar 4-5 Transforaminal lumbar interbody fusion (Right) - 3C/RM 21 with segmental instrumentation and posterolateral arthrodesis  SURGEON:  Surgeon(s) and Role:    Erline Levine, MD - Primary  PHYSICIAN ASSISTANT: Glenford Peers, NP  ASSISTANTS: Poteat, RN   ANESTHESIA:   general  EBL:  250 mL   BLOOD ADMINISTERED:none  DRAINS: none   LOCAL MEDICATIONS USED:  MARCAINE    and LIDOCAINE   SPECIMEN:  No Specimen  DISPOSITION OF SPECIMEN:  N/A  COUNTS:  YES  TOURNIQUET:  * No tourniquets in log *  DICTATION: Patient is 64 year old woman with  HNP, spondylosis, mobile spondylolisthesis, stenosis, DDD, radiculopathy L 34 and L 45 levels. He has a severe right leg weakness. She has sagittal plane imbalance.  It was elected to take her to surgery for decompression and fusion at L 34 and L 45 levels.    Procedure: Patient was placed in a prone position on the [place] table after smooth and uncomplicated induction of general endotracheal anesthesia. Her low back was prepped and draped in usual sterile fashion with betadine scrub and DuraPrep after marking relevant spinal anatomy with C arm. Area of incision was infiltrated with local lidocaine. Incision was made to the lumbodorsal fascia was incised and exposure was performed of the L 3 - L 4 spinous processes laminae facet joint and transverse processes. Intraoperative x-ray was obtained which confirmed correct orientation. A total right hemi- laminectomy  of L 3 and L 4 was performed with disarticulation of the facet joints at this level and thorough decompression was performed of both L 3, L 4 and L 5 nerve roots along with the common dural tube. There was densely adherent spondylytic material compressing the thecal sac and both L4 and L5 nerve roots.  A thorough discectomy and preparation of the endplates was performed at both the L 34 and L 45 levels.  The interspaces were packed with extra small BMP and 10 cc of bone autograft, and 9 mm x 11 x 26 mm expandable titanium TLIF spacers at each level. Bone autograft was packed within the cage at each level.   The posterolateral region was extensively decorticated and pedicle probes were placed at L 3, L 4 and L 5 bilaterally. Intraoperative fluoroscopy confirmed correct orientationin the AP and lateral plane. 40 x 6.5 mm pedicle screws were placed at L 5 bilaterally and 40 x 6.5 mm screws placed at L 4  bilaterally and  40 x 5.63mm screws were placed at L 3 bilaterally.  Final x-rays demonstrated well-positioned interbody grafts and pedicle screw fixation. 65 mm lordotic rods were placed and locked down in situ and the posterolateral region was packed with the remaining BMP and bone autograft  on the left. Incision was infiltrated 20 cc of long-acting Marcaine was used in the posterolateral soft tissue. Fascia was closed with 1 Vicryl sutures skin edges were reapproximated 2 and 3-0 Vicryl sutures. The wound is dressed with Dermabond and an occlusive dressing.   the patient was extubated in the operating room and taken to recovery in stable satisfactory condition he tolerated traction well counts were correct at  the end of the case.   PLAN OF CARE: Admit to inpatient   PATIENT DISPOSITION:  PACU - hemodynamically stable.   Delay start of Pharmacological VTE agent (>24hrs) due to surgical blood loss or risk of bleeding: yes

## 2019-10-30 NOTE — Transfer of Care (Signed)
Immediate Anesthesia Transfer of Care Note  Patient: Breann Losano  Procedure(s) Performed: Right Lumbar 3-4 Lumbar 4-5 Transforaminal lumbar interbody fusion (Right Back)  Patient Location: PACU  Anesthesia Type:General  Level of Consciousness: oriented, drowsy and patient cooperative  Airway & Oxygen Therapy: Patient Spontanous Breathing and Patient connected to face mask oxygen  Post-op Assessment: Report given to RN and Post -op Vital signs reviewed and stable  Post vital signs: Reviewed  Last Vitals:  Vitals Value Taken Time  BP 126/66 10/30/19 1139  Temp    Pulse 77 10/30/19 1140  Resp 12 10/30/19 1140  SpO2 100 % 10/30/19 1140  Vitals shown include unvalidated device data.  Last Pain:  Vitals:   10/30/19 0644  TempSrc:   PainSc: 0-No pain         Complications: No complications documented.

## 2019-10-30 NOTE — Evaluation (Signed)
Occupational Therapy Evaluation Patient Details Name: Barbara Rhodes MRN: [id number] DOB: May 16, 1955 Today's Date: 10/30/2019    History of Present Illness Right Lumbar 3-4 Lumbar 4-5 Transforaminal lumbar interbody fusion and currently still having RLE weakness   Clinical Impression   This 64 yo female admitted and underwent above presents to acute OT with PLOF of being independent to Mod I with all basic ADLs. Currently due to back precautions and decreased balance pt is min A-minguard A for all mobility and setup/S- max A for all basic ADLs. She will benefit from acute OT without need for follow up.     Follow Up Recommendations  No OT follow up;Supervision - Intermittent    Equipment Recommendations  None recommended by OT       Precautions / Restrictions Precautions Precautions: Back Precaution Booklet Issued: Yes (comment) Precaution Comments: Off for trips to bathroom and showering Required Braces or Orthoses: Spinal Brace Spinal Brace: Applied in sitting position Restrictions Weight Bearing Restrictions: No      Mobility Bed Mobility Overal bed mobility: Needs Assistance Bed Mobility: Rolling;Sidelying to Sit;Sit to Sidelying Rolling: Supervision Sidelying to sit: Min guard     Sit to sidelying: Min assist General bed mobility comments: A for legs back into bed, VCs for technique  Transfers Overall transfer level: Needs assistance Equipment used: Straight cane Transfers: Sit to/from Stand Sit to Stand: Min guard         General transfer comment: Min A to ambulate in hallway--SPC in one hand and held onto rail in hallway with other hand intermittently    Balance Overall balance assessment: Needs assistance Sitting-balance support: No upper extremity supported;Feet supported Sitting balance-Leahy Scale: Fair     Standing balance support: Single extremity supported Standing balance-Leahy Scale: Fair                             ADL  either performed or assessed with clinical judgement   ADL Overall ADL's : Needs assistance/impaired Eating/Feeding: Independent;Sitting   Grooming: Supervision/safety;Set up;Standing   Upper Body Bathing: Supervision/ safety;Set up;Sitting   Lower Body Bathing: Moderate assistance Lower Body Bathing Details (indicate cue type and reason): min guard A sit<>stand Upper Body Dressing : Set up;Supervision/safety;Sitting   Lower Body Dressing: Maximal assistance Lower Body Dressing Details (indicate cue type and reason): min guard A sit<>stand Toilet Transfer: Minimal assistance;Ambulation Toilet Transfer Details (indicate cue type and reason): SPC Toileting- Clothing Manipulation and Hygiene: Min guard;Sit to/from stand               Vision Patient Visual Report: No change from baseline              Pertinent Vitals/Pain Pain Assessment: No/denies pain     Hand Dominance Right   Extremity/Trunk Assessment Upper Extremity Assessment Upper Extremity Assessment: Overall WFL for tasks assessed           Communication Communication Communication: No difficulties   Cognition Arousal/Alertness: Awake/alert Behavior During Therapy: WFL for tasks assessed/performed Overall Cognitive Status: Within Functional Limits for tasks assessed                                                Home Living Family/patient expects to be discharged to:: Private residence Living Arrangements: Spouse/significant other Available Help at Discharge: Family;Available PRN/intermittently Type of Home: House  Home Access: Stairs to enter Entrance Stairs-Number of Steps: 10 Entrance Stairs-Rails: Right;Left Home Layout: Two level Alternate Level Stairs-Number of Steps: 5 landing 7 Alternate Level Stairs-Rails: Right Bathroom Shower/Tub: Walk-in shower;Curtain   Bathroom Toilet: Handicapped height     Home Equipment: Shower seat - built in;Cane - single point;Walker - 2  wheels;Adaptive equipment Adaptive Equipment: Reacher        Prior Functioning/Environment Level of Independence: Independent                 OT Problem List: Decreased range of motion;Impaired balance (sitting and/or standing)      OT Treatment/Interventions: Self-care/ADL training;DME and/or AE instruction;Patient/family education;Balance training    OT Goals(Current goals can be found in the care plan section) Acute Rehab OT Goals Patient Stated Goal: hopeful for home tomorrow OT Goal Formulation: With patient Time For Goal Achievement: 11/13/19 Potential to Achieve Goals: Good  OT Frequency: Min 2X/week              AM-PAC OT "6 Clicks" Daily Activity     Outcome Measure Help from another person eating meals?: None Help from another person taking care of personal grooming?: A Little Help from another person toileting, which includes using toliet, bedpan, or urinal?: A Little Help from another person bathing (including washing, rinsing, drying)?: A Lot Help from another person to put on and taking off regular upper body clothing?: A Little Help from another person to put on and taking off regular lower body clothing?: A Lot 6 Click Score: 17   End of Session Equipment Utilized During Treatment: Gait belt;Back brace Nurse Communication: Mobility status (No DME needs OT)  Activity Tolerance: Patient tolerated treatment well Patient left: in bed;with call bell/phone within reach;with family/visitor present  OT Visit Diagnosis: Other abnormalities of gait and mobility (R26.89);Unsteadiness on feet (R26.81)                Time: 2440-1027 OT Time Calculation (min): 30 min Charges:  OT General Charges $OT Visit: 1 Visit OT Evaluation $OT Eval Moderate Complexity: 1 Mod OT Treatments $Self Care/Home Management : 8-22 mins  Barbara Rhodes, OTR/L Acute [employer] Pager [phone] Office [phone]     Barbara Rhodes 10/30/2019, 4:59 PM

## 2019-10-30 NOTE — Interval H&P Note (Signed)
History and Physical Interval Note:  10/30/2019 7:20 AM  Barbara Rhodes  has presented today for surgery, with the diagnosis of Spondylolisthesis, Lumbar region.  The various methods of treatment have been discussed with the patient and family. After consideration of risks, benefits and other options for treatment, the patient has consented to  Procedure(s) with comments: Right Lumbar 3-4 Lumbar 4-5 Transforaminal lumbar interbody fusion (Right) - 3C/RM 21 as a surgical intervention.  The patient's history has been reviewed, patient examined, no change in status, stable for surgery.  I have reviewed the patient's chart and labs.  Questions were answered to the patient's satisfaction.     Peggyann Shoals

## 2019-10-30 NOTE — Progress Notes (Signed)
Awake, alert, conversant.  Full bilateral lower extremity strength without numbness.  Minimal incisional pain.  Doing well.

## 2019-10-30 NOTE — Anesthesia Postprocedure Evaluation (Signed)
Anesthesia Post Note  Patient: Aleyna Cueva  Procedure(s) Performed: Right Lumbar 3-4 Lumbar 4-5 Transforaminal lumbar interbody fusion (Right Back)     Patient location during evaluation: PACU Anesthesia Type: General Level of consciousness: awake and alert Pain management: pain level controlled Vital Signs Assessment: post-procedure vital signs reviewed and stable Respiratory status: spontaneous breathing, nonlabored ventilation, respiratory function stable and patient connected to nasal cannula oxygen Cardiovascular status: blood pressure returned to baseline and stable Postop Assessment: no apparent nausea or vomiting Anesthetic complications: no   No complications documented.  Last Vitals:  Vitals:   10/30/19 1226 10/30/19 1245  BP:  118/70  Pulse:  71  Resp:  16  Temp: 36.9 C 36.4 C  SpO2:  96%    Last Pain:  Vitals:   10/30/19 1245  TempSrc: Oral  PainSc:                  Myrella Fahs

## 2019-10-31 MED ORDER — METHOCARBAMOL 500 MG PO TABS: 500.0000 mg | ORAL_TABLET | Freq: Three times a day (TID) | ORAL | 0 refills | Status: DC

## 2019-10-31 MED ORDER — OMEGA 3 1200 MG PO CAPS: ORAL_CAPSULE | ORAL | 0 refills | Status: DC

## 2019-10-31 MED ORDER — CELECOXIB 200 MG PO CAPS: ORAL_CAPSULE | ORAL | 0 refills | Status: DC

## 2019-10-31 MED ORDER — OXYCODONE-ACETAMINOPHEN 5-325 MG PO TABS: 1.0000 | ORAL_TABLET | ORAL | 0 refills | Status: DC | PRN

## 2019-10-31 NOTE — Evaluation (Signed)
Physical Therapy Evaluation Patient Details Name: Barbara Rhodes MRN: [id number] DOB: 11/05/1955 Today's Date: 10/31/2019   History of Present Illness  Pt is a 64 y.o. F with significant PMH of cervical surgery who presents with spondylolisthesis s/p R L3-5 TLIF.   Clinical Impression  Pt presents s/p procedure listed above. Pt recalling 3/3 spinal precautions. Ambulating 400 feet with a cane without physical difficulty. Negotiated 10 steps with right railing and cane to simulate home set up. Education provided regarding precautions, car transfer technique, appropriate DME, activity recommendations. Pt with no further questions/concerns. No further acute PT needs. Thank you for this consult.     Follow Up Recommendations Outpatient PT    Equipment Recommendations  None recommended by PT    Recommendations for Other Services       Precautions / Restrictions Precautions Precautions: Back Precaution Booklet Issued: Yes (comment) Precaution Comments: Pt recalling 3/3 precautions Required Braces or Orthoses: Spinal Brace Spinal Brace: Applied in sitting position Restrictions Weight Bearing Restrictions: No      Mobility  Bed Mobility Overal bed mobility: Modified Independent             General bed mobility comments: good log roll technique  Transfers Overall transfer level: Modified independent Equipment used: Straight cane                Ambulation/Gait Ambulation/Gait assistance: Modified independent (Device/Increase time) Gait Distance (Feet): 400 Feet Assistive device: Straight cane Gait Pattern/deviations: Step-through pattern;Antalgic     General Gait Details: Mildly antalgic gait pattern, increased bilateral foot external rotation, no gross imbalance  Stairs Stairs: Yes Stairs assistance: Supervision Stair Management: One rail Right;One rail Left;With cane Number of Stairs: 10 General stair comments: Cues for step by step, technique, sequencing.  Pt with use of R rail and cane in left hand ascending, L rail and cane in right hand descending  Wheelchair Mobility    Modified Rankin (Stroke Patients Only)       Balance Overall balance assessment: Needs assistance Sitting-balance support: Feet supported Sitting balance-Leahy Scale: Normal     Standing balance support: Single extremity supported Standing balance-Leahy Scale: Fair                               Pertinent Vitals/Pain Pain Assessment: Faces Faces Pain Scale: Hurts a little bit Pain Location: back Pain Descriptors / Indicators: Operative site guarding Pain Intervention(s): Monitored during session    Home Living Family/patient expects to be discharged to:: Private residence Living Arrangements: Spouse/significant other Available Help at Discharge: Family;Available PRN/intermittently Type of Home: House Home Access: Stairs to enter Entrance Stairs-Rails: Psychiatric nurse of Steps: 10 Home Layout: Two level Home Equipment: Shower seat - built in;Cane - single point;Walker - 2 wheels;Adaptive equipment      Prior Function Level of Independence: Independent               Hand Dominance   Dominant Hand: Right    Extremity/Trunk Assessment   Upper Extremity Assessment Upper Extremity Assessment: Overall WFL for tasks assessed    Lower Extremity Assessment Lower Extremity Assessment: RLE deficits/detail;LLE deficits/detail RLE Deficits / Details: Strength 5/5 LLE Deficits / Details: Strength 5/5    Cervical / Trunk Assessment Cervical / Trunk Assessment: Other exceptions Cervical / Trunk Exceptions: s/p R L3-5 TLIF  Communication   Communication: No difficulties  Cognition Arousal/Alertness: Awake/alert Behavior During Therapy: WFL for tasks assessed/performed Overall Cognitive Status: Within Functional Limits  for tasks assessed                                        General Comments       Exercises     Assessment/Plan    PT Assessment Patent does not need any further PT services  PT Problem List         PT Treatment Interventions      PT Goals (Current goals can be found in the Care Plan section)  Acute Rehab PT Goals Patient Stated Goal: continue to work on getting stronger PT Goal Formulation: All assessment and education complete, DC therapy    Frequency     Barriers to discharge        Co-evaluation               AM-PAC PT "6 Clicks" Mobility  Outcome Measure Help needed turning from your back to your side while in a flat bed without using bedrails?: None Help needed moving from lying on your back to sitting on the side of a flat bed without using bedrails?: None Help needed moving to and from a bed to a chair (including a wheelchair)?: None Help needed standing up from a chair using your arms (e.g., wheelchair or bedside chair)?: None Help needed to walk in hospital room?: None Help needed climbing 3-5 steps with a railing? : None 6 Click Score: 24    End of Session Equipment Utilized During Treatment: Back brace;Gait belt Activity Tolerance: Patient tolerated treatment well Patient left: Other (comment) (handoff to OT) Nurse Communication: Mobility status PT Visit Diagnosis: Pain;Difficulty in walking, not elsewhere classified (R26.2) Pain - part of body:  (back)    Time: 2694-8546 PT Time Calculation (min) (ACUTE ONLY): 16 min   Charges:   PT Evaluation $PT Eval Low Complexity: [place], PT, DPT Acute Rehabilitation Services Pager [phone] Office [phone]   Deno Etienne 10/31/2019, 8:22 AM

## 2019-10-31 NOTE — Progress Notes (Signed)
Occupational Therapy Treatment Patient Details Name: Barbara Rhodes MRN: [id number] DOB: Jan 03, 1956 Today's Date: 10/31/2019    History of present illness Pt is a 64 y.o. F with significant PMH of cervical surgery who presents with spondylolisthesis s/p R L3-5 TLIF.    OT comments  Patient with excellent follow through of back precautions during ADL this am.  All questions answered and patient is ready to go home later this morning.  She is essentially Mod I with all mobility in the room, self care and toilet transfers. No OT required in the acute care setting.    Follow Up Recommendations  No OT follow up;Supervision - Intermittent    Equipment Recommendations  None recommended by OT    Recommendations for Other Services      Precautions / Restrictions Precautions Precautions: Back Precaution Booklet Issued: Yes (comment) Precaution Comments: Pt recalling 3/3 precautions Required Braces or Orthoses: Spinal Brace Spinal Brace: Applied in sitting position Restrictions Weight Bearing Restrictions: No              ADL either performed or assessed with clinical judgement   ADL       Grooming: Wash/dry hands;Wash/dry face;Oral care;Moderate assistance           Upper Body Dressing : Modified independent;Sitting   Lower Body Dressing: Modified independent;Sit to/from stand   Toilet Transfer: Modified Independent           Functional mobility during ADLs: Modified independent;Cane                         Cognition Arousal/Alertness: Awake/alert Behavior During Therapy: WFL for tasks assessed/performed Overall Cognitive Status: Within Functional Limits for tasks assessed                                                            Pertinent Vitals/ Pain       Pain Assessment: Faces Faces Pain Scale: Hurts a little bit Pain Location: back Pain Descriptors / Indicators: Operative site guarding Pain Intervention(s):  Monitored during session  Home Living Family/patient expects to be discharged to:: Private residence Living Arrangements: Spouse/significant other Available Help at Discharge: Family;Available PRN/intermittently Type of Home: House Home Access: Stairs to enter [employer] of Steps: 10 Entrance Stairs-Rails: Right;Left Home Layout: Two level Alternate Level Stairs-Number of Steps: 5 landing 7 Alternate Level Stairs-Rails: Right Bathroom Shower/Tub: Walk-in shower;Curtain   Bathroom Toilet: Handicapped height     Home Equipment: Shower seat - built in;Cane - single point;Walker - 2 wheels;Adaptive equipment Adaptive Equipment: Reacher        Prior Functioning/Environment Level of Independence: Independent            Frequency  Min 2X/week        Progress Toward Goals  OT Goals(current goals can now be found in the care plan section)  Progress towards OT goals: Progressing toward goals  Acute Rehab OT Goals Patient Stated Goal: Get back to being independent OT Goal Formulation: With patient Time For Goal Achievement: 11/13/19 Potential to Achieve Goals: Good  Plan Discharge plan remains appropriate    Co-evaluation                 AM-PAC OT "6 Clicks" Daily Activity     Outcome Measure  Help from another person eating meals?: None Help from another person taking care of personal grooming?: None Help from another person toileting, which includes using toliet, bedpan, or urinal?: None Help from another person bathing (including washing, rinsing, drying)?: None Help from another person to put on and taking off regular upper body clothing?: None Help from another person to put on and taking off regular lower body clothing?: None 6 Click Score: 24    End of Session Equipment Utilized During Treatment: Back brace;Other (comment) (SPC)  OT Visit Diagnosis: Other abnormalities of gait and mobility (R26.89);Unsteadiness on feet (R26.81)    Activity Tolerance Patient tolerated treatment well   Patient Left in bed;with call bell/phone within reach   Nurse Communication          Time: 8867-7373 OT Time Calculation (min): 21 min  Charges: OT General Charges $OT Visit: 1 Visit OT Treatments $Self Care/Home Management : 8-22 mins  10/31/2019  Rich, OTR/L  Acute Rehabilitation Services  Office:  [place] 10/31/2019, 8:52 AM

## 2019-10-31 NOTE — Discharge Summary (Signed)
Physician Discharge Summary  Patient ID: Jailin Manocchio MRN: [id number] DOB/AGE: [birth year] 66 y.o.  Admit date: 10/30/2019 Discharge date: 10/31/2019  Admission Diagnoses:  Spondylolisthesis, Lumbar region, herniated lumbar disc, spinal stenosis, degenerative disc disease, lumbar radiculopathy, lumbago, sagittal plane imbalance  Discharge Diagnoses:  Active Problems:   Spondylolisthesis of lumbar region  Spondylolisthesis, Lumbar region, herniated lumbar disc, spinal stenosis, degenerative disc disease, lumbar radiculopathy, lumbago, sagittal plane imbalance  Discharged Condition: good  Hospital Course: the patient was admitted on 10/30/2019 and taken to the operating room for right Lumbar 3-4 Lumbar 4-5 Transforaminal lumbar interbody fusion (Right).   The patient tolerated the procedure well. The patient taken to the recovery room and then to the floor in stable condition. The hospital course was routine. There was no complications. The wound remains clean, dry and intact. The patient remained afebrile with stable vital signs, and tolerated a regular diet. The pain is well controlled with oral pain medications. Pt states that she feels like her right leg weakness is getting better. She is scheduled to follow up with Dr. Vertell Limber in 2-3 weeks in clinic. Pt is able to ambulating in her room with a cane without any difficulty. Patient is ready and eager for discharge.   Discharge Exam: Blood pressure 105/60, pulse 89, temperature 98.6 F (37 C), temperature source Oral, resp. rate 16, SpO2 100 %. Alert and oriented x 4 PERRLA CN II-XII grossly intact MAE, strength test RLU dorsiflexion: 5/5, plantar flexion: 5/5.   Incision is covered with Honeycomb dressing and Steri Strips; Dressing is clean, dry, and intact  Disposition: Discharge disposition: 01-Home or Self Care        Allergies as of 10/31/2019      Reactions   Penicillins Anaphylaxis   DID THE REACTION INVOLVE: Swelling of  the face/tongue/throat, SOB, or low BP? Yes Sudden or severe rash/hives, skin peeling, or the inside of the mouth or nose? No Did it require medical treatment? Yes When did it last happen?63 years ago If all above answers are "NO", may proceed with cephalosporin use.      Medication List    TAKE these medications   acetaminophen 500 MG tablet Commonly known as: TYLENOL Take 500-1,000 mg by mouth every 6 (six) hours as needed (pain.).   Allergy Eye 0.025-0.3 % ophthalmic solution Generic drug: naphazoline-pheniramine Place 1 drop into both eyes 4 (four) times daily as needed for allergies.   celecoxib 200 MG capsule Commonly known as: CELEBREX Restart in 5 days What changed:   how much to take  how to take this  when to take this  additional instructions   CITRACAL PETITES/VITAMIN D PO Take 1 tablet by mouth daily.   Daily Digestive Probiotic Caps Take 1 capsule by mouth at bedtime.   fluorouracil 5 % cream Commonly known as: EFUDEX fluorouracil 5 % topical cream  APPLY ON THE SKIN TWICE A DAY FOR 2 WEEKS   Magnesium 250 MG Tabs Take 250 mg by mouth every Monday, Wednesday, and Friday.   methocarbamol 500 MG tablet Commonly known as: Robaxin Take 1 tablet (500 mg total) by mouth 3 (three) times daily.   Omega 3 1200 MG Caps Restart in 5 days What changed:   medication strength  how much to take  how to take this  when to take this  additional instructions   OVER THE COUNTER MEDICATION Take 1 capsule by mouth daily. Andrographis Supplement   oxyCODONE-acetaminophen 5-325 MG tablet Commonly known as: Percocet Take 1 tablet  by mouth every 4 (four) hours as needed for severe pain.   Potassium 99 MG Tabs Take 99 mg by mouth daily.   PRESERVISION AREDS 2 PO Take 1 tablet by mouth in the morning and at bedtime.   triamterene-hydrochlorothiazide 37.5-25 MG tablet Commonly known as: MAXZIDE-25 Take 1 tablet by mouth daily.   Vitamin D3 125  MCG (5000 UT) Tabs Take 5,000 Units by mouth daily.        Signed: Osie Cheeks 10/31/2019, 9:59 AM

## 2019-10-31 NOTE — Plan of Care (Signed)
Patient alert and oriented, mae's well, voiding adequate amount of urine, swallowing without difficulty, no c/o pain at time of discharge. Patient discharged home with family. Script and discharged instructions given to patient. Patient and family stated understanding of instructions given. Patient has an appointment with Dr.Stern    

## 2019-10-31 NOTE — Discharge Instructions (Signed)
Wound Care Remove outer dressing in 2-3 days Leave incision open to air. You may shower. Do not scrub directly on incision.  Do not put any creams, lotions, or ointments on incision. Activity Walk each and every day, increasing distance each day. No lifting greater than 5 lbs.  Avoid bending, arching, and twisting. No driving for 2 weeks; may ride as a passenger locally. If provided with back brace, wear when out of bed.  It is not necessary to wear in bed. Diet Resume your normal diet.  Return to Work Will be discussed at you follow up appointment. Call Your Doctor If Any of These Occur Redness, drainage, or swelling at the wound.  Temperature greater than 101 degrees. Severe pain not relieved by pain medication. Incision starts to come apart. Follow Up Appt Call today for appointment in 2-3 weeks ([phone]) or for problems.  If you have any hardware placed in your spine, you will need an x-ray before your appointment. 

## 2019-11-02 ENCOUNTER — Encounter (HOSPITAL_COMMUNITY): Payer: Self-pay | Admitting: Neurosurgery

## 2019-11-03 MED FILL — Sodium Chloride IV Soln 0.9%: INTRAVENOUS | Qty: 1000 | Status: AC

## 2019-11-03 MED FILL — Heparin Sodium (Porcine) Inj 1000 Unit/ML: INTRAMUSCULAR | Qty: 30 | Status: AC

## 2020-05-23 IMAGING — CR DG CERVICAL SPINE 2 OR 3 VIEWS
2 series · 2 of 2 positions shown · non-contrast
Comparison: Radiographs December 31, 2017.

CLINICAL DATA: Elective cervical surgery.

EXAM:
CERVICAL SPINE - 2-3 VIEW

[xtable (1 of 2)]
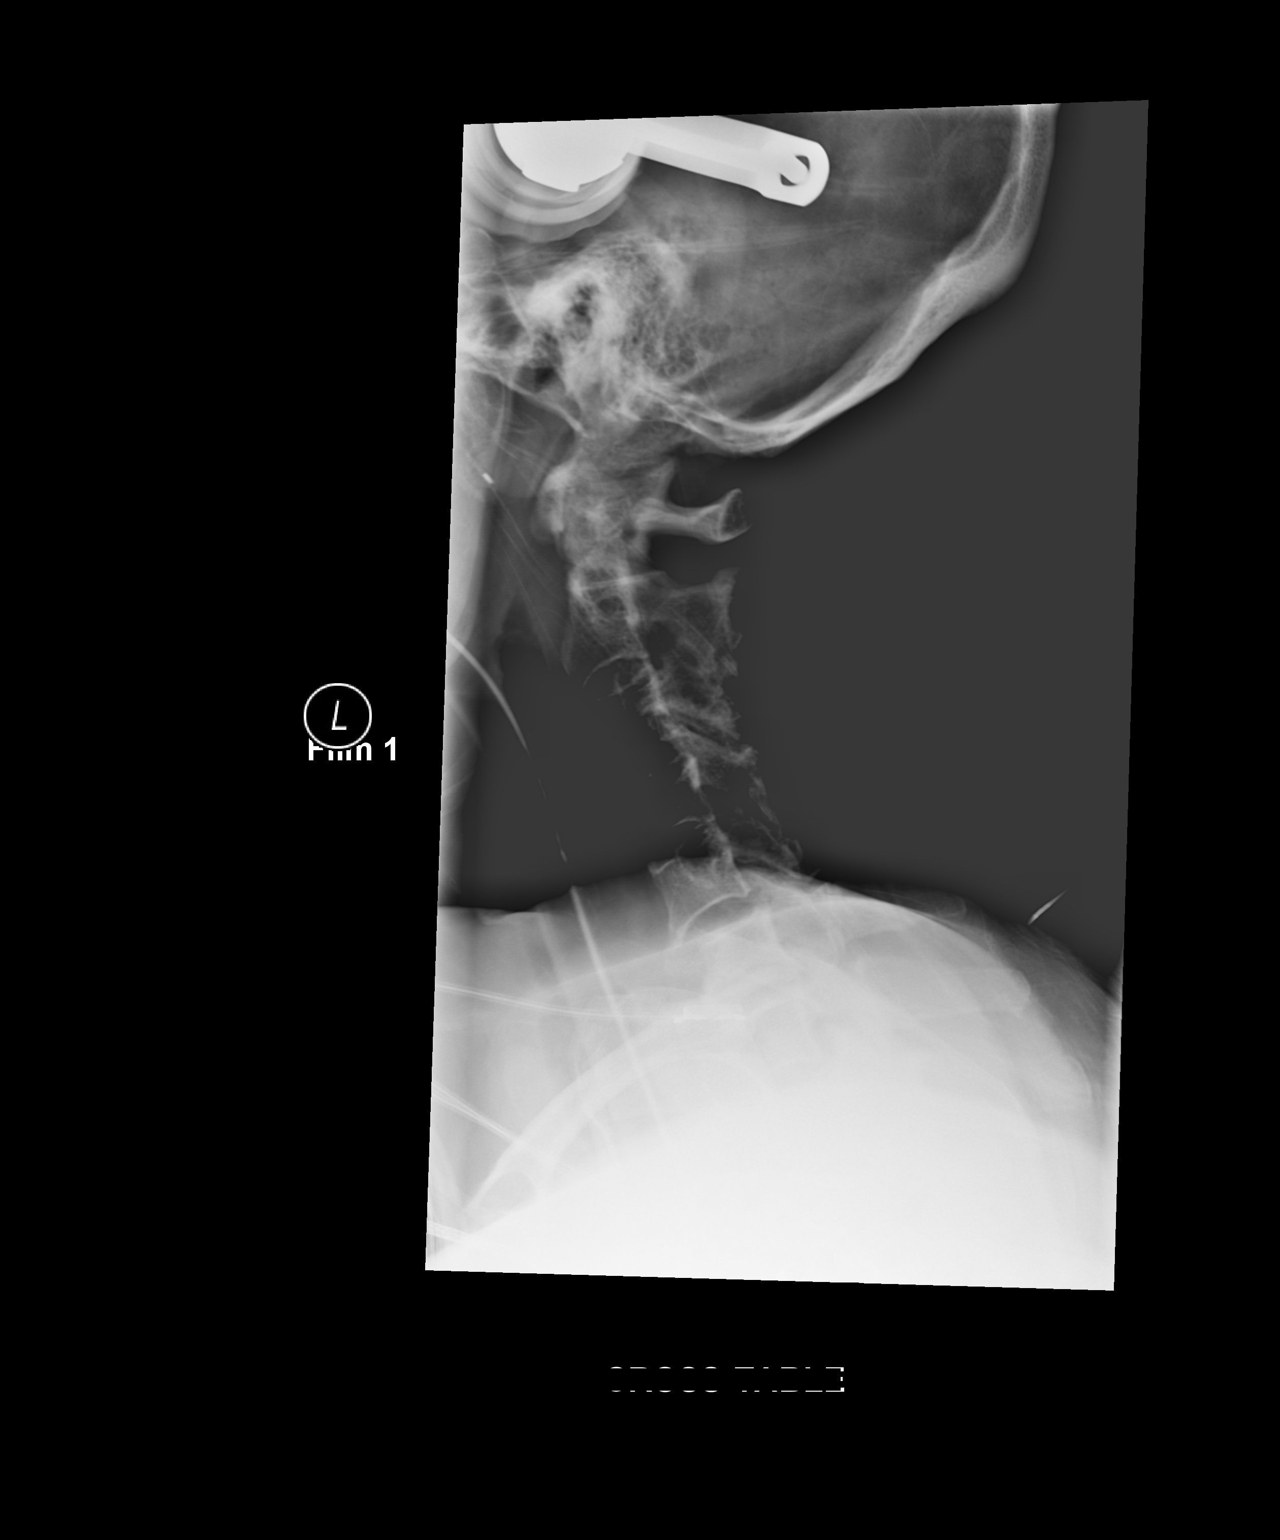

[xtable (2 of 2)]
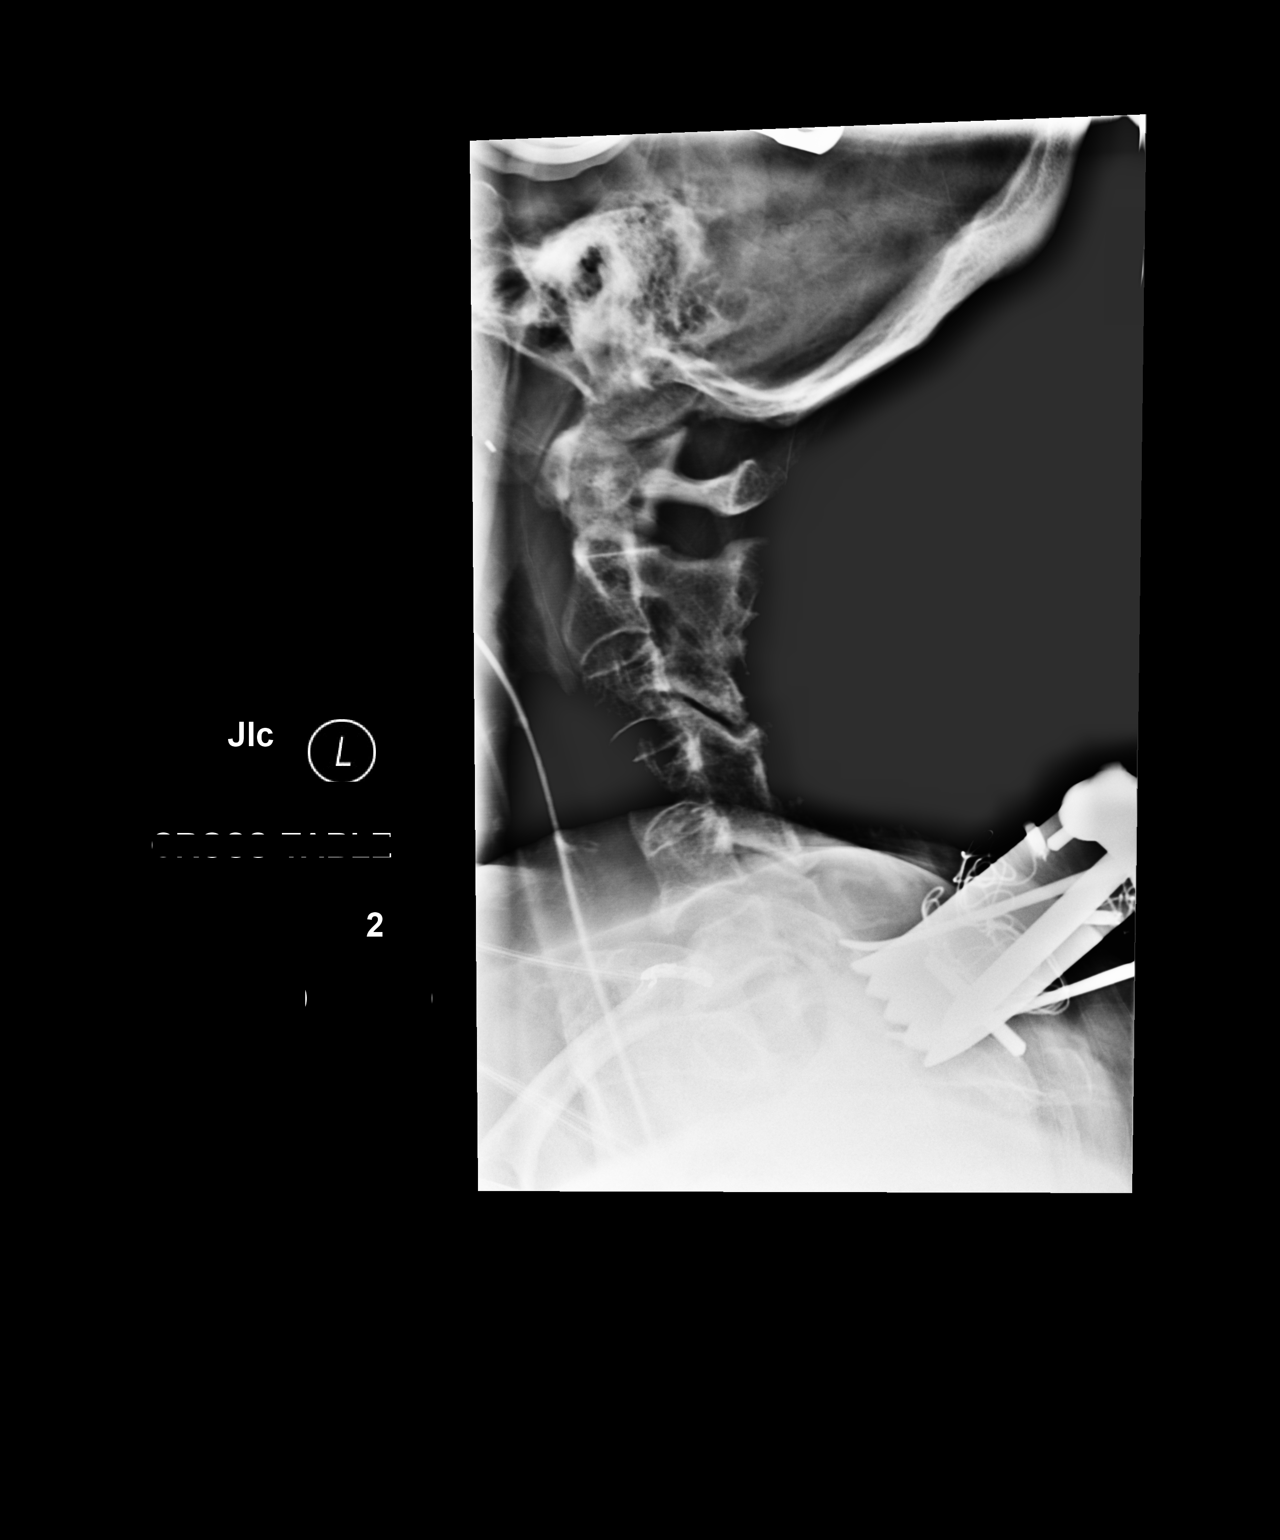

[2 of 2 positions shown; findings below may reference images not displayed]

FINDINGS: Two intraoperative fluoroscopic images were obtained of the cervical
spine. The first image demonstrates surgical probe directed toward
posterior interspinous space of C6-7. The second image demonstrates
2 surgical probes directed toward posterior portions of C6-7 and
C7-T1 disc spaces. Surgical retractors and other devices are seen
posterior to C7.
IMPRESSION: Surgical localization as described above. These results were called
by telephone at the time of interpretation on 01/23/2018 at [DATE] to
Dr. GYURKO TOU through the OR nurse, who verbally acknowledged
these results.

## 2020-08-04 DIAGNOSIS — M6281 Muscle weakness (generalized): Secondary | ICD-10-CM | POA: Insufficient documentation

## 2020-08-11 DIAGNOSIS — M1712 Unilateral primary osteoarthritis, left knee: Secondary | ICD-10-CM | POA: Insufficient documentation

## 2021-03-22 ENCOUNTER — Other Ambulatory Visit (HOSPITAL_COMMUNITY): Payer: Self-pay | Admitting: Obstetrics & Gynecology

## 2021-03-22 DIAGNOSIS — R221 Localized swelling, mass and lump, neck: Secondary | ICD-10-CM

## 2021-03-23 ENCOUNTER — Encounter (HOSPITAL_COMMUNITY): Payer: Self-pay | Admitting: Radiology

## 2021-03-23 NOTE — Progress Notes (Signed)
Patient Name  ?Barbara Rhodes Legal Sex  ?Female DOB  ?[birth year] SSN  ?[id number] Address  [place] [address] Phone  ?[phone] (Home)  ?[phone] (Mobile)  ? ? RE: US Lymph Node Biopsy ?Received: Today ?Arne Cleveland, MD  Garth Bigness D ?1. We can do it local only no sedation  ?2. Superficial nodes, we no longer hold anticoags for these  ? ?Thx  ?DDH   ?  ?   ?Previous Messages ?  ?----- Message -----  ?From: Garth Bigness D  ?Sent: 03/23/2021  11:50 AM EST  ?To: Arne Cleveland, MD  ?Subject: RE: US Lymph Node Biopsy                      ? ?Hi, this patient is asking can this be done without sedation because she does not have a driver. She also states that she is on Celebrax and states she usually has to stop it for procedures.  ?----- Message -----  ?From: Arne Cleveland, MD  ?Sent: 03/23/2021  11:32 AM EST  ?To: Jillyn Hidden  ?Subject: RE: US Lymph Node Biopsy                      ? ?Ok  ? ?[place] core L supraclav LAN  ?H/o breast ca  ? ?DDH  ? ? ? ?----- Message -----  ?From: Garth Bigness D  ?Sent: 03/22/2021   4:53 PM EST  ?To: Ir Procedure Requests  ?Subject: US Lymph Node Biopsy                          ? ?Procedure:   US Lymph Node Biopsy  ? ?Reason:  Neck Mass  ? ?History:  outside CT, US done at [facility], per order images being sent through [facility]  ? ?Provider:  Linda Hedges  ? ?Provider contact:  [phone]  ?

## 2021-03-23 NOTE — Progress Notes (Signed)
Patient Name  ?[name] Legal Sex  ?Female DOB  ?1955-03-04 SSN  ?[id number] Address  ?[place] [address] Phone  ?[phone] (Home)  ?[phone] (Mobile)  ? ? RE: US Lymph Node Biopsy ?Received: Today ?Arne Cleveland, MD  Garth Bigness D ?Ok  ? ?[place] core L supraclav LAN  ?H/o breast ca  ? ?DDH   ?  ?   ?Previous Messages ?  ?----- Message -----  ?From: Garth Bigness D  ?Sent: 03/22/2021   4:53 PM EST  ?To: Ir Procedure Requests  ?Subject: US Lymph Node Biopsy                          ? ?Procedure:   US Lymph Node Biopsy  ? ?Reason:  Neck Mass  ? ?History:  outside CT, US done at [facility], per order images being sent through [facility]  ? ?Provider:  Linda Hedges  ? ?Provider contact:  [phone]  ?

## 2021-03-30 ENCOUNTER — Other Ambulatory Visit: Payer: Self-pay | Admitting: Radiology

## 2021-04-03 ENCOUNTER — Other Ambulatory Visit (HOSPITAL_COMMUNITY): Payer: Self-pay | Admitting: Obstetrics & Gynecology

## 2021-04-03 ENCOUNTER — Ambulatory Visit (HOSPITAL_COMMUNITY)
Admission: RE | Admit: 2021-04-03 | Discharge: 2021-04-03 | Disposition: A | Discharge status: Home / Self Care | Payer: BC Managed Care – PPO | Source: Ambulatory Visit | Attending: Obstetrics & Gynecology | Admitting: Obstetrics & Gynecology

## 2021-04-03 ENCOUNTER — Other Ambulatory Visit: Payer: Self-pay

## 2021-04-03 DIAGNOSIS — Z923 Personal history of irradiation: Secondary | ICD-10-CM | POA: Insufficient documentation

## 2021-04-03 DIAGNOSIS — R221 Localized swelling, mass and lump, neck: Secondary | ICD-10-CM | POA: Diagnosis present

## 2021-04-03 DIAGNOSIS — Z853 Personal history of malignant neoplasm of breast: Secondary | ICD-10-CM | POA: Diagnosis not present

## 2021-04-03 DIAGNOSIS — Z9011 Acquired absence of right breast and nipple: Secondary | ICD-10-CM | POA: Diagnosis not present

## 2021-04-03 HISTORY — PX: IR US GUIDE BX ASP/DRAIN: IMG2392

## 2021-04-03 MED ORDER — LIDOCAINE HCL (PF) 1 % IJ SOLN
INTRAMUSCULAR | Status: DC | PRN
  Administered 2021-04-03: 10 mL

## 2021-04-03 MED ORDER — LIDOCAINE HCL 1 % IJ SOLN
INTRAMUSCULAR | Status: AC
  Filled 2021-04-03: qty 20

## 2021-04-03 NOTE — Progress Notes (Signed)
? ?Chief Complaint: ?Patient was seen in consultation today for left supraclavicular lymph node biopsy at the request of [place] ? ?Referring Physician(s): ?Morris,Megan ? ?Supervising Physician: Sandi Mariscal ? ?Patient Status: [facility] - Out-pt ? ?History of Present Illness: ?Barbara Rhodes is a 66 y.o. female  ? ?Hx Breast cancer 2000 ?Rt mastectomy then with radiation ?Followed in a Breast Ca study in [place] where original cancer diagnosis was determined ?Was released several yrs ago ? ?Did test +Brca 2  4 yrs ago-- had oopherectomy and salpingectomy ?Dtr also Brca 2 ? ?Pt felt left [place] LN while in shower ?[place]; mobile; NT ? ?CT 03/11/21:  ?IMPRESSION: ?1. Multiple enlarged lymph nodes clustered within the left ?supraclavicular region, with the largest node measuring 1.5 cm in ?short axis. Findings are nonspecific, and could be reactive in ?nature. Possible nodal metastatic disease or lymphoproliferative ?disorder could also be considered. These would likely be amenable to ?ultrasound-guided histologic sampling. ?2. No other adenopathy or other abnormality within the neck ? ?Scheduled for biopsy of L SCLN in IR today ? ?Past Medical History:  ?Diagnosis Date  ? Arthritis   ? Breast cancer ([place]) 2000  ? Stage 2B, ER+/PR-/Her2-  ? Colon polyps   ? Family history of adverse reaction to anesthesia 1959  ? Brother passed away at 41 years old from malignant hyperthermia.  ? Family history of breast cancer   ? Family history of prostate cancer   ? Hypertension   ? ? ?Past Surgical History:  ?Procedure Laterality Date  ? ABDOMINAL HYSTERECTOMY    ? BACK SURGERY    ? BREAST SURGERY    ? COLONOSCOPY WITH PROPOFOL    ? pt said propofol burned her throat when pushed  ? MASTECTOMY Right 2000  ? MASTECTOMY Right 2000  ? POSTERIOR CERVICAL LAMINECTOMY Left 01/23/2018  ? Procedure: LEFT CERVICAL SEVEN- THORACIC ONE LAMINOTOMY,FORAMINOTOMY, MICRODISCECTOMY;  Surgeon: Jovita Gamma, MD;  Location: [place];  Service:  Neurosurgery;  Laterality: Left;  ? ROTATOR CUFF REPAIR    ? TONSILLECTOMY    ? TRANSFORAMINAL LUMBAR INTERBODY FUSION (TLIF) WITH PEDICLE SCREW FIXATION 2 LEVEL Right 10/30/2019  ? Procedure: Right Lumbar 3-4 Lumbar 4-5 Transforaminal lumbar interbody fusion;  Surgeon: Erline Levine, MD;  Location: [place];  Service: Neurosurgery;  Laterality: Right;  3C/RM 21  ? TUBAL LIGATION    ? WISDOM TOOTH EXTRACTION    ? ? ?Allergies: ?Penicillins ? ?Medications: ?Prior to Admission medications   ?Medication Sig Start Date End Date Taking? Authorizing Provider  ?acetaminophen (TYLENOL) 500 MG tablet Take 500-1,000 mg by mouth every 6 (six) hours as needed (pain.).   Yes [provider]  ?Calcium Citrate-Vitamin D (CALCIUM CITRATE +D PO) Take 1 tablet by mouth daily.   Yes [provider]  ?celecoxib (CELEBREX) 200 MG capsule Restart in 5 days ?Patient taking differently: Take 200 mg by mouth daily. 10/31/19  Yes Osie Cheeks, NP  ?Cholecalciferol (VITAMIN D3) 125 MCG (5000 UT) TABS Take 5,000 Units by mouth daily.   Yes [provider]  ?LUTEIN PO Take 1 tablet by mouth daily.   Yes [provider]  ?MAGNESIUM PO Take 1 tablet by mouth daily.   Yes [provider]  ?Multiple Vitamin (MULTIVITAMIN WITH MINERALS) TABS tablet Take 1 tablet by mouth daily.   Yes [provider]  ?naphazoline-pheniramine (ALLERGY EYE) 0.025-0.3 % ophthalmic solution Place 1 drop into both eyes 4 (four) times daily as needed for allergies.   Yes [provider]  ?  Omega-3 Fatty Acids (FISH OIL ULTRA) 1400 MG CAPS Take 1,400 mg by mouth daily.   Yes [provider]  ?OVER THE COUNTER MEDICATION Take 400 mg by mouth daily. Andrographis Supplement   Yes [provider]  ?Potassium 99 MG TABS Take 99 mg by mouth daily.   Yes [provider]  ?Probiotic Product (DAILY DIGESTIVE PROBIOTIC) CAPS Take 1 capsule by mouth 3 (three) times a week.   Yes [provider]   ?traMADol (ULTRAM) 50 MG tablet Take 50 mg by mouth every 6 (six) hours as needed for moderate pain.   Yes [provider]  ?triamterene-hydrochlorothiazide (MAXZIDE-25) 37.5-25 MG tablet Take 1 tablet by mouth daily.  09/22/17  Yes [provider]  ?  ? ?Family History  ?Problem Relation Age of Onset  ? Breast cancer Paternal Grandmother 8  ? Prostate cancer Father   ?     dx in his 67s  ? Colon polyps Father 77  ? Dementia Mother   ? Hypertension Mother   ? Dementia Maternal Aunt   ? Stroke Maternal Grandfather   ? Rheum arthritis Paternal Grandfather   ? Breast cancer Other   ?     MGM's sister, post-menopausal breast cancer  ? Breast cancer Cousin 7  ?     paternal 2nd cousin  ? ? ?Social History  ? ?Socioeconomic History  ? Marital status: Married  ?  Spouse name: Not on file  ? Number of children: Not on file  ? Years of education: Not on file  ? Highest education level: Not on file  ?Occupational History  ? Not on file  ?Tobacco Use  ? Smoking status: Never  ? Smokeless tobacco: Never  ?Vaping Use  ? Vaping Use: Never used  ?Substance and Sexual Activity  ? Alcohol use: No  ? Drug use: No  ? Sexual activity: Not on file  ?Other Topics Concern  ? Not on file  ?Social History Narrative  ? Not on file  ? ?Social Determinants of Health  ? ?Financial Resource Strain: Not on file  ?Food Insecurity: Not on file  ?Transportation Needs: Not on file  ?Physical Activity: Not on file  ?Stress: Not on file  ?Social Connections: Not on file  ? ? ?Review of Systems: A 12 point ROS discussed and pertinent positives are indicated in the HPI above.  All other systems are negative. ? ?Vital Signs: ?There were no vitals taken for this visit. ? ?Imaging: ?No results found. ? ?Labs: ? ?CBC: ?No results for input(s): WBC, HGB, HCT, PLT in the last 8760 hours. ? ?COAGS: ?No results for input(s): INR, APTT in the last 8760 hours. ? ?BMP: ?No results for input(s): NA, K, CL, CO2, GLUCOSE, BUN, CALCIUM, CREATININE,  GFRNONAA, GFRAA in the last 8760 hours. ? ?Invalid input(s): CMP ? ?LIVER FUNCTION TESTS: ?No results for input(s): BILITOT, AST, ALT, ALKPHOS, PROT, ALBUMIN in the last 8760 hours. ? ?TUMOR MARKERS: ?No results for input(s): AFPTM, CEA, CA199, CHROMGRNA in the last 8760 hours. ? ?Assessment and Plan: ? ?Scheduled today for left supraclavicular lymph node biopsy ?Risks and benefits of left SCLN bx was discussed with the patient and/or patient's family including, but not limited to bleeding, infection, damage to adjacent structures or low yield requiring additional tests. ? ?All of the questions were answered and there is agreement to proceed. ? ?Consent signed and in chart.  ? ?Thank you for this interesting consult.  I greatly enjoyed meeting Barbara Rhodes  and look forward to participating in their care.  A copy of this report was sent to the requesting provider on this date. ? ?Electronically Signed: ?Lavonia Drafts, PA-C ?04/03/2021, 12:50 PM ? ? ?I spent a total of  30 Minutes   in face to face in clinical consultation, greater than 50% of which was counseling/coordinating care for L supraclavicular LN Bx  ?

## 2021-04-03 NOTE — Procedures (Signed)
Pre Procedure Dx: Left supraclavicular left adenopathy ?Post Procedural Dx: Same ? ?Technically successful US guided biopsy of indeterminate left supraclavicular lymph node.  ? ?EBL: None ?No immediate complications.  ? ?Ronny Bacon, MD ?Pager #: [phone] ? ? ?

## 2021-04-07 LAB — SURGICAL PATHOLOGY

## 2021-05-02 ENCOUNTER — Other Ambulatory Visit: Payer: Self-pay | Admitting: Internal Medicine

## 2021-05-02 ENCOUNTER — Other Ambulatory Visit (HOSPITAL_COMMUNITY): Payer: Self-pay | Admitting: Internal Medicine

## 2021-05-02 DIAGNOSIS — C8511 Unspecified B-cell lymphoma, lymph nodes of head, face, and neck: Secondary | ICD-10-CM

## 2021-05-10 ENCOUNTER — Encounter (HOSPITAL_COMMUNITY)
Admission: RE | Admit: 2021-05-10 | Discharge: 2021-05-10 | Disposition: A | Discharge status: Home / Self Care | Payer: BC Managed Care – PPO | Source: Ambulatory Visit | Attending: Internal Medicine | Admitting: Internal Medicine

## 2021-05-10 DIAGNOSIS — C8511 Unspecified B-cell lymphoma, lymph nodes of head, face, and neck: Secondary | ICD-10-CM | POA: Diagnosis present

## 2021-05-10 LAB — GLUCOSE, CAPILLARY: Glucose-Capillary: 125 mg/dL — ABNORMAL HIGH (ref 70–99)

## 2021-05-10 MED ORDER — FLUDEOXYGLUCOSE F - 18 (FDG) INJECTION
10.4000 | Freq: Once | INTRAVENOUS | Status: AC | PRN
  Administered 2021-05-10: 10.4 via INTRAVENOUS

## 2021-05-19 ENCOUNTER — Ambulatory Visit: Payer: Self-pay | Admitting: Surgery

## 2021-05-19 ENCOUNTER — Telehealth: Payer: Self-pay | Admitting: Hematology and Oncology

## 2021-05-19 DIAGNOSIS — R590 Localized enlarged lymph nodes: Secondary | ICD-10-CM

## 2021-05-19 NOTE — Telephone Encounter (Signed)
Scheduled appt per 4/27 referral. Pt is aware of appt date and time. Pt is aware to arrive 15 mins prior to appt time and to bring and updated insurance card. Pt is aware of appt location.   ?

## 2021-05-22 DIAGNOSIS — C8330 Diffuse large B-cell lymphoma, unspecified site: Secondary | ICD-10-CM

## 2021-05-22 HISTORY — DX: Diffuse large B-cell lymphoma, unspecified site: C83.30

## 2021-05-25 ENCOUNTER — Inpatient Hospital Stay: Payer: BC Managed Care – PPO | Attending: Hematology and Oncology | Admitting: Hematology and Oncology

## 2021-05-25 ENCOUNTER — Inpatient Hospital Stay: Payer: BC Managed Care – PPO

## 2021-05-25 ENCOUNTER — Encounter: Payer: Self-pay | Admitting: Hematology and Oncology

## 2021-05-25 ENCOUNTER — Other Ambulatory Visit: Payer: Self-pay

## 2021-05-25 VITALS — BP 138/75 | HR 92 | Temp 97.8°F | Ht 64.0 in | Wt 208.9 lb

## 2021-05-25 DIAGNOSIS — Z8371 Family history of colonic polyps: Secondary | ICD-10-CM | POA: Diagnosis not present

## 2021-05-25 DIAGNOSIS — Z923 Personal history of irradiation: Secondary | ICD-10-CM | POA: Diagnosis not present

## 2021-05-25 DIAGNOSIS — Z853 Personal history of malignant neoplasm of breast: Secondary | ICD-10-CM | POA: Diagnosis not present

## 2021-05-25 DIAGNOSIS — Z79899 Other long term (current) drug therapy: Secondary | ICD-10-CM | POA: Insufficient documentation

## 2021-05-25 DIAGNOSIS — Z9221 Personal history of antineoplastic chemotherapy: Secondary | ICD-10-CM | POA: Diagnosis not present

## 2021-05-25 DIAGNOSIS — Z8042 Family history of malignant neoplasm of prostate: Secondary | ICD-10-CM

## 2021-05-25 DIAGNOSIS — Z5189 Encounter for other specified aftercare: Secondary | ICD-10-CM | POA: Insufficient documentation

## 2021-05-25 DIAGNOSIS — C8590 Non-Hodgkin lymphoma, unspecified, unspecified site: Secondary | ICD-10-CM | POA: Diagnosis not present

## 2021-05-25 DIAGNOSIS — Z803 Family history of malignant neoplasm of breast: Secondary | ICD-10-CM | POA: Diagnosis not present

## 2021-05-25 DIAGNOSIS — Z9011 Acquired absence of right breast and nipple: Secondary | ICD-10-CM | POA: Insufficient documentation

## 2021-05-25 LAB — CBC WITH DIFFERENTIAL/PLATELET
Abs Immature Granulocytes: 0.02 10*3/uL (ref 0.00–0.07)
Basophils Absolute: 0 10*3/uL (ref 0.0–0.1)
Basophils Relative: 0 %
Eosinophils Absolute: 0.1 10*3/uL (ref 0.0–0.5)
Eosinophils Relative: 2 %
HCT: 27.7 % — ABNORMAL LOW (ref 36.0–46.0)
Hemoglobin: 9.2 g/dL — ABNORMAL LOW (ref 12.0–15.0)
Immature Granulocytes: 1 %
Lymphocytes Relative: 45 %
Lymphs Abs: 2 10*3/uL (ref 0.7–4.0)
MCH: 30.5 pg (ref 26.0–34.0)
MCHC: 33.2 g/dL (ref 30.0–36.0)
MCV: 91.7 fL (ref 80.0–100.0)
Monocytes Absolute: 0.8 10*3/uL (ref 0.1–1.0)
Monocytes Relative: 17 %
Neutro Abs: 1.5 10*3/uL — ABNORMAL LOW (ref 1.7–7.7)
Neutrophils Relative %: 35 %
Platelets: 127 10*3/uL — ABNORMAL LOW (ref 150–400)
RBC: 3.02 MIL/uL — ABNORMAL LOW (ref 3.87–5.11)
RDW: 17.1 % — ABNORMAL HIGH (ref 11.5–15.5)
Smear Review: NORMAL
WBC: 4.4 10*3/uL (ref 4.0–10.5)
nRBC: 0 % (ref 0.0–0.2)

## 2021-05-25 LAB — HEPATITIS PANEL, ACUTE
HCV Ab: NONREACTIVE
Hep A IgM: NONREACTIVE
Hep B C IgM: NONREACTIVE
Hepatitis B Surface Ag: NONREACTIVE

## 2021-05-25 LAB — COMPREHENSIVE METABOLIC PANEL
ALT: 14 U/L (ref 0–44)
AST: 16 U/L (ref 15–41)
Albumin: 3.5 g/dL (ref 3.5–5.0)
Alkaline Phosphatase: 88 U/L (ref 38–126)
Anion gap: 9 (ref 5–15)
BUN: 16 mg/dL (ref 8–23)
CO2: 29 mmol/L (ref 22–32)
Calcium: 9.1 mg/dL (ref 8.9–10.3)
Chloride: 99 mmol/L (ref 98–111)
Creatinine, Ser: 1.01 mg/dL — ABNORMAL HIGH (ref 0.44–1.00)
GFR, Estimated: >60 mL/min (ref >60)
Glucose, Bld: 125 mg/dL — ABNORMAL HIGH (ref 70–99)
Potassium: 3.4 mmol/L — ABNORMAL LOW (ref 3.5–5.1)
Sodium: 137 mmol/L (ref 135–145)
Total Bilirubin: 0.9 mg/dL (ref 0.3–1.2)
Total Protein: 6.6 g/dL (ref 6.5–8.1)

## 2021-05-25 LAB — PHOSPHORUS: Phosphorus: 5 mg/dL — ABNORMAL HIGH (ref 2.5–4.6)

## 2021-05-25 LAB — MAGNESIUM: Magnesium: 2.1 mg/dL (ref 1.7–2.4)

## 2021-05-25 LAB — URIC ACID: Uric Acid, Serum: 6.4 mg/dL (ref 2.5–7.1)

## 2021-05-25 LAB — LACTATE DEHYDROGENASE: LDH: 231 U/L — ABNORMAL HIGH (ref 98–192)

## 2021-05-25 NOTE — Progress Notes (Signed)
[facility] ?CONSULT NOTE ? ?Patient Care Team: ?Skillman, Katherine E, PA-C as PCP - General (Physician Assistant) ? ?CHIEF COMPLAINTS/PURPOSE OF CONSULTATION:  ?lymphadenopathy ? ?ASSESSMENT & PLAN:  ?No problem-specific Assessment & Plan notes found for this encounter. ? ?Orders Placed This Encounter  ?Procedures  ? CBC with Differential/Platelet  ?  Standing Status:   Standing  ?  Number of Occurrences:   22  ?  Standing Expiration Date:   05/26/2022  ? Comprehensive metabolic panel  ?  Standing Status:   Standing  ?  Number of Occurrences:   33  ?  Standing Expiration Date:   05/26/2022  ? Lactate dehydrogenase  ?  Standing Status:   Future  ?  Number of Occurrences:   1  ?  Standing Expiration Date:   05/26/2022  ? Uric acid  ?  Standing Status:   Future  ?  Number of Occurrences:   1  ?  Standing Expiration Date:   05/25/2022  ? Magnesium  ?  Standing Status:   Future  ?  Number of Occurrences:   1  ?  Standing Expiration Date:   05/25/2022  ? Phosphorus  ?  Standing Status:   Future  ?  Number of Occurrences:   1  ?  Standing Expiration Date:   05/25/2022  ? Hepatitis panel, acute  ?  Standing Status:   Future  ?  Number of Occurrences:   1  ?  Standing Expiration Date:   05/25/2022  ? ?This is 66 yr old female patient with palpable left cervical mass, needle core biopsy showed atypical lymphoid proliferation for involvement by a B-cell lymphoproliferative process particularly large cell lymphoma, PET with diffuse lymphadenopathy working above and below diaphragm, marrow infiltration referred to medical oncology for recommendations. ?We discussed that PET suggests aggressive lymphoma, pathology however not conclusive, concerning for involvement by large cell lymphoma. ?Ideally we will need excisional biopsy for better characterization. However given rapidly progressive lymphadenopathy, we discussed about consider first cycle of chemotherapy inpatient and simultaneous excisional LN biopsy. ?She understands in  the event, excisional biopsy shows a different lymphoma, we will have to change treatment based on this. She is also at high risk of TLS ?While I was talking about chemo, she started becoming very emotional and said she doesn't ever want to take chemo again. Although she tolerated this well, she said the emotional toll was too much and she cant go through this again. ?We discussed that without treatment, this is fatal and life expectancy can be in the order of few months. She wants to think about it. She will let us know asap. ? ?If she chooses not to proceed with treatment, she should consider hospice. ?We may have to choose R CHOP without doxorubicin since she says she may have had life time maximum dose. ? ?Praveena Iruku MD ? ? ?HISTORY OF PRESENTING ILLNESS:  ?Megen Strother Rorabaugh 66 y.o. female is here because of lymphadenopathy ? ?This is a 66 yr old female patient seen at [facility] with chief complaint of lymphadenopathy requested care to be transferred to [facility] for evaluation and treatment. ?She has noted left neck mass since January which has progressively increased. 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 She has noted progressive fatigue in the past few months. ?She was very anxious, tearful,   emotional that she never imagined to have to go to oncology office again. She says she doesn't want to go through chemotherapy, doesn't want to do all of that again and feel very lonely. She then tells me that she cruised through chemo without significant side effects but the emotional toll was very hard. She has 3 kids, nearest child is about 2 hrs away, husband works. She says there is no good support, no friends, and it is very hard for her to get through treatment. Without any support. ?She has tried support groups and she says  they are very depressing. She knew that this was large cell lymphoma or an aggressive lymphoma and she knows her time is limited. ? ?PET imaging, findings of adenopathy in the neck, chest and in the abdomen in this patient with reported history of lymphoma, categorized as [place] 5. Diffuse marrow uptake. ? ?Rest of the pertinent 10 point ROS reviewed and neg. ? ?MEDICAL HISTORY:  ?Past Medical History:  ?Diagnosis Date  ? Arthritis   ? Breast cancer ([place]) 2000  ? Stage 2B, ER+/PR-/Her2-  ? Colon polyps   ? Family history of adverse reaction to anesthesia 1959  ? Brother passed away at 27 years old from malignant hyperthermia.  ? Family history of breast cancer   ? Family history of prostate cancer   ? Hypertension   ? ? ?SURGICAL HISTORY: ?Past Surgical History:  ?Procedure Laterality Date  ? ABDOMINAL HYSTERECTOMY    ? BACK SURGERY    ? BREAST SURGERY    ? COLONOSCOPY WITH PROPOFOL    ? pt said propofol burned her throat when pushed  ? IR US GUIDE BX ASP/DRAIN  04/03/2021  ? MASTECTOMY Right 2000  ? MASTECTOMY Right 2000  ? POSTERIOR CERVICAL LAMINECTOMY Left 01/23/2018  ? Procedure: LEFT CERVICAL SEVEN- THORACIC ONE LAMINOTOMY,FORAMINOTOMY, MICRODISCECTOMY;  Surgeon: Jovita Gamma, MD;  Location: [place];  Service: Neurosurgery;  Laterality: Left;  ? ROTATOR CUFF REPAIR    ? TONSILLECTOMY    ? TRANSFORAMINAL LUMBAR INTERBODY FUSION (TLIF) WITH PEDICLE SCREW FIXATION 2 LEVEL Right 10/30/2019  ? Procedure: Right Lumbar 3-4 Lumbar 4-5 Transforaminal lumbar interbody fusion;  Surgeon: Erline Levine, MD;  Location: [place];  Service: Neurosurgery;  Laterality: Right;  3C/RM 21  ? TUBAL LIGATION    ? WISDOM TOOTH EXTRACTION    ? ? ?SOCIAL HISTORY: ?Social History  ? ?Socioeconomic History  ? Marital status: Married  ?  Spouse name: Not on file  ? Number of children: Not on file  ? Years of education: Not on file  ? Highest education level: Not on file  ?Occupational History  ? Not on file  ?Tobacco Use  ? Smoking status:  Never  ? Smokeless tobacco: Never  ?Vaping Use  ? Vaping Use: Never used  ?Substance and Sexual Activity  ? Alcohol use: No  ? Drug use: No  ? Sexual activity: Not on file  ?Other Topics Concern  ? Not on file  ?Social History Narrative  ? Not on file  ? ?Social Determinants of Health  ? ?Financial Resource Strain: Not on file  ?Food Insecurity: Not on file  ?Transportation Needs: Not on file  ?Physical Activity: Not on file  ?Stress: Not on file  ?Social Connections: Not on file  ?Intimate Partner Violence: Not on file  ? ? ?FAMILY HISTORY: ?Family History  ?Problem Relation Age of Onset  ? Breast cancer Paternal Grandmother 36  ? Prostate cancer Father   ?  dx in his 40s  ? Colon polyps Father 21  ? Dementia Mother   ? Hypertension Mother   ? Dementia Maternal Aunt   ? Stroke Maternal Grandfather   ? Rheum arthritis Paternal Grandfather   ? Breast cancer Other   ?     MGM's sister, post-menopausal breast cancer  ? Breast cancer Cousin 41  ?     paternal 2nd cousin  ? ? ?ALLERGIES:  is allergic to penicillins. ? ?MEDICATIONS:  ?Current Outpatient Medications  ?Medication Sig Dispense Refill  ? acetaminophen (TYLENOL) 500 MG tablet Take 500-1,000 mg by mouth every 6 (six) hours as needed (pain.).    ? Calcium Citrate-Vitamin D (CALCIUM CITRATE +D PO) Take 1 tablet by mouth daily.    ? celecoxib (CELEBREX) 200 MG capsule Restart in 5 days (Patient taking differently: Take 200 mg by mouth daily.) 1 capsule 0  ? Cholecalciferol (VITAMIN D3) 125 MCG (5000 UT) TABS Take 5,000 Units by mouth daily.    ? LUTEIN PO Take 1 tablet by mouth daily.    ? MAGNESIUM PO Take 1 tablet by mouth daily.    ? Multiple Vitamin (MULTIVITAMIN WITH MINERALS) TABS tablet Take 1 tablet by mouth daily.    ? naphazoline-pheniramine (ALLERGY EYE) 0.025-0.3 % ophthalmic solution Place 1 drop into both eyes 4 (four) times daily as needed for allergies.    ? Omega-3 Fatty Acids (FISH OIL ULTRA) 1400 MG CAPS Take 1,400 mg by mouth daily.    ?  OVER THE COUNTER MEDICATION Take 400 mg by mouth daily. Andrographis Supplement    ? Potassium 99 MG TABS Take 99 mg by mouth daily.    ? Probiotic Product (DAILY DIGESTIVE PROBIOTIC) CAPS Take 1 capsule

## 2021-05-26 ENCOUNTER — Other Ambulatory Visit: Payer: Self-pay | Admitting: *Deleted

## 2021-05-26 ENCOUNTER — Telehealth: Payer: Self-pay | Admitting: *Deleted

## 2021-05-26 MED ORDER — ALLOPURINOL 300 MG PO TABS: 300.0000 mg | ORAL_TABLET | Freq: Every day | ORAL | 1 refills | Status: DC

## 2021-05-26 NOTE — Telephone Encounter (Signed)
This RN contact [facility] per need of medical records - primarily chemo records for verification of previous chemo with use of anthracyclines due to need for R CHOP for new lymphoma diagnosis. ? ?Records are available in their current records not archived. ? ?Fax number verified as [phone]. ? ?Phone number for above facility is [phone]. ? ?ROI for chemo records sent to above with confirmation as received. ?

## 2021-05-26 NOTE — Addendum Note (Signed)
Addended by: Adaline Sill on: 05/26/2021 10:23 AM ? ? Modules accepted: Orders ? ?

## 2021-05-29 ENCOUNTER — Other Ambulatory Visit: Payer: Self-pay | Admitting: Hematology and Oncology

## 2021-05-29 ENCOUNTER — Telehealth: Payer: Self-pay | Admitting: *Deleted

## 2021-05-29 ENCOUNTER — Ambulatory Visit: Payer: Self-pay | Admitting: Surgery

## 2021-05-29 DIAGNOSIS — C8590 Non-Hodgkin lymphoma, unspecified, unspecified site: Secondary | ICD-10-CM | POA: Insufficient documentation

## 2021-05-29 DIAGNOSIS — C8330 Diffuse large B-cell lymphoma, unspecified site: Secondary | ICD-10-CM

## 2021-05-29 NOTE — Telephone Encounter (Signed)
This RN placed request for admission for chemo with Margo in bed control for 06/01/2021. ? ?High priority message sent to Drucie Opitz per above for prior authorization. ? ?This RN called pt and discussed above. ? ?She states " I have never spent the night in the hospital what should I expect and bring " ? ?This RN discussed possible procedures upon admission including biopsy- port placement ( pt requesting ) and need for ECHO. ?Discussed bringing items from home for comfort- including her own pajamas that allow for port use as well as pillow and or blankets she likes. ? ?Of note pt has started allopurinol. ?  ?Pt appreciated discussion as well as patience given last week at visit and then phone call on Friday. ? ?This note will be given to MD for review as well as any orders needed prior to admission ? ? ?

## 2021-05-29 NOTE — Progress Notes (Signed)

## 2021-05-30 ENCOUNTER — Encounter: Payer: Self-pay | Admitting: Hematology and Oncology

## 2021-05-30 ENCOUNTER — Other Ambulatory Visit: Payer: Self-pay | Admitting: Hematology and Oncology

## 2021-05-30 ENCOUNTER — Encounter: Payer: Self-pay | Admitting: *Deleted

## 2021-05-30 DIAGNOSIS — C50911 Malignant neoplasm of unspecified site of right female breast: Secondary | ICD-10-CM

## 2021-05-30 MED ORDER — ONDANSETRON HCL 8 MG PO TABS: 8.0000 mg | ORAL_TABLET | Freq: Two times a day (BID) | ORAL | 1 refills | Status: DC | PRN

## 2021-05-30 MED ORDER — LIDOCAINE-PRILOCAINE 2.5-2.5 % EX CREA: TOPICAL_CREAM | CUTANEOUS | 3 refills | Status: DC

## 2021-05-30 MED ORDER — PREDNISONE 20 MG PO TABS: 100.0000 mg | ORAL_TABLET | Freq: Every day | ORAL | 5 refills | Status: DC

## 2021-05-30 MED ORDER — LORAZEPAM 0.5 MG PO TABS: 0.5000 mg | ORAL_TABLET | Freq: Four times a day (QID) | ORAL | 0 refills | Status: DC | PRN

## 2021-05-30 MED ORDER — PROCHLORPERAZINE MALEATE 10 MG PO TABS: 10.0000 mg | ORAL_TABLET | Freq: Four times a day (QID) | ORAL | 6 refills | Status: DC | PRN

## 2021-05-30 NOTE — Progress Notes (Signed)
ON PATHWAY REGIMEN - Lymphoma and CLL ? ?No Change  Continue With Treatment as Ordered. ? ?Original Decision Date/Time: 05/29/2021 23:10 ? ? ?  A cycle is every 21 days: ?    Prednisone  ?    Rituximab-xxxx  ?    Cyclophosphamide  ?    Doxorubicin  ?    Vincristine  ? ?**Always confirm dose/schedule in your pharmacy ordering system** ? ?Patient Characteristics: ?Diffuse Large B-Cell Lymphoma or Follicular Lymphoma, Grade 3B, First Line, Stage III and IV ?Disease Type: Not Applicable ?Disease Type: Diffuse Large B-Cell Lymphoma ?Disease Type: Not Applicable ?Line of therapy: First Line ?Intent of Therapy: ?Curative Intent, Discussed with Patient ?

## 2021-05-31 ENCOUNTER — Other Ambulatory Visit: Payer: Self-pay

## 2021-05-31 ENCOUNTER — Inpatient Hospital Stay: Payer: BC Managed Care – PPO

## 2021-06-01 ENCOUNTER — Encounter (HOSPITAL_COMMUNITY): Payer: Self-pay | Admitting: Hematology and Oncology

## 2021-06-01 ENCOUNTER — Inpatient Hospital Stay (HOSPITAL_COMMUNITY)
Admission: RE | Admit: 2021-06-01 | Discharge: 2021-06-05 | DRG: 825 | Disposition: A | Discharge status: Home / Self Care | Payer: BC Managed Care – PPO | Source: Ambulatory Visit | Attending: Hematology and Oncology | Admitting: Hematology and Oncology

## 2021-06-01 ENCOUNTER — Other Ambulatory Visit: Payer: Self-pay

## 2021-06-01 ENCOUNTER — Inpatient Hospital Stay: Payer: Self-pay

## 2021-06-01 ENCOUNTER — Inpatient Hospital Stay (HOSPITAL_COMMUNITY): Payer: BC Managed Care – PPO

## 2021-06-01 DIAGNOSIS — Z8371 Family history of colonic polyps: Secondary | ICD-10-CM | POA: Diagnosis not present

## 2021-06-01 DIAGNOSIS — Z66 Do not resuscitate: Secondary | ICD-10-CM | POA: Diagnosis present

## 2021-06-01 DIAGNOSIS — Z880 Allergy status to penicillin: Secondary | ICD-10-CM | POA: Diagnosis not present

## 2021-06-01 DIAGNOSIS — Z8042 Family history of malignant neoplasm of prostate: Secondary | ICD-10-CM | POA: Diagnosis not present

## 2021-06-01 DIAGNOSIS — R569 Unspecified convulsions: Secondary | ICD-10-CM | POA: Diagnosis present

## 2021-06-01 DIAGNOSIS — Z0189 Encounter for other specified special examinations: Secondary | ICD-10-CM | POA: Diagnosis not present

## 2021-06-01 DIAGNOSIS — C50911 Malignant neoplasm of unspecified site of right female breast: Secondary | ICD-10-CM

## 2021-06-01 DIAGNOSIS — Z9221 Personal history of antineoplastic chemotherapy: Secondary | ICD-10-CM | POA: Diagnosis not present

## 2021-06-01 DIAGNOSIS — Z5111 Encounter for antineoplastic chemotherapy: Secondary | ICD-10-CM

## 2021-06-01 DIAGNOSIS — Z823 Family history of stroke: Secondary | ICD-10-CM | POA: Diagnosis not present

## 2021-06-01 DIAGNOSIS — D6959 Other secondary thrombocytopenia: Secondary | ICD-10-CM | POA: Diagnosis present

## 2021-06-01 DIAGNOSIS — R6889 Other general symptoms and signs: Secondary | ICD-10-CM | POA: Diagnosis not present

## 2021-06-01 DIAGNOSIS — C8331 Diffuse large B-cell lymphoma, lymph nodes of head, face, and neck: Principal | ICD-10-CM | POA: Diagnosis present

## 2021-06-01 DIAGNOSIS — Z923 Personal history of irradiation: Secondary | ICD-10-CM | POA: Diagnosis not present

## 2021-06-01 DIAGNOSIS — Z8249 Family history of ischemic heart disease and other diseases of the circulatory system: Secondary | ICD-10-CM

## 2021-06-01 DIAGNOSIS — R590 Localized enlarged lymph nodes: Secondary | ICD-10-CM

## 2021-06-01 DIAGNOSIS — R58 Hemorrhage, not elsewhere classified: Secondary | ICD-10-CM | POA: Diagnosis not present

## 2021-06-01 DIAGNOSIS — Z803 Family history of malignant neoplasm of breast: Secondary | ICD-10-CM | POA: Diagnosis not present

## 2021-06-01 DIAGNOSIS — Z853 Personal history of malignant neoplasm of breast: Secondary | ICD-10-CM | POA: Diagnosis not present

## 2021-06-01 DIAGNOSIS — Z8719 Personal history of other diseases of the digestive system: Secondary | ICD-10-CM

## 2021-06-01 DIAGNOSIS — D630 Anemia in neoplastic disease: Secondary | ICD-10-CM | POA: Diagnosis present

## 2021-06-01 DIAGNOSIS — I10 Essential (primary) hypertension: Secondary | ICD-10-CM | POA: Diagnosis present

## 2021-06-01 DIAGNOSIS — C8590 Non-Hodgkin lymphoma, unspecified, unspecified site: Secondary | ICD-10-CM | POA: Diagnosis present

## 2021-06-01 DIAGNOSIS — C8100 Nodular lymphocyte predominant Hodgkin lymphoma, unspecified site: Principal | ICD-10-CM

## 2021-06-01 DIAGNOSIS — Z79899 Other long term (current) drug therapy: Secondary | ICD-10-CM | POA: Diagnosis not present

## 2021-06-01 DIAGNOSIS — Z9011 Acquired absence of right breast and nipple: Secondary | ICD-10-CM

## 2021-06-01 LAB — CBC WITH DIFFERENTIAL/PLATELET
Abs Immature Granulocytes: 0.02 10*3/uL (ref 0.00–0.07)
Basophils Absolute: 0 10*3/uL (ref 0.0–0.1)
Basophils Relative: 1 %
Eosinophils Absolute: 0.1 10*3/uL (ref 0.0–0.5)
Eosinophils Relative: 2 %
HCT: 25.3 % — ABNORMAL LOW (ref 36.0–46.0)
Hemoglobin: 8.2 g/dL — ABNORMAL LOW (ref 12.0–15.0)
Immature Granulocytes: 1 %
Lymphocytes Relative: 40 %
Lymphs Abs: 1.7 10*3/uL (ref 0.7–4.0)
MCH: 30.8 pg (ref 26.0–34.0)
MCHC: 32.4 g/dL (ref 30.0–36.0)
MCV: 95.1 fL (ref 80.0–100.0)
Monocytes Absolute: 0.7 10*3/uL (ref 0.1–1.0)
Monocytes Relative: 16 %
Neutro Abs: 1.6 10*3/uL — ABNORMAL LOW (ref 1.7–7.7)
Neutrophils Relative %: 40 %
Platelets: 132 10*3/uL — ABNORMAL LOW (ref 150–400)
RBC: 2.66 MIL/uL — ABNORMAL LOW (ref 3.87–5.11)
RDW: 17.6 % — ABNORMAL HIGH (ref 11.5–15.5)
WBC: 4.1 10*3/uL (ref 4.0–10.5)
nRBC: 0 % (ref 0.0–0.2)

## 2021-06-01 LAB — COMPREHENSIVE METABOLIC PANEL
ALT: 15 U/L (ref 0–44)
AST: 18 U/L (ref 15–41)
Albumin: 3.2 g/dL — ABNORMAL LOW (ref 3.5–5.0)
Alkaline Phosphatase: 74 U/L (ref 38–126)
Anion gap: 9 (ref 5–15)
BUN: 16 mg/dL (ref 8–23)
CO2: 24 mmol/L (ref 22–32)
Calcium: 8.6 mg/dL — ABNORMAL LOW (ref 8.9–10.3)
Chloride: 102 mmol/L (ref 98–111)
Creatinine, Ser: 0.86 mg/dL (ref 0.44–1.00)
GFR, Estimated: >60 mL/min (ref >60)
Glucose, Bld: 115 mg/dL — ABNORMAL HIGH (ref 70–99)
Potassium: 3.5 mmol/L (ref 3.5–5.1)
Sodium: 135 mmol/L (ref 135–145)
Total Bilirubin: 1 mg/dL (ref 0.3–1.2)
Total Protein: 6.3 g/dL — ABNORMAL LOW (ref 6.5–8.1)

## 2021-06-01 LAB — URIC ACID: Uric Acid, Serum: 4.6 mg/dL (ref 2.5–7.1)

## 2021-06-01 LAB — ECHOCARDIOGRAM COMPLETE
AR max vel: 2.39 cm2
AV Area VTI: 2.69 cm2
AV Area mean vel: 2.38 cm2
AV Mean grad: 7 mmHg
AV Peak grad: 11.7 mmHg
Ao pk vel: 1.71 m/s
Area-P 1/2: 7.09 cm2
Calc EF: 59.9 %
S' Lateral: 3.4 cm
Single Plane A2C EF: 60.7 %
Single Plane A4C EF: 56.5 %

## 2021-06-01 LAB — PHOSPHORUS: Phosphorus: 4.1 mg/dL (ref 2.5–4.6)

## 2021-06-01 LAB — LACTATE DEHYDROGENASE: LDH: 214 U/L — ABNORMAL HIGH (ref 98–192)

## 2021-06-01 MED ORDER — POTASSIUM 99 MG PO TABS: 99.0000 mg | ORAL_TABLET | Freq: Every day | ORAL | Status: DC

## 2021-06-01 MED ORDER — ALLOPURINOL 300 MG PO TABS
300.0000 mg | ORAL_TABLET | Freq: Every day | ORAL | Status: DC
  Administered 2021-06-02 – 2021-06-04 (×3): 300 mg via ORAL
  Filled 2021-06-01 (×3): qty 1

## 2021-06-01 MED ORDER — SODIUM CHLORIDE 0.9 % IV SOLN: INTRAVENOUS | Status: DC

## 2021-06-01 MED ORDER — ENOXAPARIN SODIUM 40 MG/0.4ML IJ SOSY
40.0000 mg | PREFILLED_SYRINGE | INTRAMUSCULAR | Status: DC
  Administered 2021-06-01: 40 mg via SUBCUTANEOUS
  Filled 2021-06-01: qty 0.4

## 2021-06-01 MED ORDER — VANCOMYCIN HCL IN DEXTROSE 1-5 GM/200ML-% IV SOLN: 1000.0000 mg | INTRAVENOUS | Status: DC

## 2021-06-01 MED ORDER — FISH OIL ULTRA 1400 MG PO CAPS: 1400.0000 mg | ORAL_CAPSULE | ORAL | Status: DC

## 2021-06-01 MED ORDER — CELECOXIB 200 MG PO CAPS
200.0000 mg | ORAL_CAPSULE | Freq: Every day | ORAL | Status: DC
  Administered 2021-06-03 – 2021-06-05 (×3): 200 mg via ORAL
  Filled 2021-06-01 (×4): qty 1

## 2021-06-01 MED ORDER — MAGNESIUM OXIDE -MG SUPPLEMENT 400 (240 MG) MG PO TABS
200.0000 mg | ORAL_TABLET | Freq: Every day | ORAL | Status: DC
  Administered 2021-06-03 – 2021-06-05 (×3): 200 mg via ORAL
  Filled 2021-06-01 (×4): qty 1

## 2021-06-01 MED ORDER — TRAMADOL HCL 50 MG PO TABS: 50.0000 mg | ORAL_TABLET | Freq: Four times a day (QID) | ORAL | Status: DC | PRN

## 2021-06-01 MED ORDER — VITAMIN D3 25 MCG (1000 UNIT) PO TABS
4000.0000 [IU] | ORAL_TABLET | Freq: Every day | ORAL | Status: DC
  Administered 2021-06-03 – 2021-06-05 (×3): 4000 [IU] via ORAL
  Filled 2021-06-01 (×4): qty 4

## 2021-06-01 MED ORDER — ALUM & MAG HYDROXIDE-SIMETH 200-200-20 MG/5ML PO SUSP: 60.0000 mL | ORAL | Status: DC | PRN

## 2021-06-01 MED ORDER — ACETAMINOPHEN 500 MG PO TABS: 500.0000 mg | ORAL_TABLET | Freq: Four times a day (QID) | ORAL | Status: DC | PRN

## 2021-06-01 MED ORDER — MAGNESIUM 250 MG PO TABS: 250.0000 mg | ORAL_TABLET | Freq: Every day | ORAL | Status: DC

## 2021-06-01 MED ORDER — OMEGA-3-ACID ETHYL ESTERS 1 G PO CAPS
2.0000 g | ORAL_CAPSULE | ORAL | Status: DC
  Administered 2021-06-05: 2 g via ORAL
  Filled 2021-06-01 (×2): qty 2

## 2021-06-01 MED ORDER — LORAZEPAM 0.5 MG PO TABS: 0.5000 mg | ORAL_TABLET | Freq: Four times a day (QID) | ORAL | Status: DC | PRN

## 2021-06-01 MED ORDER — DOCUSATE SODIUM 100 MG PO CAPS: 100.0000 mg | ORAL_CAPSULE | Freq: Every day | ORAL | Status: DC | PRN

## 2021-06-01 MED ORDER — TRIAMTERENE-HCTZ 37.5-25 MG PO TABS
1.0000 | ORAL_TABLET | Freq: Every day | ORAL | Status: DC
  Administered 2021-06-03 – 2021-06-05 (×3): 1 via ORAL
  Filled 2021-06-01 (×4): qty 1

## 2021-06-01 MED ORDER — ONDANSETRON HCL 8 MG PO TABS: 8.0000 mg | ORAL_TABLET | Freq: Two times a day (BID) | ORAL | Status: DC | PRN

## 2021-06-01 MED ORDER — PROCHLORPERAZINE MALEATE 10 MG PO TABS: 10.0000 mg | ORAL_TABLET | Freq: Four times a day (QID) | ORAL | Status: DC | PRN

## 2021-06-01 MED ORDER — ENOXAPARIN SODIUM 40 MG/0.4ML IJ SOSY: 40.0000 mg | PREFILLED_SYRINGE | INTRAMUSCULAR | Status: DC

## 2021-06-01 NOTE — Progress Notes (Signed)
?  Echocardiogram ?2D Echocardiogram has been performed. ? ?Barbara Rhodes  Barbara Rhodes ?06/01/2021, 3:36 PM ?

## 2021-06-01 NOTE — Consult Note (Signed)
?[facility] ?Consult Note ? ?Monica Becton ?1955/02/15  ?[id number].   ? ?Requesting MD: Benay Pike ?Chief Complaint/Reason for Consult: lymph node biopsy ? ?HPI:  ?Barbara Rhodes is a 66yo female PMH HTN and h/o breast cancer in 2000 s/p right mastectomy, chemo and radiation in [place], who is being admitted by Dr. Chryl Heck for initiation of chemotherapy for Non-Hodgkins lymphoma. Patient reports noticing an enlarging lymph node in her left neck earlier this year. She underwent u/s guided biopsy by IR 04/03/21 which revealed lymphoid proliferation for involvement by a B-cell lymphoproliferative process particularly large cell lymphoma. PET with diffuse lymphadenopathy working above and below diaphragm with marrow infiltration concerning for aggressive lymphoma. Unfortunately core needle biopsy was not enough tissue and they need an excisional biopsy for better characterization.  ?General surgery asked to see. ? ?Anticoagulants: none ?Nonsmoker ?Denies alcohol or illicit drug use ? ?Review of Systems  ?Constitutional:  Positive for malaise/fatigue.  ?     Night sweats  ?Musculoskeletal:  Positive for neck pain.  ? ?All systems reviewed and otherwise negative except for as above ? ?Family History  ?Problem Relation Age of Onset  ? Breast cancer Paternal Grandmother 18  ? Prostate cancer Father   ?     dx in his 75s  ? Colon polyps Father 34  ? Dementia Mother   ? Hypertension Mother   ? Dementia Maternal Aunt   ? Stroke Maternal Grandfather   ? Rheum arthritis Paternal Grandfather   ? Breast cancer Other   ?     MGM's sister, post-menopausal breast cancer  ? Breast cancer Cousin 36  ?     paternal 2nd cousin  ? ? ?Past Medical History:  ?Diagnosis Date  ? Arthritis   ? Breast cancer ([place]) 2000  ? Stage 2B, ER+/PR-/Her2-  ? Colon polyps   ? Family history of adverse reaction to anesthesia 1959  ? Brother passed away at 75 years old from malignant hyperthermia.  ? Family history of  breast cancer   ? Family history of prostate cancer   ? Hypertension   ? ? ?Past Surgical History:  ?Procedure Laterality Date  ? ABDOMINAL HYSTERECTOMY    ? BACK SURGERY    ? BREAST SURGERY    ? COLONOSCOPY WITH PROPOFOL    ? pt said propofol burned her throat when pushed  ? IR US GUIDE BX ASP/DRAIN  04/03/2021  ? MASTECTOMY Right 2000  ? MASTECTOMY Right 2000  ? POSTERIOR CERVICAL LAMINECTOMY Left 01/23/2018  ? Procedure: LEFT CERVICAL SEVEN- THORACIC ONE LAMINOTOMY,FORAMINOTOMY, MICRODISCECTOMY;  Surgeon: Jovita Gamma, MD;  Location: [facility];  Service: Neurosurgery;  Laterality: Left;  ? ROTATOR CUFF REPAIR    ? TONSILLECTOMY    ? TRANSFORAMINAL LUMBAR INTERBODY FUSION (TLIF) WITH PEDICLE SCREW FIXATION 2 LEVEL Right 10/30/2019  ? Procedure: Right Lumbar 3-4 Lumbar 4-5 Transforaminal lumbar interbody fusion;  Surgeon: Erline Levine, MD;  Location: [facility];  Service: Neurosurgery;  Laterality: Right;  3C/RM 21  ? TUBAL LIGATION    ? WISDOM TOOTH EXTRACTION    ? ? ?Social History:  reports that she has never smoked. She has never used smokeless tobacco. She reports that she does not drink alcohol and does not use drugs. ? ?Allergies:  ?Allergies  ?Allergen Reactions  ? Penicillins Anaphylaxis  ?  DID THE REACTION INVOLVE: Swelling of the face/tongue/throat, SOB, or low BP? Yes ?Sudden or severe rash/hives, skin peeling, or the inside of the mouth or nose? No ?Did  it require medical treatment? Yes ?When did it last happen?      63 years ago ?If all above answers are ?NO?, may proceed with cephalosporin use. ?  ? ? ?Medications Prior to Admission  ?Medication Sig Dispense Refill  ? acetaminophen (TYLENOL) 500 MG tablet Take 500-1,000 mg by mouth every 6 (six) hours as needed (pain.).    ? allopurinol (ZYLOPRIM) 300 MG tablet Take 1 tablet (300 mg total) by mouth daily. 30 tablet 1  ? Calcium Citrate-Vitamin D (CALCIUM CITRATE +D PO) Take 1 tablet by mouth daily.    ? celecoxib (CELEBREX) 200 MG capsule Restart in 5 days  (Patient taking differently: Take 200 mg by mouth daily.) 1 capsule 0  ? Cholecalciferol (VITAMIN D3) 125 MCG (5000 UT) TABS Take 5,000 Units by mouth daily.    ? lidocaine-prilocaine (EMLA) cream Apply to affected area once 30 g 3  ? LORazepam (ATIVAN) 0.5 MG tablet Take 1 tablet (0.5 mg total) by mouth every 6 (six) hours as needed (Nausea or vomiting). 30 tablet 0  ? LUTEIN PO Take 1 tablet by mouth daily.    ? MAGNESIUM PO Take 1 tablet by mouth daily.    ? Multiple Vitamin (MULTIVITAMIN WITH MINERALS) TABS tablet Take 1 tablet by mouth daily.    ? naphazoline-pheniramine (ALLERGY EYE) 0.025-0.3 % ophthalmic solution Place 1 drop into both eyes 4 (four) times daily as needed for allergies.    ? Omega-3 Fatty Acids (FISH OIL ULTRA) 1400 MG CAPS Take 1,400 mg by mouth daily.    ? ondansetron (ZOFRAN) 8 MG tablet Take 1 tablet (8 mg total) by mouth 2 (two) times daily as needed for refractory nausea / vomiting. Start on day 3 after cyclophosphamide chemotherapy. 30 tablet 1  ? OVER THE COUNTER MEDICATION Take 400 mg by mouth daily. Andrographis Supplement    ? Potassium 99 MG TABS Take 99 mg by mouth daily.    ? predniSONE (DELTASONE) 20 MG tablet Take 5 tablets (100 mg total) by mouth daily. Take with food on days 1-5 of chemotherapy. 25 tablet 5  ? Probiotic Product (DAILY DIGESTIVE PROBIOTIC) CAPS Take 1 capsule by mouth 3 (three) times a week.    ? prochlorperazine (COMPAZINE) 10 MG tablet Take 1 tablet (10 mg total) by mouth every 6 (six) hours as needed (Nausea or vomiting). 30 tablet 6  ? traMADol (ULTRAM) 50 MG tablet Take 50 mg by mouth every 6 (six) hours as needed for moderate pain.    ? triamterene-hydrochlorothiazide (MAXZIDE-25) 37.5-25 MG tablet Take 1 tablet by mouth daily.     ? ? ?Prior to Admission medications   ?Medication Sig Start Date End Date Taking? Authorizing Provider  ?acetaminophen (TYLENOL) 500 MG tablet Take 500-1,000 mg by mouth every 6 (six) hours as needed (pain.).    [provider]  ?allopurinol (ZYLOPRIM) 300 MG tablet Take 1 tablet (300 mg total) by mouth daily. 05/26/21   Benay Pike, MD  ?Calcium Citrate-Vitamin D (CALCIUM CITRATE +D PO) Take 1 tablet by mouth daily.    [provider]  ?celecoxib (CELEBREX) 200 MG capsule Restart in 5 days ?Patient taking differently: Take 200 mg by mouth daily. 10/31/19   Osie Cheeks, NP  ?Cholecalciferol (VITAMIN D3) 125 MCG (5000 UT) TABS Take 5,000 Units by mouth daily.    [provider]  ?lidocaine-prilocaine (EMLA) cream Apply to affected area once 05/30/21   Benay Pike, MD  ?LORazepam (ATIVAN) 0.5 MG tablet Take 1 tablet (0.5  mg total) by mouth every 6 (six) hours as needed (Nausea or vomiting). 05/30/21   Benay Pike, MD  ?LUTEIN PO Take 1 tablet by mouth daily.    [provider]  ?MAGNESIUM PO Take 1 tablet by mouth daily.    [provider]  ?Multiple Vitamin (MULTIVITAMIN WITH MINERALS) TABS tablet Take 1 tablet by mouth daily.    [provider]  ?naphazoline-pheniramine (ALLERGY EYE) 0.025-0.3 % ophthalmic solution Place 1 drop into both eyes 4 (four) times daily as needed for allergies.    [provider]  ?Omega-3 Fatty Acids (FISH OIL ULTRA) 1400 MG CAPS Take 1,400 mg by mouth daily.    [provider]  ?ondansetron (ZOFRAN) 8 MG tablet Take 1 tablet (8 mg total) by mouth 2 (two) times daily as needed for refractory nausea / vomiting. Start on day 3 after cyclophosphamide chemotherapy. 05/30/21   Benay Pike, MD  ?OVER THE COUNTER MEDICATION Take 400 mg by mouth daily. Andrographis Supplement    [provider]  ?Potassium 99 MG TABS Take 99 mg by mouth daily.    [provider]  ?predniSONE (DELTASONE) 20 MG tablet Take 5 tablets (100 mg total) by mouth daily. Take with food on days 1-5 of chemotherapy. 05/30/21   Benay Pike, MD  ?Probiotic Product (DAILY DIGESTIVE PROBIOTIC) CAPS Take 1 capsule by mouth 3 (three) times a week.     [provider]  ?prochlorperazine (COMPAZINE) 10 MG tablet Take 1 tablet (10 mg total) by mouth every 6 (six) hours as needed (Nausea or vomiting). 05/30/21   Benay Pike, MD  ?traMADol Veatrice Bourbon) 50

## 2021-06-01 NOTE — H&P (Addendum)
Barbara Rhodes  [phone] [phone]   MEDICAL ONCOLOGY - ADMISSION HISTORY & PHYSICAL  Reason for admission: Large B-cell lymphoma  HPI: Ms. Barbara Rhodes is a 66 year old female with a past medical history significant for hypertension and history of breast cancer in 2000 status post right mastectomy, chemotherapy, and radiation in [place].  She is being admitted for initiation of chemotherapy for non-Hodgkin's lymphoma.  She developed an enlarging lymph node in her left neck earlier this year.  She underwent an ultrasound-guided biopsy on 04/03/2021 which revealed lymphoid proliferation for involvement by B-cell lymphoproliferative process particularly large B-cell lymphoma.  PET scan showed diffuse lymphadenopathy above and below the diaphragm with marrow infiltration concerning for aggressive lymphoma.  Excisional lymph node biopsy pending for better characterization of her lymphoma.  She was seen in our office last week to discuss treatment options.  She initially did not want to take chemotherapy but after thinking about this further, opted to proceed with chemo.  Decision was made to admit her today due to high risk for tumor lysis syndrome and initiate systemic treatment with R-CHOP without doxorubicin due to having received lifetime maximum dose previously.  Today, she has no specific complaints.  She is not having any fevers, chills, chest pain, shortness of breath, abdominal pain, nausea, vomiting.  She had some constipation which has now resolved.  She uses a stool softener.  No swelling or bleeding noted.  The patient is being admitted to [facility] to begin cycle #1 of R-CHOP.  Past Medical History:  Diagnosis Date   Arthritis    Breast cancer (HCC) 2000   Stage 2B, ER+/PR-/Her2-   Colon polyps    Family history of adverse reaction to anesthesia 54   Brother passed away at 32 years old from malignant hyperthermia.   Family history of breast  cancer    Family history of prostate cancer    Hypertension   :   Past Surgical History:  Procedure Laterality Date   ABDOMINAL HYSTERECTOMY     BACK SURGERY     BREAST SURGERY     COLONOSCOPY WITH PROPOFOL     pt said propofol burned her throat when pushed   IR US GUIDE BX ASP/DRAIN  04/03/2021   MASTECTOMY Right 2000   MASTECTOMY Right 2000   POSTERIOR CERVICAL LAMINECTOMY Left 01/23/2018   Procedure: LEFT CERVICAL SEVEN- THORACIC ONE LAMINOTOMY,FORAMINOTOMY, MICRODISCECTOMY;  Surgeon: Shirlean Kelly, MD;  Location: [facility] OR;  Service: Neurosurgery;  Laterality: Left;   ROTATOR CUFF REPAIR     TONSILLECTOMY     TRANSFORAMINAL LUMBAR INTERBODY FUSION (TLIF) WITH PEDICLE SCREW FIXATION 2 LEVEL Right 10/30/2019   Procedure: Right Lumbar 3-4 Lumbar 4-5 Transforaminal lumbar interbody fusion;  Surgeon: Maeola Harman, MD;  Location: [facility] OR;  Service: Neurosurgery;  Laterality: Right;  3C/RM 21   TUBAL LIGATION     WISDOM TOOTH EXTRACTION    :   Current Facility-Administered Medications  Medication Dose Route Frequency Provider Last Rate Last Admin   0.9 %  sodium chloride infusion   Intravenous Continuous Curcio, Reita May, NP       acetaminophen (TYLENOL) tablet 500-1,000 mg  500-1,000 mg Oral Q6H PRN Myrtis Ser, NP       [START ON 06/02/2021] allopurinol (ZYLOPRIM) tablet 300 mg  300 mg Oral QHS Curcio, Kristin R, NP       alum & mag hydroxide-simeth (MAALOX/MYLANTA) 200-200-20 MG/5ML suspension 60 mL  60 mL Oral Q4H PRN Myrtis Ser,  NP       celecoxib (CELEBREX) capsule 200 mg  200 mg Oral Daily Curcio, Kristin R, NP       enoxaparin (LOVENOX) injection 40 mg  40 mg Subcutaneous Q24H Curcio, Kristin R, NP       LORazepam (ATIVAN) tablet 0.5 mg  0.5 mg Oral Q6H PRN Myrtis Ser, NP       ondansetron (ZOFRAN) tablet 8 mg  8 mg Oral BID PRN Myrtis Ser, NP       prochlorperazine (COMPAZINE) tablet 10 mg  10 mg Oral Q6H PRN Curcio, Reita May, NP       traMADol  (ULTRAM) tablet 50 mg  50 mg Oral Q6H PRN Curcio, Reita May, NP       triamterene-hydrochlorothiazide (MAXZIDE-25) 37.5-25 MG per tablet 1 tablet  1 tablet Oral Daily Myrtis Ser, NP       [START ON 06/02/2021] vancomycin (VANCOCIN) IVPB 1000 mg/200 mL premix  1,000 mg Intravenous On Call to OR Meuth, Brooke A, PA-C          Allergies  Allergen Reactions   Penicillins Anaphylaxis    DID THE REACTION INVOLVE: Swelling of the face/tongue/throat, SOB, or low BP? Yes Sudden or severe rash/hives, skin peeling, or the inside of the mouth or nose? No Did it require medical treatment? Yes When did it last happen?      63 years ago If all above answers are "NO", may proceed with cephalosporin use.   :   Family History  Problem Relation Age of Onset   Breast cancer Paternal Grandmother 51   Prostate cancer Father        dx in his 50s   Colon polyps Father 54   Dementia Mother    Hypertension Mother    Dementia Maternal Aunt    Stroke Maternal Grandfather    Rheum arthritis Paternal Grandfather    Breast cancer Other        MGM's sister, post-menopausal breast cancer   Breast cancer Cousin 60       paternal 2nd cousin  :   Social History   Socioeconomic History   Marital status: Married    Spouse name: Not on file   Number of children: Not on file   Years of education: Not on file   Highest education level: Not on file  Occupational History   Not on file  Tobacco Use   Smoking status: Never   Smokeless tobacco: Never  Vaping Use   Vaping Use: Never used  Substance and Sexual Activity   Alcohol use: No   Drug use: No   Sexual activity: Not on file  Other Topics Concern   Not on file  Social History Narrative   Not on file   Social Determinants of Health   Financial Resource Strain: Not on file  Food Insecurity: Not on file  Transportation Needs: Not on file  Physical Activity: Not on file  Stress: Not on file  Social Connections: Not on file  Intimate  Partner Violence: Not on file  :  Review of Systems: A comprehensive 14 point review of systems was negative except as noted in the HPI.  Exam: Patient Vitals for the past 24 hrs:  BP Temp Temp src Pulse Resp SpO2  06/01/21 1229 136/68 97.7 F (36.5 C) Oral (!) 103 18 99 %    General:  well-nourished in no acute distress.   Eyes:  no scleral icterus.   ENT:  There  were no oropharyngeal lesions.   Lymphatics: Palpable lymphadenopathy in the left neck and supraclavicular area Respiratory: lungs were clear bilaterally without wheezing or crackles.   Cardiovascular:  Regular rate and rhythm, S1/S2, without murmur, rub or gallop.  There was no pedal edema.   GI:  abdomen was soft, flat, nontender, nondistended, without organomegaly.   Skin exam was without echymosis, petichae.   Neuro exam was nonfocal. Patient was alert and oriented.  Attention was good.   Language was appropriate.  Mood was normal without depression.  Speech was not pressured.  Thought content was not tangential.     Lab Results  Component Value Date   WBC 4.4 05/25/2021   HGB 9.2 (L) 05/25/2021   HCT 27.7 (L) 05/25/2021   PLT 127 (L) 05/25/2021   GLUCOSE 125 (H) 05/25/2021   ALT 14 05/25/2021   AST 16 05/25/2021   NA 137 05/25/2021   K 3.4 (L) 05/25/2021   CL 99 05/25/2021   CREATININE 1.01 (H) 05/25/2021   BUN 16 05/25/2021   CO2 29 05/25/2021    NM PET Image Initial (PI) Skull Base To Thigh (F-18 FDG)  Result Date: 05/10/2021 CLINICAL DATA:  Initial treatment strategy for lymphoma. EXAM: NUCLEAR MEDICINE PET SKULL BASE TO THIGH TECHNIQUE: 10.4 mCi F-18 FDG was injected intravenously. Full-ring PET imaging was performed from the skull base to thigh after the radiotracer. CT data was obtained and used for attenuation correction and anatomic localization. Fasting blood glucose: 125 mg/dl COMPARISON:  Comparison made with CT of the neck of March 10, 2021. FINDINGS: Mediastinal blood pool activity: SUV max  3.10 Liver activity: SUV max 4.81 NECK: Bulky adenopathy in the LEFT neck tracking through the thoracic inlet. (Image 42/4) adenopathy is nearly confluent with the largest individual lymph node measuring 25 mm short axis previously approximately 14 mm. (Image 34/4) 20 mm lymph node in the LEFT supraclavicular region/low neck along the posterior margin of the sternocleidomastoid was previously approximately 13 mm. Enlarged lymph nodes extend along the length of the LEFT clavicle with a maximum SUV of 23.9 in this vicinity. Lymph nodes extend to the level of the hyoid in the LEFT neck and into the superior mediastinum along the LEFT neck involving posterior supraclavicular and low cervical triangle lymph nodes as well. Incidental CT findings: None CHEST: Enlarging mediastinal and axillary lymph nodes. (Image 58/4) 2 cm anterior mediastinal/prevascular lymph node with a maximum SUV of 2.6, previously less than a cm. Adjacent AP window lymph node with similar SUV values measures 16 mm short axis (image 61/4) (Image 65/4) maximum SUV of a subcarinal lymph node is 21, lymph node measuring 12 mm LEFT infrahilar/juxta aortic adenopathy largest measuring 20 mm short axis showing similar SUV values with lymph nodes tracking along the LEFT and RIGHT of the aorta into the retrocrural region and into the abdomen. No suspicious pulmonary nodule. Incidental CT findings: No consolidation or pleural effusion. Aortic atherosclerosis. No aneurysm. No pericardial fluid. Limited assessment of cardiovascular structures given lack of intravenous contrast. Signs of RIGHT mastectomy and axillary dissection without increased metabolic activity in the RIGHT axilla. ABDOMEN/PELVIS: Bulky upper abdominal and retroperitoneal adenopathy. Celiac lymph node (image 101/4) 25 mm with a maximum SUV of 22.63 Similarly FDG avid lymph nodes tracking in the intra-aortocaval groove and along the LEFT periaortic chain largest as much as 3 cm short axis  (image 123/4) slightly smaller lymph nodes in the LEFT periaortic chain, lymph nodes extending to the level of the iliac bifurcation. Paste  that No solid organ involvement.  No pelvic adenopathy. Incidental CT findings: No acute findings relative to liver, gallbladder, pancreas, adrenal glands or kidneys. Spleen without increased metabolic activity beyond metabolic activity in the liver. Splenic size 11 cm. Urinary bladder is collapsed.  No acute gastrointestinal process. SKELETON: Diffuse marrow uptake of FDG involving the marrow spaces with the maximum SUV for example at the T12 level of 6.7 T8 level displaying a maximum SUV of 6.2. Bilateral iliac crest with similar increased metabolic activity relatively homogeneous marrow space activity throughout the spine and pelvis, also seen in the bilateral proximal femora and to a lesser extent in the proximal humeri. Incidental CT findings: Signs of spinal fusion spanning levels L3 through L5 with interbody cage placement. Degenerative changes throughout the spine. No focal osseous abnormality on CT. IMPRESSION: Findings of adenopathy in the neck, chest and in the abdomen in this patient with reported history of lymphoma, categorized as Deauville criteria 5 disease. No signs of splenomegaly, top-normal size spleen with splenic activity very close to hepatic activity but not exceeding hepatic activity, attention on follow-up. Diffuse marrow uptake of FDG which in the absence of marrow stimulation is suspicious for marrow space involvement. Signs of RIGHT mastectomy and axillary dissection in this patient with history of breast cancer. Cholelithiasis. Electronically Signed   By: Donzetta Kohut M.D.   On: 05/10/2021 14:43   [place] EKG SITE RITE  Result Date: 06/01/2021 If Site Rite image not attached, placement could not be confirmed due to current cardiac rhythm.    NM PET Image Initial (PI) Skull Base To Thigh (F-18 FDG)  Result Date: 05/10/2021 CLINICAL DATA:   Initial treatment strategy for lymphoma. EXAM: NUCLEAR MEDICINE PET SKULL BASE TO THIGH TECHNIQUE: 10.4 mCi F-18 FDG was injected intravenously. Full-ring PET imaging was performed from the skull base to thigh after the radiotracer. CT data was obtained and used for attenuation correction and anatomic localization. Fasting blood glucose: 125 mg/dl COMPARISON:  Comparison made with CT of the neck of March 10, 2021. FINDINGS: Mediastinal blood pool activity: SUV max 3.10 Liver activity: SUV max 4.81 NECK: Bulky adenopathy in the LEFT neck tracking through the thoracic inlet. (Image 42/4) adenopathy is nearly confluent with the largest individual lymph node measuring 25 mm short axis previously approximately 14 mm. (Image 34/4) 20 mm lymph node in the LEFT supraclavicular region/low neck along the posterior margin of the sternocleidomastoid was previously approximately 13 mm. Enlarged lymph nodes extend along the length of the LEFT clavicle with a maximum SUV of 23.9 in this vicinity. Lymph nodes extend to the level of the hyoid in the LEFT neck and into the superior mediastinum along the LEFT neck involving posterior supraclavicular and low cervical triangle lymph nodes as well. Incidental CT findings: None CHEST: Enlarging mediastinal and axillary lymph nodes. (Image 58/4) 2 cm anterior mediastinal/prevascular lymph node with a maximum SUV of 2.6, previously less than a cm. Adjacent AP window lymph node with similar SUV values measures 16 mm short axis (image 61/4) (Image 65/4) maximum SUV of a subcarinal lymph node is 21, lymph node measuring 12 mm LEFT infrahilar/juxta aortic adenopathy largest measuring 20 mm short axis showing similar SUV values with lymph nodes tracking along the LEFT and RIGHT of the aorta into the retrocrural region and into the abdomen. No suspicious pulmonary nodule. Incidental CT findings: No consolidation or pleural effusion. Aortic atherosclerosis. No aneurysm. No pericardial fluid.  Limited assessment of cardiovascular structures given lack of intravenous contrast. Signs of  RIGHT mastectomy and axillary dissection without increased metabolic activity in the RIGHT axilla. ABDOMEN/PELVIS: Bulky upper abdominal and retroperitoneal adenopathy. Celiac lymph node (image 101/4) 25 mm with a maximum SUV of 22.63 Similarly FDG avid lymph nodes tracking in the intra-aortocaval groove and along the LEFT periaortic chain largest as much as 3 cm short axis (image 123/4) slightly smaller lymph nodes in the LEFT periaortic chain, lymph nodes extending to the level of the iliac bifurcation. Paste that No solid organ involvement.  No pelvic adenopathy. Incidental CT findings: No acute findings relative to liver, gallbladder, pancreas, adrenal glands or kidneys. Spleen without increased metabolic activity beyond metabolic activity in the liver. Splenic size 11 cm. Urinary bladder is collapsed.  No acute gastrointestinal process. SKELETON: Diffuse marrow uptake of FDG involving the marrow spaces with the maximum SUV for example at the T12 level of 6.7 T8 level displaying a maximum SUV of 6.2. Bilateral iliac crest with similar increased metabolic activity relatively homogeneous marrow space activity throughout the spine and pelvis, also seen in the bilateral proximal femora and to a lesser extent in the proximal humeri. Incidental CT findings: Signs of spinal fusion spanning levels L3 through L5 with interbody cage placement. Degenerative changes throughout the spine. No focal osseous abnormality on CT. IMPRESSION: Findings of adenopathy in the neck, chest and in the abdomen in this patient with reported history of lymphoma, categorized as Deauville criteria 5 disease. No signs of splenomegaly, top-normal size spleen with splenic activity very close to hepatic activity but not exceeding hepatic activity, attention on follow-up. Diffuse marrow uptake of FDG which in the absence of marrow stimulation is suspicious  for marrow space involvement. Signs of RIGHT mastectomy and axillary dissection in this patient with history of breast cancer. Cholelithiasis. Electronically Signed   By: Donzetta Kohut M.D.   On: 05/10/2021 14:43   [place] EKG SITE RITE  Result Date: 06/01/2021 If Site Rite image not attached, placement could not be confirmed due to current cardiac rhythm.   Assessment and Plan:  Lueras is a 66 year old female who is being admitted to the hospital today to initiate systemic chemotherapy with R-CHOP for lymphoma.  She received chemotherapy education in our office earlier this week.  She is at high risk for tumor lysis syndrome and will need close monitoring.  1.  Admit to inpatient oncology 2.  PICC line placement to initiate chemotherapy as soon as possible.  We will try to get Into her today and Rituxan tomorrow depending on timing. 3.  She is high risk for tumor lysis syndrome and will initiate IV fluids with normal saline at 75 cc/h once PICC line is placed. 4.  Obtain baseline lab work including CBC with differential, CMET, LDH, phosphorus, uric acid.  Will monitor tumor lysis syndrome labs closely on a daily basis. 5.  General surgery consult requested for excisional lymph node biopsy to obtain more tissue to better characterize her lymphoma.  We will also request Port-A-Cath placement by general surgery. 6.  Continue home medications including allopurinol and antiemetics. 7.  Will obtain baseline echocardiogram. 8.  Lovenox for DVT prophylaxis (hold per surgery for Port-A-Cath placement and excisional lymph node biopsy). 9.  Regular diet. 10.  The patient is a DNR/DNI.  Clenton Pare, DNP, AGPCNP-BC, AOCNP   Attending Note  I personally saw the patient, reviewed the chart and examined the patient. The plan of care was discussed with the patient and the admitting team. I agree with the assessment and plan as documented  above. Thank you very much for the consultation. Patient seen,  agree with above mentioned plan.  Anticipate port placement tomorrow, hence will omit picc placement today. We will plan chemo tomorrow if we cant get started until 2 PM, hopefully Ritux sat AM.

## 2021-06-01 NOTE — H&P (View-Only) (Signed)
?[facility] ?Consult Note ? ?Monica Becton ?01/19/1956  ?[id number].   ? ?Requesting MD: Benay Pike ?Chief Complaint/Reason for Consult: lymph node biopsy ? ?HPI:  ?Barbara Rhodes is a 66yo female PMH HTN and h/o breast cancer in 2000 s/p right mastectomy, chemo and radiation in [place], who is being admitted by Dr. Chryl Heck for initiation of chemotherapy for Non-Hodgkins lymphoma. Patient reports noticing an enlarging lymph node in her left neck earlier this year. She underwent u/s guided biopsy by IR 04/03/21 which revealed lymphoid proliferation for involvement by a B-cell lymphoproliferative process particularly large cell lymphoma. PET with diffuse lymphadenopathy working above and below diaphragm with marrow infiltration concerning for aggressive lymphoma. Unfortunately core needle biopsy was not enough tissue and they need an excisional biopsy for better characterization.  ?General surgery asked to see. ? ?Anticoagulants: none ?Nonsmoker ?Denies alcohol or illicit drug use ? ?Review of Systems  ?Constitutional:  Positive for malaise/fatigue.  ?     Night sweats  ?Musculoskeletal:  Positive for neck pain.  ? ?All systems reviewed and otherwise negative except for as above ? ?Family History  ?Problem Relation Age of Onset  ? Breast cancer Paternal Grandmother 62  ? Prostate cancer Father   ?     dx in his 66s  ? Colon polyps Father 65  ? Dementia Mother   ? Hypertension Mother   ? Dementia Maternal Aunt   ? Stroke Maternal Grandfather   ? Rheum arthritis Paternal Grandfather   ? Breast cancer Other   ?     MGM's sister, post-menopausal breast cancer  ? Breast cancer Cousin 45  ?     paternal 2nd cousin  ? ? ?Past Medical History:  ?Diagnosis Date  ? Arthritis   ? Breast cancer ([place]) 2000  ? Stage 2B, ER+/PR-/Her2-  ? Colon polyps   ? Family history of adverse reaction to anesthesia 1959  ? Brother passed away at 38 years old from malignant hyperthermia.  ? Family history of  breast cancer   ? Family history of prostate cancer   ? Hypertension   ? ? ?Past Surgical History:  ?Procedure Laterality Date  ? ABDOMINAL HYSTERECTOMY    ? BACK SURGERY    ? BREAST SURGERY    ? COLONOSCOPY WITH PROPOFOL    ? pt said propofol burned her throat when pushed  ? IR US GUIDE BX ASP/DRAIN  04/03/2021  ? MASTECTOMY Right 2000  ? MASTECTOMY Right 2000  ? POSTERIOR CERVICAL LAMINECTOMY Left 01/23/2018  ? Procedure: LEFT CERVICAL SEVEN- THORACIC ONE LAMINOTOMY,FORAMINOTOMY, MICRODISCECTOMY;  Surgeon: Jovita Gamma, MD;  Location: [facility];  Service: Neurosurgery;  Laterality: Left;  ? ROTATOR CUFF REPAIR    ? TONSILLECTOMY    ? TRANSFORAMINAL LUMBAR INTERBODY FUSION (TLIF) WITH PEDICLE SCREW FIXATION 2 LEVEL Right 10/30/2019  ? Procedure: Right Lumbar 3-4 Lumbar 4-5 Transforaminal lumbar interbody fusion;  Surgeon: Erline Levine, MD;  Location: [facility];  Service: Neurosurgery;  Laterality: Right;  3C/RM 21  ? TUBAL LIGATION    ? WISDOM TOOTH EXTRACTION    ? ? ?Social History:  reports that she has never smoked. She has never used smokeless tobacco. She reports that she does not drink alcohol and does not use drugs. ? ?Allergies:  ?Allergies  ?Allergen Reactions  ? Penicillins Anaphylaxis  ?  DID THE REACTION INVOLVE: Swelling of the face/tongue/throat, SOB, or low BP? Yes ?Sudden or severe rash/hives, skin peeling, or the inside of the mouth or nose? No ?Did  it require medical treatment? Yes ?When did it last happen?      63 years ago ?If all above answers are ?NO?, may proceed with cephalosporin use. ?  ? ? ?Medications Prior to Admission  ?Medication Sig Dispense Refill  ? acetaminophen (TYLENOL) 500 MG tablet Take 500-1,000 mg by mouth every 6 (six) hours as needed (pain.).    ? allopurinol (ZYLOPRIM) 300 MG tablet Take 1 tablet (300 mg total) by mouth daily. 30 tablet 1  ? Calcium Citrate-Vitamin D (CALCIUM CITRATE +D PO) Take 1 tablet by mouth daily.    ? celecoxib (CELEBREX) 200 MG capsule Restart in 5 days  (Patient taking differently: Take 200 mg by mouth daily.) 1 capsule 0  ? Cholecalciferol (VITAMIN D3) 125 MCG (5000 UT) TABS Take 5,000 Units by mouth daily.    ? lidocaine-prilocaine (EMLA) cream Apply to affected area once 30 g 3  ? LORazepam (ATIVAN) 0.5 MG tablet Take 1 tablet (0.5 mg total) by mouth every 6 (six) hours as needed (Nausea or vomiting). 30 tablet 0  ? LUTEIN PO Take 1 tablet by mouth daily.    ? MAGNESIUM PO Take 1 tablet by mouth daily.    ? Multiple Vitamin (MULTIVITAMIN WITH MINERALS) TABS tablet Take 1 tablet by mouth daily.    ? naphazoline-pheniramine (ALLERGY EYE) 0.025-0.3 % ophthalmic solution Place 1 drop into both eyes 4 (four) times daily as needed for allergies.    ? Omega-3 Fatty Acids (FISH OIL ULTRA) 1400 MG CAPS Take 1,400 mg by mouth daily.    ? ondansetron (ZOFRAN) 8 MG tablet Take 1 tablet (8 mg total) by mouth 2 (two) times daily as needed for refractory nausea / vomiting. Start on day 3 after cyclophosphamide chemotherapy. 30 tablet 1  ? OVER THE COUNTER MEDICATION Take 400 mg by mouth daily. Andrographis Supplement    ? Potassium 99 MG TABS Take 99 mg by mouth daily.    ? predniSONE (DELTASONE) 20 MG tablet Take 5 tablets (100 mg total) by mouth daily. Take with food on days 1-5 of chemotherapy. 25 tablet 5  ? Probiotic Product (DAILY DIGESTIVE PROBIOTIC) CAPS Take 1 capsule by mouth 3 (three) times a week.    ? prochlorperazine (COMPAZINE) 10 MG tablet Take 1 tablet (10 mg total) by mouth every 6 (six) hours as needed (Nausea or vomiting). 30 tablet 6  ? traMADol (ULTRAM) 50 MG tablet Take 50 mg by mouth every 6 (six) hours as needed for moderate pain.    ? triamterene-hydrochlorothiazide (MAXZIDE-25) 37.5-25 MG tablet Take 1 tablet by mouth daily.     ? ? ?Prior to Admission medications   ?Medication Sig Start Date End Date Taking? Authorizing Provider  ?acetaminophen (TYLENOL) 500 MG tablet Take 500-1,000 mg by mouth every 6 (six) hours as needed (pain.).    [provider]  ?allopurinol (ZYLOPRIM) 300 MG tablet Take 1 tablet (300 mg total) by mouth daily. 05/26/21   Benay Pike, MD  ?Calcium Citrate-Vitamin D (CALCIUM CITRATE +D PO) Take 1 tablet by mouth daily.    [provider]  ?celecoxib (CELEBREX) 200 MG capsule Restart in 5 days ?Patient taking differently: Take 200 mg by mouth daily. 10/31/19   Osie Cheeks, NP  ?Cholecalciferol (VITAMIN D3) 125 MCG (5000 UT) TABS Take 5,000 Units by mouth daily.    [provider]  ?lidocaine-prilocaine (EMLA) cream Apply to affected area once 05/30/21   Benay Pike, MD  ?LORazepam (ATIVAN) 0.5 MG tablet Take 1 tablet (0.5  mg total) by mouth every 6 (six) hours as needed (Nausea or vomiting). 05/30/21   Benay Pike, MD  ?LUTEIN PO Take 1 tablet by mouth daily.    [provider]  ?MAGNESIUM PO Take 1 tablet by mouth daily.    [provider]  ?Multiple Vitamin (MULTIVITAMIN WITH MINERALS) TABS tablet Take 1 tablet by mouth daily.    [provider]  ?naphazoline-pheniramine (ALLERGY EYE) 0.025-0.3 % ophthalmic solution Place 1 drop into both eyes 4 (four) times daily as needed for allergies.    [provider]  ?Omega-3 Fatty Acids (FISH OIL ULTRA) 1400 MG CAPS Take 1,400 mg by mouth daily.    [provider]  ?ondansetron (ZOFRAN) 8 MG tablet Take 1 tablet (8 mg total) by mouth 2 (two) times daily as needed for refractory nausea / vomiting. Start on day 3 after cyclophosphamide chemotherapy. 05/30/21   Benay Pike, MD  ?OVER THE COUNTER MEDICATION Take 400 mg by mouth daily. Andrographis Supplement    [provider]  ?Potassium 99 MG TABS Take 99 mg by mouth daily.    [provider]  ?predniSONE (DELTASONE) 20 MG tablet Take 5 tablets (100 mg total) by mouth daily. Take with food on days 1-5 of chemotherapy. 05/30/21   Benay Pike, MD  ?Probiotic Product (DAILY DIGESTIVE PROBIOTIC) CAPS Take 1 capsule by mouth 3 (three) times a week.     [provider]  ?prochlorperazine (COMPAZINE) 10 MG tablet Take 1 tablet (10 mg total) by mouth every 6 (six) hours as needed (Nausea or vomiting). 05/30/21   Benay Pike, MD  ?traMADol Veatrice Bourbon) 50

## 2021-06-01 NOTE — Progress Notes (Signed)
Plan is for patient to receive R-CHOP regimen tomorrow. PICC order discontinued d/t regimen not beginning tonight. Phlebotomy to come draw labs now. Dr. Marlou Starks preferred pt not get chemo until after biopsy/port placement is done which pt is on schedule for for tomorrow at 1045. Dr. Marlou Starks said he will leave port accessed from OR. Dr. Chryl Heck and Erasmo Downer NP aware. Pt and husband aware of plan as well.  ?

## 2021-06-02 ENCOUNTER — Encounter: Payer: Self-pay | Admitting: Hematology and Oncology

## 2021-06-02 ENCOUNTER — Encounter (HOSPITAL_COMMUNITY)
Admission: RE | Disposition: A | Discharge status: Home / Self Care | Payer: Self-pay | Source: Ambulatory Visit | Attending: Hematology and Oncology

## 2021-06-02 ENCOUNTER — Inpatient Hospital Stay (HOSPITAL_COMMUNITY): Payer: BC Managed Care – PPO | Admitting: Anesthesiology

## 2021-06-02 ENCOUNTER — Inpatient Hospital Stay: Payer: Self-pay

## 2021-06-02 ENCOUNTER — Other Ambulatory Visit: Payer: Self-pay

## 2021-06-02 ENCOUNTER — Inpatient Hospital Stay (HOSPITAL_COMMUNITY): Payer: BC Managed Care – PPO

## 2021-06-02 ENCOUNTER — Encounter (HOSPITAL_COMMUNITY): Payer: Self-pay | Admitting: Hematology and Oncology

## 2021-06-02 HISTORY — PX: PORTACATH PLACEMENT: SHX2246

## 2021-06-02 HISTORY — PX: LYMPH NODE BIOPSY: SHX201

## 2021-06-02 LAB — CBC WITH DIFFERENTIAL/PLATELET
Abs Immature Granulocytes: 0.03 10*3/uL (ref 0.00–0.07)
Basophils Absolute: 0 10*3/uL (ref 0.0–0.1)
Basophils Relative: 0 %
Eosinophils Absolute: 0.1 10*3/uL (ref 0.0–0.5)
Eosinophils Relative: 3 %
HCT: 21.1 % — ABNORMAL LOW (ref 36.0–46.0)
Hemoglobin: 6.7 g/dL — CL (ref 12.0–15.0)
Immature Granulocytes: 1 %
Lymphocytes Relative: 48 %
Lymphs Abs: 1.8 10*3/uL (ref 0.7–4.0)
MCH: 30.2 pg (ref 26.0–34.0)
MCHC: 31.8 g/dL (ref 30.0–36.0)
MCV: 95 fL (ref 80.0–100.0)
Monocytes Absolute: 0.6 10*3/uL (ref 0.1–1.0)
Monocytes Relative: 16 %
Neutro Abs: 1.2 10*3/uL — ABNORMAL LOW (ref 1.7–7.7)
Neutrophils Relative %: 32 %
Platelets: 120 10*3/uL — ABNORMAL LOW (ref 150–400)
RBC: 2.22 MIL/uL — ABNORMAL LOW (ref 3.87–5.11)
RDW: 17.6 % — ABNORMAL HIGH (ref 11.5–15.5)
WBC: 3.8 10*3/uL — ABNORMAL LOW (ref 4.0–10.5)
nRBC: 0 % (ref 0.0–0.2)

## 2021-06-02 LAB — COMPREHENSIVE METABOLIC PANEL
ALT: 13 U/L (ref 0–44)
AST: 17 U/L (ref 15–41)
Albumin: 2.5 g/dL — ABNORMAL LOW (ref 3.5–5.0)
Alkaline Phosphatase: 60 U/L (ref 38–126)
Anion gap: 6 (ref 5–15)
BUN: 16 mg/dL (ref 8–23)
CO2: 26 mmol/L (ref 22–32)
Calcium: 7.9 mg/dL — ABNORMAL LOW (ref 8.9–10.3)
Chloride: 103 mmol/L (ref 98–111)
Creatinine, Ser: 0.83 mg/dL (ref 0.44–1.00)
GFR, Estimated: >60 mL/min (ref >60)
Glucose, Bld: 110 mg/dL — ABNORMAL HIGH (ref 70–99)
Potassium: 3.3 mmol/L — ABNORMAL LOW (ref 3.5–5.1)
Sodium: 135 mmol/L (ref 135–145)
Total Bilirubin: 0.7 mg/dL (ref 0.3–1.2)
Total Protein: 5.2 g/dL — ABNORMAL LOW (ref 6.5–8.1)

## 2021-06-02 LAB — PREPARE RBC (CROSSMATCH)

## 2021-06-02 LAB — LACTATE DEHYDROGENASE: LDH: 166 U/L (ref 98–192)

## 2021-06-02 LAB — URIC ACID: Uric Acid, Serum: 4.1 mg/dL (ref 2.5–7.1)

## 2021-06-02 LAB — HEMOGLOBIN AND HEMATOCRIT, BLOOD
HCT: 22.2 % — ABNORMAL LOW (ref 36.0–46.0)
Hemoglobin: 7.1 g/dL — ABNORMAL LOW (ref 12.0–15.0)

## 2021-06-02 LAB — PHOSPHORUS: Phosphorus: 4 mg/dL (ref 2.5–4.6)

## 2021-06-02 SURGERY — LYMPH NODE BIOPSY
Anesthesia: General | Site: Neck | Laterality: Right

## 2021-06-02 MED ORDER — FENTANYL CITRATE PF 50 MCG/ML IJ SOSY: 25.0000 ug | PREFILLED_SYRINGE | INTRAMUSCULAR | Status: DC | PRN

## 2021-06-02 MED ORDER — ACETAMINOPHEN 500 MG PO TABS: 1000.0000 mg | ORAL_TABLET | Freq: Once | ORAL | Status: DC

## 2021-06-02 MED ORDER — OXYCODONE HCL 5 MG PO TABS
5.0000 mg | ORAL_TABLET | ORAL | Status: DC | PRN
  Administered 2021-06-03: 5 mg via ORAL
  Filled 2021-06-02: qty 1

## 2021-06-02 MED ORDER — PANTOPRAZOLE SODIUM 40 MG IV SOLR
40.0000 mg | Freq: Every day | INTRAVENOUS | Status: DC
  Administered 2021-06-02 – 2021-06-04 (×3): 40 mg via INTRAVENOUS
  Filled 2021-06-02 (×3): qty 10

## 2021-06-02 MED ORDER — FENTANYL CITRATE (PF) 100 MCG/2ML IJ SOLN
INTRAMUSCULAR | Status: DC | PRN
  Administered 2021-06-02 (×2): 25 ug via INTRAVENOUS
  Administered 2021-06-02 (×2): 50 ug via INTRAVENOUS

## 2021-06-02 MED ORDER — VINCRISTINE SULFATE CHEMO INJECTION 1 MG/ML
2.0000 mg | Freq: Once | INTRAVENOUS | Status: AC
  Administered 2021-06-03: 2 mg via INTRAVENOUS
  Filled 2021-06-02: qty 2

## 2021-06-02 MED ORDER — FENTANYL CITRATE (PF) 250 MCG/5ML IJ SOLN
INTRAMUSCULAR | Status: AC
  Filled 2021-06-02: qty 5

## 2021-06-02 MED ORDER — BUPIVACAINE HCL (PF) 0.25 % IJ SOLN
INTRAMUSCULAR | Status: DC | PRN
  Administered 2021-06-02: 10 mL

## 2021-06-02 MED ORDER — SODIUM CHLORIDE 0.9 % IV SOLN
750.0000 mg/m2 | Freq: Once | INTRAVENOUS | Status: AC
  Administered 2021-06-03: 1560 mg via INTRAVENOUS
  Filled 2021-06-02: qty 78

## 2021-06-02 MED ORDER — DIPHENHYDRAMINE HCL 25 MG PO CAPS: 50.0000 mg | ORAL_CAPSULE | Freq: Once | ORAL | Status: DC

## 2021-06-02 MED ORDER — SODIUM CHLORIDE 0.9 % IV SOLN
10.0000 mg | Freq: Once | INTRAVENOUS | Status: AC
  Administered 2021-06-03: 10 mg via INTRAVENOUS
  Filled 2021-06-02: qty 1

## 2021-06-02 MED ORDER — AMISULPRIDE (ANTIEMETIC) 5 MG/2ML IV SOLN: 10.0000 mg | Freq: Once | INTRAVENOUS | Status: DC | PRN

## 2021-06-02 MED ORDER — PROPOFOL 10 MG/ML IV BOLUS
INTRAVENOUS | Status: AC
Start: 2021-06-02 — End: ?
  Filled 2021-06-02: qty 20

## 2021-06-02 MED ORDER — SODIUM CHLORIDE 0.9 % IV SOLN
375.0000 mg/m2 | Freq: Once | INTRAVENOUS | Status: AC
  Administered 2021-06-03: 800 mg via INTRAVENOUS
  Filled 2021-06-02: qty 80

## 2021-06-02 MED ORDER — SODIUM CHLORIDE 0.9 % IV SOLN
750.0000 mg/m2 | Freq: Once | INTRAVENOUS | Status: DC
  Filled 2021-06-02: qty 78

## 2021-06-02 MED ORDER — MIDAZOLAM HCL 2 MG/2ML IJ SOLN
INTRAMUSCULAR | Status: AC
Start: 2021-06-02 — End: ?
  Filled 2021-06-02: qty 2

## 2021-06-02 MED ORDER — KETAMINE HCL 50 MG/5ML IJ SOSY
PREFILLED_SYRINGE | INTRAMUSCULAR | Status: AC
  Filled 2021-06-02: qty 5

## 2021-06-02 MED ORDER — CHLORHEXIDINE GLUCONATE CLOTH 2 % EX PADS
6.0000 | MEDICATED_PAD | Freq: Once | CUTANEOUS | Status: AC
  Administered 2021-06-02: 6 via TOPICAL

## 2021-06-02 MED ORDER — ONDANSETRON HCL 4 MG/2ML IJ SOLN
INTRAMUSCULAR | Status: DC | PRN
  Administered 2021-06-02: 4 mg via INTRAVENOUS

## 2021-06-02 MED ORDER — ACETAMINOPHEN 325 MG PO TABS: 650.0000 mg | ORAL_TABLET | Freq: Once | ORAL | Status: DC

## 2021-06-02 MED ORDER — PROPOFOL 10 MG/ML IV BOLUS
INTRAVENOUS | Status: DC | PRN
  Administered 2021-06-02: 30 mg via INTRAVENOUS
  Administered 2021-06-02: 150 mg via INTRAVENOUS
  Administered 2021-06-02: 20 mg via INTRAVENOUS
  Administered 2021-06-02: 30 mg via INTRAVENOUS

## 2021-06-02 MED ORDER — LIDOCAINE HCL (CARDIAC) PF 100 MG/5ML IV SOSY
PREFILLED_SYRINGE | INTRAVENOUS | Status: DC | PRN
  Administered 2021-06-02: 60 mg via INTRAVENOUS

## 2021-06-02 MED ORDER — ACETAMINOPHEN 325 MG PO TABS
650.0000 mg | ORAL_TABLET | Freq: Once | ORAL | Status: AC
  Administered 2021-06-03: 650 mg via ORAL
  Filled 2021-06-02: qty 2

## 2021-06-02 MED ORDER — HEPARIN 6000 UNIT IRRIGATION SOLUTION
Freq: Once | Status: AC
  Administered 2021-06-02: 1
  Filled 2021-06-02: qty 6000

## 2021-06-02 MED ORDER — VANCOMYCIN HCL IN DEXTROSE 1-5 GM/200ML-% IV SOLN
1000.0000 mg | INTRAVENOUS | Status: AC
  Administered 2021-06-02: 1000 mg via INTRAVENOUS
  Filled 2021-06-02: qty 200

## 2021-06-02 MED ORDER — DEXAMETHASONE SODIUM PHOSPHATE 10 MG/ML IJ SOLN
INTRAMUSCULAR | Status: AC
  Filled 2021-06-02: qty 1

## 2021-06-02 MED ORDER — LACTATED RINGERS IV SOLN: INTRAVENOUS | Status: DC | PRN

## 2021-06-02 MED ORDER — PHENYLEPHRINE 80 MCG/ML (10ML) SYRINGE FOR IV PUSH (FOR BLOOD PRESSURE SUPPORT)
PREFILLED_SYRINGE | INTRAVENOUS | Status: DC | PRN
  Administered 2021-06-02 (×4): 160 ug via INTRAVENOUS

## 2021-06-02 MED ORDER — ONDANSETRON HCL 4 MG/2ML IJ SOLN: 4.0000 mg | Freq: Once | INTRAMUSCULAR | Status: DC | PRN

## 2021-06-02 MED ORDER — SODIUM CHLORIDE 0.9 % IV SOLN
150.0000 mg | Freq: Once | INTRAVENOUS | Status: DC
  Filled 2021-06-02: qty 5

## 2021-06-02 MED ORDER — LIDOCAINE HCL (PF) 2 % IJ SOLN
INTRAMUSCULAR | Status: AC
  Filled 2021-06-02: qty 5

## 2021-06-02 MED ORDER — ONDANSETRON HCL 4 MG/2ML IJ SOLN
INTRAMUSCULAR | Status: AC
  Filled 2021-06-02: qty 2

## 2021-06-02 MED ORDER — DEXAMETHASONE SODIUM PHOSPHATE 10 MG/ML IJ SOLN
INTRAMUSCULAR | Status: DC | PRN
  Administered 2021-06-02: 10 mg via INTRAVENOUS

## 2021-06-02 MED ORDER — VINCRISTINE SULFATE CHEMO INJECTION 1 MG/ML
2.0000 mg | Freq: Once | INTRAVENOUS | Status: DC
  Filled 2021-06-02: qty 2

## 2021-06-02 MED ORDER — BUPIVACAINE HCL 0.25 % IJ SOLN
INTRAMUSCULAR | Status: AC
  Filled 2021-06-02: qty 1

## 2021-06-02 MED ORDER — HEPARIN SOD (PORK) LOCK FLUSH 100 UNIT/ML IV SOLN
INTRAVENOUS | Status: AC
  Filled 2021-06-02: qty 5

## 2021-06-02 MED ORDER — MIDAZOLAM HCL 5 MG/5ML IJ SOLN
INTRAMUSCULAR | Status: DC | PRN
  Administered 2021-06-02 (×2): 1 mg via INTRAVENOUS

## 2021-06-02 MED ORDER — SODIUM CHLORIDE 0.9 % IV SOLN: Freq: Once | INTRAVENOUS | Status: DC

## 2021-06-02 MED ORDER — PREDNISONE 50 MG PO TABS: 100.0000 mg | ORAL_TABLET | Freq: Every day | ORAL | Status: DC

## 2021-06-02 MED ORDER — PALONOSETRON HCL INJECTION 0.25 MG/5ML
0.2500 mg | Freq: Once | INTRAVENOUS | Status: DC
  Filled 2021-06-02: qty 5

## 2021-06-02 MED ORDER — PROPOFOL 500 MG/50ML IV EMUL
INTRAVENOUS | Status: DC | PRN
  Administered 2021-06-02: 150 ug/kg/min via INTRAVENOUS

## 2021-06-02 MED ORDER — SODIUM CHLORIDE 0.9% IV SOLUTION: Freq: Once | INTRAVENOUS | Status: AC

## 2021-06-02 MED ORDER — HEPARIN SOD (PORK) LOCK FLUSH 100 UNIT/ML IV SOLN
INTRAVENOUS | Status: DC | PRN
Start: 2021-06-02 — End: 2021-06-02
  Administered 2021-06-02: 500 [IU]

## 2021-06-02 MED ORDER — EPHEDRINE SULFATE-NACL 50-0.9 MG/10ML-% IV SOSY
PREFILLED_SYRINGE | INTRAVENOUS | Status: DC | PRN
  Administered 2021-06-02: 5 mg via INTRAVENOUS

## 2021-06-02 MED ORDER — DIPHENHYDRAMINE HCL 25 MG PO CAPS
50.0000 mg | ORAL_CAPSULE | Freq: Once | ORAL | Status: AC
  Administered 2021-06-03: 50 mg via ORAL
  Filled 2021-06-02: qty 2

## 2021-06-02 MED ORDER — SODIUM CHLORIDE 0.9 % IR SOLN
Status: DC | PRN
  Administered 2021-06-02: 1000 mL

## 2021-06-02 MED ORDER — SODIUM CHLORIDE 0.9 % IV SOLN: 375.0000 mg/m2 | Freq: Once | INTRAVENOUS | Status: DC

## 2021-06-02 MED ORDER — SODIUM CHLORIDE 0.9 % IV SOLN
10.0000 mg | Freq: Once | INTRAVENOUS | Status: DC
  Filled 2021-06-02: qty 1

## 2021-06-02 MED ORDER — PALONOSETRON HCL INJECTION 0.25 MG/5ML
0.2500 mg | Freq: Once | INTRAVENOUS | Status: AC
  Administered 2021-06-03: 0.25 mg via INTRAVENOUS
  Filled 2021-06-02: qty 5

## 2021-06-02 MED ORDER — ACETAMINOPHEN 500 MG PO TABS
1000.0000 mg | ORAL_TABLET | ORAL | Status: AC
  Administered 2021-06-02: 1000 mg via ORAL
  Filled 2021-06-02: qty 2

## 2021-06-02 MED ORDER — KETAMINE HCL 10 MG/ML IJ SOLN
INTRAMUSCULAR | Status: DC | PRN
  Administered 2021-06-02: 20 mg via INTRAVENOUS

## 2021-06-02 SURGICAL SUPPLY — 51 items
ADH SKN CLS APL DERMABOND .7 (GAUZE/BANDAGES/DRESSINGS) ×2
APL SKNCLS STERI-STRIP NONHPOA (GAUZE/BANDAGES/DRESSINGS)
BAG COUNTER SPONGE SURGICOUNT (BAG) IMPLANT
BAG DECANTER FOR FLEXI CONT (MISCELLANEOUS) ×3 IMPLANT
BAG SPNG CNTER NS LX DISP (BAG)
BENZOIN TINCTURE PRP APPL 2/3 (GAUZE/BANDAGES/DRESSINGS) IMPLANT
BLADE HEX COATED 2.75 (ELECTRODE) ×3 IMPLANT
BLADE SURG 15 STRL LF DISP TIS (BLADE) ×2 IMPLANT
BLADE SURG 15 STRL SS (BLADE) ×3
BLADE SURG SZ11 CARB STEEL (BLADE) ×1 IMPLANT
COVER PROBE U/S 5X48 (MISCELLANEOUS) ×1 IMPLANT
COVER SURGICAL LIGHT HANDLE (MISCELLANEOUS) ×1 IMPLANT
DERMABOND ADVANCED (GAUZE/BANDAGES/DRESSINGS) ×1
DERMABOND ADVANCED .7 DNX12 (GAUZE/BANDAGES/DRESSINGS) IMPLANT
DRAPE C-ARM 42X120 X-RAY (DRAPES) ×3 IMPLANT
DRAPE LAPAROSCOPIC ABDOMINAL (DRAPES) ×3 IMPLANT
DRSG TEGADERM 2-3/8X2-3/4 SM (GAUZE/BANDAGES/DRESSINGS) ×1 IMPLANT
DRSG TEGADERM 4X4.75 (GAUZE/BANDAGES/DRESSINGS) IMPLANT
ELECT REM PT RETURN 15FT ADLT (MISCELLANEOUS) ×3 IMPLANT
GAUZE 4X4 16PLY ~~LOC~~+RFID DBL (SPONGE) ×3 IMPLANT
GAUZE SPONGE 2X2 8PLY STRL LF (GAUZE/BANDAGES/DRESSINGS) IMPLANT
GAUZE SPONGE 4X4 12PLY STRL (GAUZE/BANDAGES/DRESSINGS) IMPLANT
GLOVE BIO SURGEON STRL SZ7.5 (GLOVE) ×6 IMPLANT
GLOVE BIOGEL PI IND STRL 6.5 (GLOVE) IMPLANT
GLOVE BIOGEL PI IND STRL 7.0 (GLOVE) ×2 IMPLANT
GLOVE BIOGEL PI INDICATOR 6.5 (GLOVE) ×1
GLOVE BIOGEL PI INDICATOR 7.0 (GLOVE) ×2
GOWN STRL REUS W/ TWL LRG LVL3 (GOWN DISPOSABLE) ×2 IMPLANT
GOWN STRL REUS W/ TWL XL LVL3 (GOWN DISPOSABLE) ×2 IMPLANT
GOWN STRL REUS W/TWL LRG LVL3 (GOWN DISPOSABLE) ×6
GOWN STRL REUS W/TWL XL LVL3 (GOWN DISPOSABLE) ×3
KIT BASIN OR (CUSTOM PROCEDURE TRAY) ×3 IMPLANT
KIT PORT POWER 8FR ISP CVUE (Port) ×1 IMPLANT
KIT TURNOVER KIT A (KITS) IMPLANT
NDL HYPO 25X1 1.5 SAFETY (NEEDLE) ×2 IMPLANT
NEEDLE HYPO 25X1 1.5 SAFETY (NEEDLE) ×3 IMPLANT
NS IRRIG 1000ML POUR BTL (IV SOLUTION) ×3 IMPLANT
PACK BASIC VI WITH GOWN DISP (CUSTOM PROCEDURE TRAY) ×3 IMPLANT
PENCIL SMOKE EVACUATOR (MISCELLANEOUS) IMPLANT
SPIKE FLUID TRANSFER (MISCELLANEOUS) ×2 IMPLANT
SPONGE GAUZE 2X2 STER 10/PKG (GAUZE/BANDAGES/DRESSINGS) ×1
STRIP CLOSURE SKIN 1/2X4 (GAUZE/BANDAGES/DRESSINGS) IMPLANT
SUT MNCRL AB 4-0 PS2 18 (SUTURE) ×3 IMPLANT
SUT PROLENE 2 0 SH DA (SUTURE) ×3 IMPLANT
SUT SILK 2 0 (SUTURE)
SUT SILK 2-0 30XBRD TIE 12 (SUTURE) IMPLANT
SUT VIC AB 3-0 SH 27 (SUTURE) ×3
SUT VIC AB 3-0 SH 27XBRD (SUTURE) ×2 IMPLANT
SYR 10ML LL (SYRINGE) ×3 IMPLANT
SYR CONTROL 10ML LL (SYRINGE) ×3 IMPLANT
TOWEL OR 17X26 10 PK STRL BLUE (TOWEL DISPOSABLE) ×3 IMPLANT

## 2021-06-02 NOTE — Op Note (Signed)
06/02/2021 ? ?4:26 PM ? ?PATIENT:  Barbara Rhodes  66 y.o. female ? ?PRE-OPERATIVE DIAGNOSIS:  CERVICAL LYMPHADENOPATHY ? ?POST-OPERATIVE DIAGNOSIS:  CERVICAL LYMPHADENOPATHY ? ?PROCEDURE:  Procedure(s): ?EXCISIONAL CERVICAL LYMPH NODE BIOPSY (Left) ?INSERTION PORT-A-CATH (Right) ? ?SURGEON:  Surgeon(s) and Role: ?   Jovita Kussmaul, MD - Primary ? ?PHYSICIAN ASSISTANT:  ? ?ASSISTANTS: none  ? ?ANESTHESIA:   local and general ? ?EBL:  50 mL  ? ?BLOOD ADMINISTERED:none ? ?DRAINS: none  ? ?LOCAL MEDICATIONS USED:  MARCAINE    ? ?SPECIMEN:  Source of Specimen:  Left cervical lymph node ? ?DISPOSITION OF SPECIMEN:  PATHOLOGY ? ?COUNTS:  YES ? ?TOURNIQUET:  * No tourniquets in log * ? ?DICTATION: .Dragon Dictation ? ?After informed consent was obtained the patient was brought to the operating room and placed in the supine position on the operating table.  After adequate induction of general anesthesia a roll was placed between the patient's shoulder blades to extend the shoulder slightly.  The chest and neck area were then prepped with ChloraPrep, allowed to dry, and draped in usual sterile manner.  Attention was first turned to the left neck.  The patient had extensive lymphadenopathy on this side.  I chose the most superficial mobile lymph node.  The area around this was infiltrated with quarter percent Marcaine.  A small transversely oriented incision was made overlying the palpable lymph node.  The incision was carried through the skin and subcutaneous tissue sharply with the electrocautery.  The lymph node was identified by blunt hemostat dissection.  The lymph node was then excised sharply using the harmonic scalpel.  The lymph node was then sent to pathology for lymphoma protocol.  Hemostasis was achieved using the Bovie electrocautery.  The deep layer of the incision was then closed with interrupted 3-0 Vicryl stitches.  The skin was closed with a running 4-0 Monocryl subcuticular stitch.  Dermabond  dressings were applied.  Attention was then turned to the right neck and chest area.  The patient was placed in Trendelenburg position.  I used the ultrasound to identify the right internal jugular vein.  Under direct vision with the ultrasound I was able to access the right internal jugular vein with the large bore needle from the Port-A-Cath kit.  The wire was then fed through the needle using the Seldinger technique.  The wire was confirmed in the central venous system using real-time fluoroscopy.  Next a small incision was made on the right chest wall just lateral to the bend of the clavicle and a small incision was made at the wire entry site on the right neck.  The 2 incisions were connected by blunt hemostat dissection.  A tendon passer was then placed through the tunnel and used to bring the tubing through the tunnel.  The tubing was placed on the reservoir.  The reservoir was placed in the pocket and the length of the tubing was estimated using real-time fluoroscopy.  The tubing was cut to the appropriate length.  Next a sheath and dilator were fed over the wire using the Seldinger technique without difficulty.  The dilator and wire were removed from the patient.  The tubing was fed through the sheath as far as it would go and then held in place while the sheath was gently cracked and separated.  Another real-time fluoroscopy image showed the tip of the catheter to be in the distal superior vena cava.  The tubing was then permanently anchored to the reservoir.  The reservoir was anchored in the pocket with two 2-0 Prolene stitches.  The port was then aspirated and it aspirated blood easily.  The port was then flushed initially with a dilute heparin solution.  The incision in the right neck was closed with an interrupted 4-0 Monocryl subcuticular stitch.  The subcutaneous tissue was closed over the port with interrupted 3-0 Vicryl stitches.  The skin was closed with a running 4-0 Monocryl subcuticular  stitch.  Dermabond dressings were applied.  The port was then accessed with the access needle from the kit and flushed with the more concentrated heparin solution.  Sterile dressings were applied.  The patient tolerated the procedure well.  At the end of the case all needle sponge and instrument counts were correct.  The patient was then awakened and taken to recovery in stable condition. ? ?PLAN OF CARE: Admit to inpatient  ? ?PATIENT DISPOSITION:  PACU - hemodynamically stable. ?  ?Delay start of Pharmacological VTE agent (>24hrs) due to surgical blood loss or risk of bleeding: no ? ?

## 2021-06-02 NOTE — Anesthesia Procedure Notes (Signed)
Procedure Name: LMA Insertion ?Date/Time: 06/02/2021 2:29 PM ?Performed by: Garrel Ridgel, CRNA ?Pre-anesthesia Checklist: Patient identified, Emergency Drugs available, Suction available and Patient being monitored ?Patient Re-evaluated:Patient Re-evaluated prior to induction ?Oxygen Delivery Method: Circle system utilized ?Preoxygenation: Pre-oxygenation with 100% oxygen ?Induction Type: IV induction ?Ventilation: Mask ventilation without difficulty ?LMA: LMA inserted ?LMA Size: 4.0 ?Number of attempts: 1 ?Placement Confirmation: positive ETCO2 ?Tube secured with: Tape ?Dental Injury: Teeth and Oropharynx as per pre-operative assessment  ? ? ? ? ?

## 2021-06-02 NOTE — Anesthesia Preprocedure Evaluation (Addendum)
Anesthesia Evaluation  ?Patient identified by MRN, date of birth, ID band ?Patient awake ? ? ? ?Reviewed: ?Allergy & Precautions, NPO status , Patient's Chart, lab work & pertinent test results ? ?History of Anesthesia Complications ?(+) Family history of anesthesia reaction and history of anesthetic complications (fam history of MH - brother died at age 68 of [place] from halothane) ? ?Airway ?Mallampati: II ? ?TM Distance: >3 FB ?Neck ROM: Full ? ? ? Dental ?no notable dental hx. ? ?  ?Pulmonary ?neg pulmonary ROS,  ?  ?Pulmonary exam normal ?breath sounds clear to auscultation ? ? ? ? ? ? Cardiovascular ?hypertension, Normal cardiovascular exam ?Rhythm:Regular Rate:Normal ? ? ??1. Left ventricular ejection fraction, by estimation, is 60 to 65%. Left  ?ventricular ejection fraction by PLAX is 62 %. The left ventricle has  ?normal function. The left ventricle has no regional wall motion  ?abnormalities. Left ventricular diastolic  ?parameters are consistent with Grade I diastolic dysfunction (impaired  ?relaxation).  ??2. Right ventricular systolic function is hyperdynamic. The right  ?ventricular size is normal. There is normal pulmonary artery systolic  ?pressure. The estimated right ventricular systolic pressure is 34.1 mmHg.  ??3. The mitral valve is normal in structure. No evidence of mitral valve  ?regurgitation.  ??4. The aortic valve is tricuspid. Aortic valve regurgitation is not  ?visualized.  ??5. The inferior vena cava is normal in size with greater than 50%  ?respiratory variability, suggesting right atrial pressure of 3 mmHg.  ??6. Cannot exclude a small PFO. Atrial septum is aneruysmal.  ?  ?Neuro/Psych ?negative neurological ROS ? negative psych ROS  ? GI/Hepatic ?  ?Endo/Other  ? ? Renal/GU ?  ? ?  ?Musculoskeletal ? ?(+) Arthritis ,  ? Abdominal ?  ?Peds ? Hematology ? ?(+) Blood dyscrasia (Hgb 6.5 this AM), anemia ,   ?Anesthesia Other Findings ?Breast  cancer ?lymphoma ? Reproductive/Obstetrics ? ?  ? ? ? ? ? ? ? ? ? ? ? ? ? ?  ?  ? ? ? ? ?Anesthesia Physical ?Anesthesia Plan ? ?ASA: 4 ? ?Anesthesia Plan: General  ? ?Post-op Pain Management:   ? ?Induction: Intravenous ? ?PONV Risk Score and Plan: 3 and Treatment may vary due to age or medical condition, Propofol infusion, TIVA, Ondansetron, Dexamethasone and Midazolam ? ?Airway Management Planned: LMA ? ?Additional Equipment: None ? ?Intra-op Plan:  ? ?Post-operative Plan: Extubation in OR ? ?Informed Consent:  ? ?Patient has DNR.  ?Discussed DNR with patient and Continue DNR. ?  ? ? ?Plan Discussed with:  ? ?Anesthesia Plan Comments: (Discussed patient with her primary team once we learned patient does NOT have IV access, a type and screen. She has not received any IVF or a blood transfusion. Current Hgb is 6.5.  She has a family history of MH so we will use MH precautions. I discussed these issues with Dr. Chryl Heck who is listed as the primary contact for this patient. Norton Blizzard, MD 06/02/21 @ 0830 ? ?Patient wants DNR to continue in the sense that she does not want chest compression, defibrillation or intubation unless intubation is needed for the surgery. Patient has a history of breast cancer, but no nodes were removed from her axilla. She is OK to have blood pressure and IV's on the right arm. Norton Blizzard, MD  ?)  ? ? ? ? ? ?Anesthesia Quick Evaluation ? ?

## 2021-06-02 NOTE — Progress Notes (Addendum)
HEMATOLOGY-ONCOLOGY PROGRESS NOTE ? ?ASSESSMENT AND PLAN: ?Ms. Consalvo is a 66 year old female who is being admitted to the hospital today to initiate systemic chemotherapy with R-CHOP for lymphoma.  She received chemotherapy education in our office earlier this week.  She is at high risk for tumor lysis syndrome and will need close monitoring. ? ?Plan is to go to the OR later this morning for excisional lymph node biopsy and Port-A-Cath placement.  However, has developed significant anemia.  Will transfuse 2 units PRBCs today.  We will get peripheral IV placed as quickly as possible.  Discussed with anesthesia and they would like at least 1 unit transfusing prior to OR.  Depending on timing, will consider starting CHOP today and omit Adriamycin.  We will consider rituximab over the weekend.  If unable to go to the OR today for Port-A-Cath placement, will have PICC line placed urgently to begin chemotherapy as soon as possible. ?  ?1.  Transfuse 2 units PRBCs today.  Needs 1 unit as soon as possible prior to OR.  IV team has been consulted for peripheral IV placement to get blood started. ?2.  Hopefully will go to the OR today for excisional lymph node biopsy and Port-A-Cath placement.  If unable to go to the OR, will place PICC line urgently to begin chemotherapy as soon as possible. ?3.  She is high risk for tumor lysis syndrome and will initiate IV fluids with normal saline at 75 cc/h. ?4.  Will monitor tumor lysis syndrome labs closely on a daily basis. ?5.  Continue home medications including allopurinol and antiemetics. ?6.  Baseline echocardiogram obtained on 06/01/2021 with LVEF of 60 to 65%. ?7.  Lovenox for DVT prophylaxis (hold per surgery for Port-A-Cath placement and excisional lymph node biopsy). ?8.  N.p.o. pending excisional lymph node biopsy and Port-A-Cath placement.  We will resume regular diet following procedure. ?9.  Will add oxycodone 5 mg every 4 hours as needed for pain per patient  request.  She will try Tylenol first. ?10.  The patient is a DNR/DNI. ? ?SUBJECTIVE: Has no specific complaints morning.  Trying to drink more.  No bleeding reported.  Voiding well.  Bowels moved yesterday.  Had some pain overnight.  Requesting something stronger than tramadol. ? ?Oncology History  ?Breast cancer ([place])  ?01/22/1998 Initial Diagnosis  ? Breast cancer ([place]) ? ?  ?09/28/2016 Genetic Testing  ? BRCA2 [id number] (Splice acceptor) pathogenic variant and NBN c.938C>T VUS was identified on the common hereditary cancer panel.  The Hereditary Gene Panel offered by [facility] includes sequencing and/or deletion duplication testing of the following 46 genes: APC, ATM, AXIN2, BARD1, BMPR1A, BRCA1, BRCA2, BRIP1, CDH1, CDKN2A (p14ARF), CDKN2A (p16INK4a), CHEK2, CTNNA1, DICER1, EPCAM (Deletion/duplication testing only), GREM1 (promoter region deletion/duplication testing only), KIT, MEN1, MLH1, MSH2, MSH3, MSH6, MUTYH, NBN, NF1, NHTL1, PALB2, PDGFRA, PMS2, POLD1, POLE, PTEN, RAD50, RAD51C, RAD51D, SDHB, SDHC, SDHD, SMAD4, SMARCA4. STK11, TP53, TSC1, TSC2, and VHL.  The following genes were evaluated for sequence changes only: SDHA and HOXB13 c.251G>A variant only.  The report date is September 28, 2016. ? ? ?  ?06/02/2021 -  Chemotherapy  ? Patient is on Treatment Plan : NON-HODGKINS LYMPHOMA R-CHOP q21d  ? ?   ? ? ? ?REVIEW OF SYSTEMS:   ?Review of Systems  ?Constitutional:  Negative for chills and fever.  ?HENT: Negative.    ?Respiratory: Negative.    ?Cardiovascular: Negative.   ?Genitourinary: Negative.   ?Skin: Negative.   ?Neurological: Negative.   ?Endo/Heme/Allergies:  Negative.   ? ?I have reviewed the past medical history, past surgical history, social history and family history with the patient and they are unchanged from previous note. ? ? ?PHYSICAL EXAMINATION: ?ECOG PERFORMANCE STATUS: 1 - Symptomatic but completely ambulatory ? ?Vitals:  ? 06/02/21 0125 06/02/21 0440  ?BP: (!) 111/58 (!) 119/57  ?Pulse: 100  91  ?Resp: 16 16  ?Temp: 98.6 ?F (37 ?C) 98.2 ?F (36.8 ?C)  ?SpO2: 94%   ? ?Filed Weights  ? 06/01/21 1900  ?Weight: 94.8 kg  ? ? ?Intake/Output from previous day: ?No intake/output data recorded. ? ?Physical Exam ?Vitals reviewed.  ?Constitutional:   ?   General: She is not in acute distress. ?Eyes:  ?   General: No scleral icterus. ?   Conjunctiva/sclera: Conjunctivae normal.  ?Cardiovascular:  ?   Rate and Rhythm: Normal rate and regular rhythm.  ?Pulmonary:  ?   Effort: Pulmonary effort is normal. No respiratory distress.  ?   Breath sounds: Normal breath sounds.  ?Abdominal:  ?   Palpations: Abdomen is soft.  ?   Tenderness: There is no abdominal tenderness.  ?Lymphadenopathy:  ?   Comments: Palpable left cervical and supraclavicular adenopathy  ?Skin: ?   General: Skin is warm and dry.  ?Neurological:  ?   Mental Status: She is alert and oriented to person, place, and time.  ? ? ?LABORATORY DATA:  ?I have reviewed the data as listed ? ?  Latest Ref Rng & Units 06/02/2021  ?  5:18 AM 06/01/2021  ?  4:31 PM 05/25/2021  ?  3:19 PM  ?CMP  ?Glucose 70 - 99 mg/dL 110   115   125    ?BUN 8 - 23 mg/dL PENDING   16   16    ?Creatinine 0.44 - 1.00 mg/dL 0.83   0.86   1.01    ?Sodium 135 - 145 mmol/L 135   135   137    ?Potassium 3.5 - 5.1 mmol/L 3.3   3.5   3.4    ?Chloride 98 - 111 mmol/L 103   102   99    ?CO2 22 - 32 mmol/L _0 ?Calcium 8.9 - 10.3 mg/dL 7.9   8.6   9.1    ?Total Protein 6.5 - 8.1 g/dL 5.2   6.3   6.6    ?Total Bilirubin 0.3 - 1.2 mg/dL 0.7   1.0   0.9    ?Alkaline Phos 38 - 126 U/L 60   74   88    ?AST 15 - 41 U/L _1 ?ALT 0 - 44 U/L _2 ? ? ?Lab Results  ?Component Value Date  ? WBC 3.8 (L) 06/02/2021  ? HGB 7.1 (L) 06/02/2021  ? HCT 22.2 (L) 06/02/2021  ? MCV 95.0 06/02/2021  ? PLT 120 (L) 06/02/2021  ? NEUTROABS 1.2 (L) 06/02/2021  ? ? ?No results found for: CEA1, CEA, [id number], CA125, PSA1 ? ?NM PET Image Initial (PI) Skull Base To Thigh (F-18 FDG) ? ?Result Date:  05/10/2021 ?CLINICAL DATA:  Initial treatment strategy for lymphoma. EXAM: NUCLEAR MEDICINE PET SKULL BASE TO THIGH TECHNIQUE: 10.4 mCi F-18 FDG was injected intravenously. Full-ring PET imaging was performed from the skull base to thigh after the radiotracer. CT data was obtained and used for attenuation correction and anatomic localization. Fasting blood glucose:  125 mg/dl COMPARISON:  Comparison made with CT of the neck of March 10, 2021. FINDINGS: Mediastinal blood pool activity: SUV max 3.10 Liver activity: SUV max 4.81 NECK: Bulky adenopathy in the LEFT neck tracking through the thoracic inlet. (Image 42/4) adenopathy is nearly confluent with the largest individual lymph node measuring 25 mm short axis previously approximately 14 mm. (Image 34/4) 20 mm lymph node in the LEFT supraclavicular region/low neck along the posterior margin of the sternocleidomastoid was previously approximately 13 mm. Enlarged lymph nodes extend along the length of the LEFT clavicle with a maximum SUV of 23.9 in this vicinity. Lymph nodes extend to the level of the hyoid in the LEFT neck and into the superior mediastinum along the LEFT neck involving posterior supraclavicular and low cervical triangle lymph nodes as well. Incidental CT findings: None CHEST: Enlarging mediastinal and axillary lymph nodes. (Image 58/4) 2 cm anterior mediastinal/prevascular lymph node with a maximum SUV of 2.6, previously less than a cm. Adjacent AP window lymph node with similar SUV values measures 16 mm short axis (image 61/4) (Image 65/4) maximum SUV of a subcarinal lymph node is 21, lymph node measuring 12 mm LEFT infrahilar/juxta aortic adenopathy largest measuring 20 mm short axis showing similar SUV values with lymph nodes tracking along the LEFT and RIGHT of the aorta into the retrocrural region and into the abdomen. No suspicious pulmonary nodule. Incidental CT findings: No consolidation or pleural effusion. Aortic atherosclerosis. No  aneurysm. No pericardial fluid. Limited assessment of cardiovascular structures given lack of intravenous contrast. Signs of RIGHT mastectomy and axillary dissection without increased metabolic activity in the RIGHT a

## 2021-06-02 NOTE — Transfer of Care (Signed)
Immediate Anesthesia Transfer of Care Note ? ?Patient: Barbara Rhodes ? ?Procedure(s) Performed: EXCISIONAL CERVICAL LYMPH NODE BIOPSY (Left: Neck) ?INSERTION PORT-A-CATH (Right: Chest) ? ?Patient Location: PACU ? ?Anesthesia Type:General ? ?Level of Consciousness: drowsy ? ?Airway & Oxygen Therapy: Patient Spontanous Breathing and Patient connected to face mask oxygen ? ?Post-op Assessment: Report given to RN and Post -op Vital signs reviewed and stable ? ?Post vital signs: Reviewed and stable ? ?Last Vitals:  ?Vitals Value Taken Time  ?BP 100/56 06/02/21 1615  ?Temp    ?Pulse 85 06/02/21 1616  ?Resp 20 06/02/21 1616  ?SpO2 91 % 06/02/21 1616  ?Vitals shown include unvalidated device data. ? ?Last Pain:  ?Vitals:  ? 06/02/21 1407  ?TempSrc: Oral  ?PainSc:   ?   ? ?  ? ?Complications: No notable events documented. ?

## 2021-06-02 NOTE — Progress Notes (Signed)
Date and time results received: 06/02/21 0620 ? ?Test: hemoglobin ?Lab Value: 6.7 ? ?Name of Provider Notified: [facility] ? ?Orders Received? Or Actions Taken?: MD notified, awaiting orders  ?

## 2021-06-02 NOTE — Interval H&P Note (Signed)
History and Physical Interval Note: ? ?06/02/2021 ?1:32 PM ? ?Barbara Rhodes  has presented today for surgery, with the diagnosis of CERVICAL LYMPHADENOPATHY.  The various methods of treatment have been discussed with the patient and family. After consideration of risks, benefits and other options for treatment, the patient has consented to  Procedure(s): ?EXCISIONAL CERVICAL LYMPH NODE BIOPSY (N/A) ?INSERTION PORT-A-CATH (N/A) as a surgical intervention.  The patient's history has been reviewed, patient examined, no change in status, stable for surgery.  I have reviewed the patient's chart and labs.  Questions were answered to the patient's satisfaction.   ? ? ?Autumn Messing III ? ? ?

## 2021-06-02 NOTE — Progress Notes (Signed)
Receiving blood via 20 g PIV, will receive IP while in surgery this pm. Mikey Bussing, NP aware and says patient will not need PICC insertion. ?

## 2021-06-03 ENCOUNTER — Encounter: Payer: Self-pay | Admitting: Hematology and Oncology

## 2021-06-03 DIAGNOSIS — C8590 Non-Hodgkin lymphoma, unspecified, unspecified site: Secondary | ICD-10-CM | POA: Diagnosis not present

## 2021-06-03 LAB — BPAM RBC
Blood Product Expiration Date: 202306142359
Blood Product Expiration Date: 202306152359
ISSUE DATE / TIME: 202305121012
ISSUE DATE / TIME: 202305121209
Unit Type and Rh: 9500
Unit Type and Rh: 9500

## 2021-06-03 LAB — COMPREHENSIVE METABOLIC PANEL
ALT: 14 U/L (ref 0–44)
AST: 14 U/L — ABNORMAL LOW (ref 15–41)
Albumin: 2.6 g/dL — ABNORMAL LOW (ref 3.5–5.0)
Alkaline Phosphatase: 66 U/L (ref 38–126)
Anion gap: 6 (ref 5–15)
BUN: 15 mg/dL (ref 8–23)
CO2: 25 mmol/L (ref 22–32)
Calcium: 7.9 mg/dL — ABNORMAL LOW (ref 8.9–10.3)
Chloride: 109 mmol/L (ref 98–111)
Creatinine, Ser: 0.69 mg/dL (ref 0.44–1.00)
GFR, Estimated: >60 mL/min (ref >60)
Glucose, Bld: 152 mg/dL — ABNORMAL HIGH (ref 70–99)
Potassium: 3.2 mmol/L — ABNORMAL LOW (ref 3.5–5.1)
Sodium: 140 mmol/L (ref 135–145)
Total Bilirubin: 0.7 mg/dL (ref 0.3–1.2)
Total Protein: 5.4 g/dL — ABNORMAL LOW (ref 6.5–8.1)

## 2021-06-03 LAB — CBC WITH DIFFERENTIAL/PLATELET
Abs Immature Granulocytes: 0.03 10*3/uL (ref 0.00–0.07)
Basophils Absolute: 0 10*3/uL (ref 0.0–0.1)
Basophils Relative: 0 %
Eosinophils Absolute: 0 10*3/uL (ref 0.0–0.5)
Eosinophils Relative: 0 %
HCT: 27.5 % — ABNORMAL LOW (ref 36.0–46.0)
Hemoglobin: 9 g/dL — ABNORMAL LOW (ref 12.0–15.0)
Immature Granulocytes: 1 %
Lymphocytes Relative: 30 %
Lymphs Abs: 1.3 10*3/uL (ref 0.7–4.0)
MCH: 30 pg (ref 26.0–34.0)
MCHC: 32.7 g/dL (ref 30.0–36.0)
MCV: 91.7 fL (ref 80.0–100.0)
Monocytes Absolute: 0.8 10*3/uL (ref 0.1–1.0)
Monocytes Relative: 19 %
Neutro Abs: 2.2 10*3/uL (ref 1.7–7.7)
Neutrophils Relative %: 50 %
Platelets: 120 10*3/uL — ABNORMAL LOW (ref 150–400)
RBC: 3 MIL/uL — ABNORMAL LOW (ref 3.87–5.11)
RDW: 18.1 % — ABNORMAL HIGH (ref 11.5–15.5)
WBC: 4.3 10*3/uL (ref 4.0–10.5)
nRBC: 0 % (ref 0.0–0.2)

## 2021-06-03 LAB — TYPE AND SCREEN
ABO/RH(D): O NEG
Antibody Screen: NEGATIVE
Unit division: 0
Unit division: 0

## 2021-06-03 LAB — PHOSPHORUS: Phosphorus: 3.4 mg/dL (ref 2.5–4.6)

## 2021-06-03 LAB — URIC ACID: Uric Acid, Serum: 2.7 mg/dL (ref 2.5–7.1)

## 2021-06-03 LAB — LACTATE DEHYDROGENASE: LDH: 145 U/L (ref 98–192)

## 2021-06-03 MED ORDER — POTASSIUM CHLORIDE IN NACL 20-0.9 MEQ/L-% IV SOLN
INTRAVENOUS | Status: DC
  Filled 2021-06-03 (×4): qty 1000

## 2021-06-03 MED ORDER — DIPHENHYDRAMINE HCL 50 MG/ML IJ SOLN
25.0000 mg | Freq: Once | INTRAMUSCULAR | Status: AC
  Administered 2021-06-03: 25 mg via INTRAVENOUS

## 2021-06-03 MED ORDER — POLYETHYLENE GLYCOL 3350 17 G PO PACK
17.0000 g | PACK | Freq: Every day | ORAL | Status: DC
Start: 2021-06-03 — End: 2021-06-05
  Administered 2021-06-03 – 2021-06-05 (×3): 17 g via ORAL
  Filled 2021-06-03 (×3): qty 1

## 2021-06-03 MED ORDER — FAMOTIDINE IN NACL 20-0.9 MG/50ML-% IV SOLN
20.0000 mg | Freq: Once | INTRAVENOUS | Status: AC
  Administered 2021-06-03: 20 mg via INTRAVENOUS

## 2021-06-03 MED ORDER — METHYLPREDNISOLONE SODIUM SUCC 125 MG IJ SOLR
125.0000 mg | Freq: Once | INTRAMUSCULAR | Status: AC
  Administered 2021-06-03: 125 mg via INTRAVENOUS

## 2021-06-03 MED ORDER — PREDNISONE 50 MG PO TABS
100.0000 mg | ORAL_TABLET | Freq: Every day | ORAL | Status: DC
  Administered 2021-06-04 – 2021-06-05 (×2): 100 mg via ORAL
  Filled 2021-06-03 (×2): qty 2

## 2021-06-03 MED ORDER — SODIUM CHLORIDE 0.9 % IV SOLN: INTRAVENOUS | Status: DC

## 2021-06-03 MED ORDER — CHLORHEXIDINE GLUCONATE CLOTH 2 % EX PADS
6.0000 | MEDICATED_PAD | Freq: Every day | CUTANEOUS | Status: DC
  Administered 2021-06-04 – 2021-06-05 (×2): 6 via TOPICAL

## 2021-06-03 NOTE — Progress Notes (Signed)
IP PROGRESS NOTE ? ?Subjective:  ? ?Mr: Barbara Rhodes has been diagnosed with non-Hodgkin's lymphoma.  She was admitted by Dr. Chryl Heck for cycle 1 CHOP-rituximab.  She underwent placement of a Port-A-Cath and cervical lymph node biopsy yesterday.  She reports mild discomfort at the left axilla.  She has noted mild swelling in the right lower leg for the past month. ? ? ?Objective: ?Vital signs in last 24 hours: ?Blood pressure 101/62, pulse 74, temperature (!) 97.5 ?F (36.4 ?C), temperature source Oral, resp. rate 18, height '5\' 4"'$  (1.626 m), weight 208 lb 15.9 oz (94.8 kg), SpO2 98 %. ? ?Intake/Output from previous day: ?05/12 0701 - 05/13 0700 ?In: 2614 [I.V.:1750; JEHUD:149; IV Piggyback:200] ?Out: 50 [Blood:50] ? ?Physical Exam: ? ?HEENT: No thrush ?Lungs: Clear bilaterally ?Cardiac: Regular rate and rhythm ?Abdomen: No hepatosplenomegaly ?Extremities: The right lower leg is slightly larger than the left side, no edema or tenderness or erythema ?Lymph nodes: Bulky left lower neck nodes, no axillary nodes ?Breast: Status post right mastectomy.  No evidence for chest wall tumor recurrence ? ?Portacath/PICC-with a surrounding ecchymosis ? ?Lab Results: ?Recent Labs  ?  06/01/21 ?7026 06/02/21 ?0518 06/02/21 ?0753  ?WBC 4.1 3.8*  --   ?HGB 8.2* 6.7* 7.1*  ?HCT 25.3* 21.1* 22.2*  ?PLT 132* 120*  --   ? ? ?BMET ?Recent Labs  ?  06/01/21 ?1631 06/02/21 ?0518  ?NA 135 135  ?K 3.5 3.3*  ?CL 102 103  ?CO2 24 26  ?GLUCOSE 115* 110*  ?BUN 16 16  ?CREATININE 0.86 0.83  ?CALCIUM 8.6* 7.9*  ? ? ?No results found for: CEA1, CEA, [id number], CA125 ? ?Studies/Results: ?DG Chest Port 1 View ? ?Result Date: 06/02/2021 ?CLINICAL DATA:  Lymphoma.  Right Port-A-Cath placement. EXAM: PORTABLE CHEST 1 VIEW COMPARISON:  PET-CT 05/10/2021 FINDINGS: The heart is within normal limits in size. Mediastinal and hilar prominence consistent with known adenopathy. No acute pulmonary findings. Right IJ power port tip is in the right atrium, approximately 8  cm below the carina. IMPRESSION: 1. Right IJ power port tip is in the right atrium. 2. No acute pulmonary findings. 3. Mediastinal and hilar adenopathy. Electronically Signed   By: Marijo Sanes M.D.   On: 06/02/2021 16:56  ? ?DG C-Arm 1-60 Min-No Report ? ?Result Date: 06/02/2021 ?Fluoroscopy was utilized by the requesting physician.  No radiographic interpretation.  ? ?DG C-Arm 1-60 Min-No Report ? ?Result Date: 06/02/2021 ?Fluoroscopy was utilized by the requesting physician.  No radiographic interpretation.  ? ?ECHOCARDIOGRAM COMPLETE ? ?Result Date: 06/01/2021 ?   ECHOCARDIOGRAM REPORT   Patient Name:   Barbara Rhodes [facility] Date of Exam: 06/01/2021 Medical Rec #:  [id number]                 Height:       64.0 in Accession #:    [id number]                Weight:       208.9 lb Date of Birth:  [birth year]                 BSA:          1.993 m? Patient Age:    66 years                  BP:           136/68 mmHg Patient Gender: F  HR:           89 bpm. Exam Location:  Inpatient Procedure: 2D Echo, Color Doppler, Limited Color Doppler and Strain Analysis Indications:    Chemo  History:        Patient has no prior history of Echocardiogram examinations.  Sonographer:    Joette Catching RCS Referring Phys: [place]  1. Left ventricular ejection fraction, by estimation, is 60 to 65%. Left ventricular ejection fraction by PLAX is 62 %. The left ventricle has normal function. The left ventricle has no regional wall motion abnormalities. Left ventricular diastolic parameters are consistent with Grade I diastolic dysfunction (impaired relaxation).  2. Right ventricular systolic function is hyperdynamic. The right ventricular size is normal. There is normal pulmonary artery systolic pressure. The estimated right ventricular systolic pressure is 08.6 mmHg.  3. The mitral valve is normal in structure. No evidence of mitral valve regurgitation.  4. The aortic valve is tricuspid.  Aortic valve regurgitation is not visualized.  5. The inferior vena cava is normal in size with greater than 50% respiratory variability, suggesting right atrial pressure of 3 mmHg.  6. Cannot exclude a small PFO. Atrial septum is aneruysmal. Comparison(s): No prior Echocardiogram. FINDINGS  Left Ventricle: Left ventricular ejection fraction, by estimation, is 60 to 65%. Left ventricular ejection fraction by PLAX is 62 %. The left ventricle has normal function. The left ventricle has no regional wall motion abnormalities. Global longitudinal strain performed but not reported based on interpreter judgement due to suboptimal tracking. The left ventricular internal cavity size was normal in size. There is no left ventricular hypertrophy. Left ventricular diastolic parameters are consistent with Grade I diastolic dysfunction (impaired relaxation). Indeterminate filling pressures. Right Ventricle: The right ventricular size is normal. No increase in right ventricular wall thickness. Right ventricular systolic function is hyperdynamic. There is normal pulmonary artery systolic pressure. The tricuspid regurgitant velocity is 2.65 m/s, and with an assumed right atrial pressure of 3 mmHg, the estimated right ventricular systolic pressure is 76.1 mmHg. Left Atrium: Left atrial size was normal in size. Right Atrium: Right atrial size was normal in size. Pericardium: There is no evidence of pericardial effusion. Mitral Valve: The mitral valve is normal in structure. No evidence of mitral valve regurgitation. Tricuspid Valve: The tricuspid valve is grossly normal. Tricuspid valve regurgitation is trivial. Aortic Valve: The aortic valve is tricuspid. Aortic valve regurgitation is not visualized. Aortic valve mean gradient measures 7.0 mmHg. Aortic valve peak gradient measures 11.7 mmHg. Aortic valve area, by VTI measures 2.69 cm?. Pulmonic Valve: The pulmonic valve was normal in structure. Pulmonic valve regurgitation is not  visualized. Aorta: The aortic root and ascending aorta are structurally normal, with no evidence of dilitation. Venous: The inferior vena cava is normal in size with greater than 50% respiratory variability, suggesting right atrial pressure of 3 mmHg. IAS/Shunts: The interatrial septum is aneurysmal. Cannot exclude a small PFO.  LEFT VENTRICLE PLAX 2D LV EF:         Left            Diastology                ventricular     LV e' medial:    7.07 cm/s                ejection        LV E/e' medial:  9.5  fraction by     LV e' lateral:   10.30 cm/s                PLAX is 62      LV E/e' lateral: 6.5                %. LVIDd:         5.10 cm LVIDs:         3.40 cm LV PW:         0.70 cm LV IVS:        0.90 cm LVOT diam:     2.00 cm LV SV:         78 LV SV Index:   39 LVOT Area:     3.14 cm?  LV Volumes (MOD) LV vol d, MOD    73.2 ml A2C: LV vol d, MOD    84.1 ml A4C: LV vol s, MOD    28.8 ml A2C: LV vol s, MOD    36.6 ml A4C: LV SV MOD A2C:   44.4 ml LV SV MOD A4C:   84.1 ml LV SV MOD BP:    49.7 ml RIGHT VENTRICLE             IVC RV Basal diam:  2.70 cm     IVC diam: 1.00 cm RV Mid diam:    2.20 cm RV S prime:     18.50 cm/s TAPSE (M-mode): 3.0 cm LEFT ATRIUM             Index        RIGHT ATRIUM           Index LA diam:        2.90 cm 1.46 cm/m?   RA Area:     11.40 cm? LA Vol (A2C):   43.7 ml 21.93 ml/m?  RA Volume:   22.50 ml  11.29 ml/m? LA Vol (A4C):   46.1 ml 23.13 ml/m? LA Biplane Vol: 46.6 ml 23.38 ml/m?  AORTIC VALVE                     PULMONIC VALVE AV Area (Vmax):    2.39 cm?      PV Vmax:          1.17 m/s AV Area (Vmean):   2.38 cm?      PV Peak grad:     5.5 mmHg AV Area (VTI):     2.69 cm?      PR End Diast Vel: 4.58 msec AV Vmax:           171.00 cm/s AV Vmean:          123.000 cm/s AV VTI:            0.290 m AV Peak Grad:      11.7 mmHg AV Mean Grad:      7.0 mmHg LVOT Vmax:         130.00 cm/s LVOT Vmean:        93.100 cm/s LVOT VTI:          0.248 m LVOT/AV VTI ratio: 0.86  AORTA Ao Root  diam: 3.10 cm Ao Asc diam:  3.10 cm MITRAL VALVE               TRICUSPID VALVE MV Area (PHT): 7.09 cm?    TR Peak grad:   28.1 mmHg MV Decel Time: 107 msec    TR Vmax:  265.00 cm/s MV E velocity: 67.4

## 2021-06-03 NOTE — Progress Notes (Signed)
1 Day Post-Op  ? ?Subjective/Chief Complaint: ?Complains of bruising but otherwise ok ? ? ?Objective: ?Vital signs in last 24 hours: ?Temp:  [97.2 ?F (36.2 ?C)-99.8 ?F (37.7 ?C)] 97.5 ?F (36.4 ?C) (05/13 0524) ?Pulse Rate:  [68-97] 74 (05/13 0524) ?Resp:  [14-28] 18 (05/13 0524) ?BP: (95-130)/(55-81) 101/62 (05/13 0524) ?SpO2:  [96 %-100 %] 98 % (05/13 0524) ?Weight:  [94.8 kg] 94.8 kg (05/12 1148) ?Last BM Date : 06/01/21 ? ?Intake/Output from previous day: ?05/12 0701 - 05/13 0700 ?In: 3597.5 [I.V.:2733.5; CXKGY:185; IV Piggyback:200] ?Out: 50 [Blood:50] ?Intake/Output this shift: ?No intake/output data recorded. ? ?General appearance: alert and cooperative ?Neck: port site with bruising but otherwise ok. Left neck soft and incision looks good ?Resp: clear to auscultation bilaterally ?Cardio: regular rate and rhythm ?GI: soft, nontender ? ?Lab Results:  ?Recent Labs  ?  06/02/21 ?0518 06/02/21 ?0753 06/03/21 ?6314  ?WBC 3.8*  --  4.3  ?HGB 6.7* 7.1* 9.0*  ?HCT 21.1* 22.2* 27.5*  ?PLT 120*  --  120*  ? ?BMET ?Recent Labs  ?  06/02/21 ?0518 06/03/21 ?9702  ?NA 135 140  ?K 3.3* 3.2*  ?CL 103 109  ?CO2 26 25  ?GLUCOSE 110* 152*  ?BUN 16 15  ?CREATININE 0.83 0.69  ?CALCIUM 7.9* 7.9*  ? ?PT/INR ?No results for input(s): LABPROT, INR in the last 72 hours. ?ABG ?No results for input(s): PHART, HCO3 in the last 72 hours. ? ?Invalid input(s): PCO2, PO2 ? ?Studies/Results: ?DG Chest Port 1 View ? ?Result Date: 06/02/2021 ?CLINICAL DATA:  Lymphoma.  Right Port-A-Cath placement. EXAM: PORTABLE CHEST 1 VIEW COMPARISON:  PET-CT 05/10/2021 FINDINGS: The heart is within normal limits in size. Mediastinal and hilar prominence consistent with known adenopathy. No acute pulmonary findings. Right IJ power port tip is in the right atrium, approximately 8 cm below the carina. IMPRESSION: 1. Right IJ power port tip is in the right atrium. 2. No acute pulmonary findings. 3. Mediastinal and hilar adenopathy. Electronically Signed   By: Marijo Sanes M.D.   On: 06/02/2021 16:56  ? ?DG C-Arm 1-60 Min-No Report ? ?Result Date: 06/02/2021 ?Fluoroscopy was utilized by the requesting physician.  No radiographic interpretation.  ? ?DG C-Arm 1-60 Min-No Report ? ?Result Date: 06/02/2021 ?Fluoroscopy was utilized by the requesting physician.  No radiographic interpretation.  ? ?ECHOCARDIOGRAM COMPLETE ? ?Result Date: 06/01/2021 ?   ECHOCARDIOGRAM REPORT   Patient Name:   Barbara Rhodes [facility] Date of Exam: 06/01/2021 Medical Rec #:  [id number]                 Height:       64.0 in Accession #:    [id number]                Weight:       208.9 lb Date of Birth:  1955-03-28                 BSA:          1.993 m? Patient Age:    66 years                  BP:           136/68 mmHg Patient Gender: F                         HR:           89 bpm. Exam Location:  Inpatient Procedure: 2D Echo,  Color Doppler, Limited Color Doppler and Strain Analysis Indications:    Chemo  History:        Patient has no prior history of Echocardiogram examinations.  Sonographer:    Joette Catching RCS Referring Phys: [place]  1. Left ventricular ejection fraction, by estimation, is 60 to 65%. Left ventricular ejection fraction by PLAX is 62 %. The left ventricle has normal function. The left ventricle has no regional wall motion abnormalities. Left ventricular diastolic parameters are consistent with Grade I diastolic dysfunction (impaired relaxation).  2. Right ventricular systolic function is hyperdynamic. The right ventricular size is normal. There is normal pulmonary artery systolic pressure. The estimated right ventricular systolic pressure is 27.0 mmHg.  3. The mitral valve is normal in structure. No evidence of mitral valve regurgitation.  4. The aortic valve is tricuspid. Aortic valve regurgitation is not visualized.  5. The inferior vena cava is normal in size with greater than 50% respiratory variability, suggesting right atrial pressure of 3 mmHg.  6.  Cannot exclude a small PFO. Atrial septum is aneruysmal. Comparison(s): No prior Echocardiogram. FINDINGS  Left Ventricle: Left ventricular ejection fraction, by estimation, is 60 to 65%. Left ventricular ejection fraction by PLAX is 62 %. The left ventricle has normal function. The left ventricle has no regional wall motion abnormalities. Global longitudinal strain performed but not reported based on interpreter judgement due to suboptimal tracking. The left ventricular internal cavity size was normal in size. There is no left ventricular hypertrophy. Left ventricular diastolic parameters are consistent with Grade I diastolic dysfunction (impaired relaxation). Indeterminate filling pressures. Right Ventricle: The right ventricular size is normal. No increase in right ventricular wall thickness. Right ventricular systolic function is hyperdynamic. There is normal pulmonary artery systolic pressure. The tricuspid regurgitant velocity is 2.65 m/s, and with an assumed right atrial pressure of 3 mmHg, the estimated right ventricular systolic pressure is 78.6 mmHg. Left Atrium: Left atrial size was normal in size. Right Atrium: Right atrial size was normal in size. Pericardium: There is no evidence of pericardial effusion. Mitral Valve: The mitral valve is normal in structure. No evidence of mitral valve regurgitation. Tricuspid Valve: The tricuspid valve is grossly normal. Tricuspid valve regurgitation is trivial. Aortic Valve: The aortic valve is tricuspid. Aortic valve regurgitation is not visualized. Aortic valve mean gradient measures 7.0 mmHg. Aortic valve peak gradient measures 11.7 mmHg. Aortic valve area, by VTI measures 2.69 cm?. Pulmonic Valve: The pulmonic valve was normal in structure. Pulmonic valve regurgitation is not visualized. Aorta: The aortic root and ascending aorta are structurally normal, with no evidence of dilitation. Venous: The inferior vena cava is normal in size with greater than 50%  respiratory variability, suggesting right atrial pressure of 3 mmHg. IAS/Shunts: The interatrial septum is aneurysmal. Cannot exclude a small PFO.  LEFT VENTRICLE PLAX 2D LV EF:         Left            Diastology                ventricular     LV e' medial:    7.07 cm/s                ejection        LV E/e' medial:  9.5                fraction by     LV e' lateral:   10.30 cm/s  PLAX is 62      LV E/e' lateral: 6.5                %. LVIDd:         5.10 cm LVIDs:         3.40 cm LV PW:         0.70 cm LV IVS:        0.90 cm LVOT diam:     2.00 cm LV SV:         78 LV SV Index:   39 LVOT Area:     3.14 cm?  LV Volumes (MOD) LV vol d, MOD    73.2 ml A2C: LV vol d, MOD    84.1 ml A4C: LV vol s, MOD    28.8 ml A2C: LV vol s, MOD    36.6 ml A4C: LV SV MOD A2C:   44.4 ml LV SV MOD A4C:   84.1 ml LV SV MOD BP:    49.7 ml RIGHT VENTRICLE             IVC RV Basal diam:  2.70 cm     IVC diam: 1.00 cm RV Mid diam:    2.20 cm RV S prime:     18.50 cm/s TAPSE (M-mode): 3.0 cm LEFT ATRIUM             Index        RIGHT ATRIUM           Index LA diam:        2.90 cm 1.46 cm/m?   RA Area:     11.40 cm? LA Vol (A2C):   43.7 ml 21.93 ml/m?  RA Volume:   22.50 ml  11.29 ml/m? LA Vol (A4C):   46.1 ml 23.13 ml/m? LA Biplane Vol: 46.6 ml 23.38 ml/m?  AORTIC VALVE                     PULMONIC VALVE AV Area (Vmax):    2.39 cm?      PV Vmax:          1.17 m/s AV Area (Vmean):   2.38 cm?      PV Peak grad:     5.5 mmHg AV Area (VTI):     2.69 cm?      PR End Diast Vel: 4.58 msec AV Vmax:           171.00 cm/s AV Vmean:          123.000 cm/s AV VTI:            0.290 m AV Peak Grad:      11.7 mmHg AV Mean Grad:      7.0 mmHg LVOT Vmax:         130.00 cm/s LVOT Vmean:        93.100 cm/s LVOT VTI:          0.248 m LVOT/AV VTI ratio: 0.86  AORTA Ao Root diam: 3.10 cm Ao Asc diam:  3.10 cm MITRAL VALVE               TRICUSPID VALVE MV Area (PHT): 7.09 cm?    TR Peak grad:   28.1 mmHg MV Decel Time: 107 msec    TR Vmax:        265.00  cm/s MV E velocity: 67.40 cm/s MV A velocity: 88.80 cm/s  SHUNTS MV E/A ratio:  0.76  Systemic VTI:  0.25 m                            Systemic Diam: 2.00 cm Lyman Bishop MD Electronically signed by Lyndel Safe

## 2021-06-04 DIAGNOSIS — C8590 Non-Hodgkin lymphoma, unspecified, unspecified site: Secondary | ICD-10-CM | POA: Diagnosis not present

## 2021-06-04 LAB — COMPREHENSIVE METABOLIC PANEL
ALT: 14 U/L (ref 0–44)
AST: 20 U/L (ref 15–41)
Albumin: 2.3 g/dL — ABNORMAL LOW (ref 3.5–5.0)
Alkaline Phosphatase: 67 U/L (ref 38–126)
Anion gap: 6 (ref 5–15)
BUN: 19 mg/dL (ref 8–23)
CO2: 24 mmol/L (ref 22–32)
Calcium: 7.8 mg/dL — ABNORMAL LOW (ref 8.9–10.3)
Chloride: 111 mmol/L (ref 98–111)
Creatinine, Ser: 0.64 mg/dL (ref 0.44–1.00)
GFR, Estimated: >60 mL/min (ref >60)
Glucose, Bld: 142 mg/dL — ABNORMAL HIGH (ref 70–99)
Potassium: 3.6 mmol/L (ref 3.5–5.1)
Sodium: 141 mmol/L (ref 135–145)
Total Bilirubin: 0.9 mg/dL (ref 0.3–1.2)
Total Protein: 5.1 g/dL — ABNORMAL LOW (ref 6.5–8.1)

## 2021-06-04 LAB — LACTATE DEHYDROGENASE: LDH: 208 U/L — ABNORMAL HIGH (ref 98–192)

## 2021-06-04 LAB — CBC WITH DIFFERENTIAL/PLATELET
Abs Immature Granulocytes: 0.06 10*3/uL (ref 0.00–0.07)
Basophils Absolute: 0 10*3/uL (ref 0.0–0.1)
Basophils Relative: 0 %
Eosinophils Absolute: 0 10*3/uL (ref 0.0–0.5)
Eosinophils Relative: 0 %
HCT: 26.2 % — ABNORMAL LOW (ref 36.0–46.0)
Hemoglobin: 8.7 g/dL — ABNORMAL LOW (ref 12.0–15.0)
Immature Granulocytes: 1 %
Lymphocytes Relative: 21 %
Lymphs Abs: 0.9 10*3/uL (ref 0.7–4.0)
MCH: 30.4 pg (ref 26.0–34.0)
MCHC: 33.2 g/dL (ref 30.0–36.0)
MCV: 91.6 fL (ref 80.0–100.0)
Monocytes Absolute: 0.4 10*3/uL (ref 0.1–1.0)
Monocytes Relative: 10 %
Neutro Abs: 2.9 10*3/uL (ref 1.7–7.7)
Neutrophils Relative %: 68 %
Platelets: 107 10*3/uL — ABNORMAL LOW (ref 150–400)
RBC: 2.86 MIL/uL — ABNORMAL LOW (ref 3.87–5.11)
RDW: 18.2 % — ABNORMAL HIGH (ref 11.5–15.5)
WBC: 4.2 10*3/uL (ref 4.0–10.5)
nRBC: 0 % (ref 0.0–0.2)

## 2021-06-04 LAB — PHOSPHORUS: Phosphorus: 3.8 mg/dL (ref 2.5–4.6)

## 2021-06-04 LAB — URIC ACID: Uric Acid, Serum: 2.8 mg/dL (ref 2.5–7.1)

## 2021-06-04 NOTE — Progress Notes (Signed)
2 Days Post-Op  ? ?Subjective/Chief Complaint: ?No complaints. Did well with chemo ? ? ?Objective: ?Vital signs in last 24 hours: ?Temp:  [97.7 ?F (36.5 ?C)-98.5 ?F (36.9 ?C)] 97.9 ?F (36.6 ?C) (05/14 0440) ?Pulse Rate:  [69-92] 69 (05/14 0440) ?Resp:  [16-24] 18 (05/14 0440) ?BP: (100-123)/(51-90) 100/51 (05/14 0440) ?SpO2:  [94 %-100 %] 98 % (05/14 0440) ?Last BM Date : 06/03/21 ? ?Intake/Output from previous day: ?05/13 0701 - 05/14 0700 ?In: 1980 [P.O.:1080; I.V.:900] ?Out: -  ?Intake/Output this shift: ?No intake/output data recorded. ? ?General appearance: alert and cooperative ?Neck: incision and port look good ?Resp: clear to auscultation bilaterally ?Cardio: regular rate and rhythm ?GI: soft, non-tender; bowel sounds normal; no masses,  no organomegaly ? ?Lab Results:  ?Recent Labs  ?  06/03/21 ?8341 06/04/21 ?9622  ?WBC 4.3 4.2  ?HGB 9.0* 8.7*  ?HCT 27.5* 26.2*  ?PLT 120* 107*  ? ?BMET ?Recent Labs  ?  06/03/21 ?2979 06/04/21 ?8921  ?NA 140 141  ?K 3.2* 3.6  ?CL 109 111  ?CO2 25 24  ?GLUCOSE 152* 142*  ?BUN 15 19  ?CREATININE 0.69 0.64  ?CALCIUM 7.9* 7.8*  ? ?PT/INR ?No results for input(s): LABPROT, INR in the last 72 hours. ?ABG ?No results for input(s): PHART, HCO3 in the last 72 hours. ? ?Invalid input(s): PCO2, PO2 ? ?Studies/Results: ?DG Chest Port 1 View ? ?Result Date: 06/02/2021 ?CLINICAL DATA:  Lymphoma.  Right Port-A-Cath placement. EXAM: PORTABLE CHEST 1 VIEW COMPARISON:  PET-CT 05/10/2021 FINDINGS: The heart is within normal limits in size. Mediastinal and hilar prominence consistent with known adenopathy. No acute pulmonary findings. Right IJ power port tip is in the right atrium, approximately 8 cm below the carina. IMPRESSION: 1. Right IJ power port tip is in the right atrium. 2. No acute pulmonary findings. 3. Mediastinal and hilar adenopathy. Electronically Signed   By: Marijo Sanes M.D.   On: 06/02/2021 16:56  ? ?DG C-Arm 1-60 Min-No Report ? ?Result Date: 06/02/2021 ?Fluoroscopy was  utilized by the requesting physician.  No radiographic interpretation.  ? ?DG C-Arm 1-60 Min-No Report ? ?Result Date: 06/02/2021 ?Fluoroscopy was utilized by the requesting physician.  No radiographic interpretation.  ? ?[place] EKG SITE RITE ? ?Result Date: 06/02/2021 ?If [employer] not attached, placement could not be confirmed due to current cardiac rhythm.  ? ?Anti-infectives: ?Anti-infectives (From admission, onward)  ? ? Start     Dose/Rate Route Frequency Ordered Stop  ? 06/02/21 0945  vancomycin (VANCOCIN) IVPB 1000 mg/200 mL premix       ? 1,000 mg ?200 mL/hr over 60 Minutes Intravenous On call to O.R. 06/02/21 0751 06/02/21 1521  ? 06/02/21 0600  vancomycin (VANCOCIN) IVPB 1000 mg/200 mL premix  Status:  Discontinued       ? 1,000 mg ?200 mL/hr over 60 Minutes Intravenous On call to O.R. 06/01/21 1428 06/02/21 0756  ? ?  ? ? ?Assessment/Plan: ?s/p Procedure(s): ?EXCISIONAL CERVICAL LYMPH NODE BIOPSY (Left) ?INSERTION PORT-A-CATH (Right) ?Advance diet ?Continue chemo ?Will sign off. I will see her this week as I am operating on her husband wednesday ? LOS: 3 days  ? ? ?Autumn Messing III ?06/04/2021 ? ?

## 2021-06-04 NOTE — Progress Notes (Signed)
At about 1640 after Rituxan rate was increased, patient and family called this RN to the room about 20 minutes after the increase, where I observed the patient having chills/rigors. Hypersensitivity kit was obtained and MD paged while NT obtained VS. Patient was given the required medications from the kit after conferring with the on call MD Dr Benay Spice. Patient was observed and more VS obtained until patient was more comfortable and symptoms resolved. ?

## 2021-06-04 NOTE — Progress Notes (Signed)
IP PROGRESS NOTE ? ?Subjective:  ? ?Mr: Pitz completed cycle 1 CVP-rituximab yesterday.  She developed rigors during the rituximab infusion.  No rash or other symptoms of an allergic reaction.  The rigors responded to holding rituximab and administration of Solu-Medrol, Benadryl, and Pepcid.  No nausea.  She has not noted a change in the left neck lymphadenopathy. ? ?Objective: ?Vital signs in last 24 hours: ?Blood pressure (!) 100/51, pulse 69, temperature 97.9 ?F (36.6 ?C), temperature source Oral, resp. rate 18, height '5\' 4"'$  (1.626 m), weight 208 lb 15.9 oz (94.8 kg), SpO2 98 %. ? ?Intake/Output from previous day: ?05/13 0701 - 05/14 0700 ?In: 1980 [P.O.:1080; I.V.:900] ?Out: -  ? ?Physical Exam: ? ?HEENT: No thrush ?Lungs: Clear bilaterally ?Cardiac: Regular rate and rhythm ?Abdomen: No hepatosplenomegaly ?Extremities: The right lower leg is slightly larger than the left side, no edema or tenderness or erythema ?Lymph nodes: Bulky left lower neck nodes, no axillary nodes ?Breast: Status post right mastectomy.  No evidence for chest wall tumor recurrence ?Skin: Left neck incision without evidence of infection ? ? ?Portacath/PICC-with a surrounding ecchymosis ? ?Lab Results: ?Recent Labs  ?  06/03/21 ?1829 06/04/21 ?9371  ?WBC 4.3 4.2  ?HGB 9.0* 8.7*  ?HCT 27.5* 26.2*  ?PLT 120* 107*  ? ? ?BMET ?Recent Labs  ?  06/03/21 ?6967 06/04/21 ?8938  ?NA 140 141  ?K 3.2* 3.6  ?CL 109 111  ?CO2 25 24  ?GLUCOSE 152* 142*  ?BUN 15 19  ?CREATININE 0.69 0.64  ?CALCIUM 7.9* 7.8*  ? ? ?No results found for: CEA1, CEA, [id number], CA125 ? ?Studies/Results: ?DG Chest Port 1 View ? ?Result Date: 06/02/2021 ?CLINICAL DATA:  Lymphoma.  Right Port-A-Cath placement. EXAM: PORTABLE CHEST 1 VIEW COMPARISON:  PET-CT 05/10/2021 FINDINGS: The heart is within normal limits in size. Mediastinal and hilar prominence consistent with known adenopathy. No acute pulmonary findings. Right IJ power port tip is in the right atrium, approximately 8 cm  below the carina. IMPRESSION: 1. Right IJ power port tip is in the right atrium. 2. No acute pulmonary findings. 3. Mediastinal and hilar adenopathy. Electronically Signed   By: Marijo Sanes M.D.   On: 06/02/2021 16:56  ? ?DG C-Arm 1-60 Min-No Report ? ?Result Date: 06/02/2021 ?Fluoroscopy was utilized by the requesting physician.  No radiographic interpretation.  ? ?DG C-Arm 1-60 Min-No Report ? ?Result Date: 06/02/2021 ?Fluoroscopy was utilized by the requesting physician.  No radiographic interpretation.  ? ?[place] EKG SITE RITE ? ?Result Date: 06/02/2021 ?If [employer] not attached, placement could not be confirmed due to current cardiac rhythm.  ? ?Medications: I have reviewed the patient's current medications. ? ?Assessment/Plan: ?Non-Hodgkin's lymphoma-status post cervical lymph node biopsy 06/02/2021 to clarify subtype of lymphoma ?Cycle 1 CVP-rituximab 06/03/2021 ?Anemia/thrombocytopenia secondary to #1, status post 2 units of packed red blood cells 06/02/2021 ?Remote history of breast cancer ?Port-A-Cath placement 06/02/2021 ?Rigors during the rituximab infusion 06/03/2021 ? ?Ms. Tagg completed cycle 1 CVP-rituximab yesterday.  She will begin daily prednisone today.  She tolerated the chemotherapy well aside from chills/rigors during the rituximab infusion.  She was able to complete the infusion. ? ?There is no evidence of tumor lysis syndrome.  She will continue allopurinol.  She will begin G-CSF support on 06/05/2021 or 06/06/2021 depending on the discharge time and insurance coverage. ? ?She should be stable for discharge on 06/05/2021. ? LOS: 3 days  ? ?Betsy Coder, MD  ? ?06/04/2021, 8:08 AM ?  ?

## 2021-06-05 ENCOUNTER — Encounter (HOSPITAL_COMMUNITY): Payer: Self-pay | Admitting: General Surgery

## 2021-06-05 ENCOUNTER — Telehealth: Payer: Self-pay | Admitting: Hematology

## 2021-06-05 DIAGNOSIS — C8590 Non-Hodgkin lymphoma, unspecified, unspecified site: Secondary | ICD-10-CM | POA: Diagnosis not present

## 2021-06-05 LAB — COMPREHENSIVE METABOLIC PANEL
ALT: 17 U/L (ref 0–44)
AST: 21 U/L (ref 15–41)
Albumin: 2.2 g/dL — ABNORMAL LOW (ref 3.5–5.0)
Alkaline Phosphatase: 60 U/L (ref 38–126)
Anion gap: 5 (ref 5–15)
BUN: 22 mg/dL (ref 8–23)
CO2: 23 mmol/L (ref 22–32)
Calcium: 7.6 mg/dL — ABNORMAL LOW (ref 8.9–10.3)
Chloride: 110 mmol/L (ref 98–111)
Creatinine, Ser: 0.64 mg/dL (ref 0.44–1.00)
GFR, Estimated: >60 mL/min (ref >60)
Glucose, Bld: 123 mg/dL — ABNORMAL HIGH (ref 70–99)
Potassium: 3.6 mmol/L (ref 3.5–5.1)
Sodium: 138 mmol/L (ref 135–145)
Total Bilirubin: 0.8 mg/dL (ref 0.3–1.2)
Total Protein: 4.9 g/dL — ABNORMAL LOW (ref 6.5–8.1)

## 2021-06-05 LAB — CBC WITH DIFFERENTIAL/PLATELET
Abs Immature Granulocytes: 0.02 10*3/uL (ref 0.00–0.07)
Basophils Absolute: 0 10*3/uL (ref 0.0–0.1)
Basophils Relative: 0 %
Eosinophils Absolute: 0 10*3/uL (ref 0.0–0.5)
Eosinophils Relative: 0 %
HCT: 25.4 % — ABNORMAL LOW (ref 36.0–46.0)
Hemoglobin: 8.3 g/dL — ABNORMAL LOW (ref 12.0–15.0)
Immature Granulocytes: 1 %
Lymphocytes Relative: 31 %
Lymphs Abs: 0.9 10*3/uL (ref 0.7–4.0)
MCH: 30.1 pg (ref 26.0–34.0)
MCHC: 32.7 g/dL (ref 30.0–36.0)
MCV: 92 fL (ref 80.0–100.0)
Monocytes Absolute: 0.1 10*3/uL (ref 0.1–1.0)
Monocytes Relative: 3 %
Neutro Abs: 1.9 10*3/uL (ref 1.7–7.7)
Neutrophils Relative %: 65 %
Platelets: 75 10*3/uL — ABNORMAL LOW (ref 150–400)
RBC: 2.76 MIL/uL — ABNORMAL LOW (ref 3.87–5.11)
RDW: 18.2 % — ABNORMAL HIGH (ref 11.5–15.5)
WBC: 3 10*3/uL — ABNORMAL LOW (ref 4.0–10.5)
nRBC: 0 % (ref 0.0–0.2)

## 2021-06-05 LAB — URIC ACID: Uric Acid, Serum: 3.3 mg/dL (ref 2.5–7.1)

## 2021-06-05 LAB — PHOSPHORUS: Phosphorus: 3.3 mg/dL (ref 2.5–4.6)

## 2021-06-05 LAB — LACTATE DEHYDROGENASE: LDH: 133 U/L (ref 98–192)

## 2021-06-05 MED ORDER — PANTOPRAZOLE SODIUM 40 MG PO TBEC
40.0000 mg | DELAYED_RELEASE_TABLET | Freq: Every day | ORAL | Status: DC
Start: 2021-06-05 — End: 2021-06-05

## 2021-06-05 MED ORDER — OXYCODONE HCL 5 MG PO TABS: 5.0000 mg | ORAL_TABLET | ORAL | 0 refills | Status: DC | PRN

## 2021-06-05 MED ORDER — PANTOPRAZOLE SODIUM 40 MG PO TBEC: 40.0000 mg | DELAYED_RELEASE_TABLET | Freq: Every day | ORAL | 3 refills | Status: DC

## 2021-06-05 MED ORDER — POTASSIUM CHLORIDE CRYS ER 20 MEQ PO TBCR: 20.0000 meq | EXTENDED_RELEASE_TABLET | Freq: Every day | ORAL | 0 refills | Status: DC

## 2021-06-05 MED ORDER — HEPARIN SOD (PORK) LOCK FLUSH 100 UNIT/ML IV SOLN
500.0000 [IU] | Freq: Once | INTRAVENOUS | Status: AC
  Administered 2021-06-05: 500 [IU] via INTRAVENOUS
  Filled 2021-06-05: qty 5

## 2021-06-05 MED ORDER — POLYETHYLENE GLYCOL 3350 17 G PO PACK: 17.0000 g | PACK | Freq: Every day | ORAL | 0 refills | Status: DC

## 2021-06-05 NOTE — Anesthesia Postprocedure Evaluation (Signed)
Anesthesia Post Note ? ?Patient: Nusaybah Ivie ? ?Procedure(s) Performed: EXCISIONAL CERVICAL LYMPH NODE BIOPSY (Left: Neck) ?INSERTION PORT-A-CATH (Right: Chest) ? ?  ? ?Patient location during evaluation: PACU ?Anesthesia Type: General ?Level of consciousness: awake and alert ?Pain management: pain level controlled ?Vital Signs Assessment: post-procedure vital signs reviewed and stable ?Respiratory status: spontaneous breathing, nonlabored ventilation and respiratory function stable ?Cardiovascular status: blood pressure returned to baseline and stable ?Postop Assessment: no apparent nausea or vomiting ?Anesthetic complications: no ? ? ?No notable events documented. ? ?Last Vitals:  ?Vitals:  ? 06/04/21 2029 06/05/21 0431  ?BP: (!) 117/59 114/72  ?Pulse: 79 73  ?Resp: 18 18  ?Temp: (!) 36.4 ?C 36.7 ?C  ?SpO2: 100% 99%  ?  ?Last Pain:  ?Vitals:  ? 06/05/21 0431  ?TempSrc: Oral  ?PainSc:   ? ? ?  ?  ?  ?  ?  ?  ? ?Merlinda Frederick ? ? ? ? ?

## 2021-06-05 NOTE — Discharge Instructions (Signed)
Take 100 mg of Prednisone daily for a total of 5 days. Need to take on 5/16, 5/17, and 5/18. ? ?Take Potassium chloride 20 MEq daily for 7 days. When complete, may resume over the counter potassium. ?

## 2021-06-05 NOTE — Telephone Encounter (Signed)
Scheduled appt per 5/12 staff msg. Pt is aware of appt date and time. Pt is aware to arrive 15 mins prior to appt time and to bring and updated insurance card. Pt is aware of appt location.   ?

## 2021-06-05 NOTE — Progress Notes (Signed)

## 2021-06-05 NOTE — Discharge Summary (Addendum)
Discharge Summary  ?Patient ID: ?Barbara Rhodes ?MRN: [id number] ?DOB/AGE: August 09, 1955 66 y.o. ? ?Admit date: 06/01/2021 ?Discharge date: 06/05/2021 ? ?Discharge Diagnoses:  ?Principal Problem: ?  Non-Hodgkins lymphoma ([place]) ?I saw the patient and examined her and agree with documentation as follows ? ?Discharged Condition: good ? ?Discharge Labs:  ?CBC ?   ?Component Value Date/Time  ? WBC 3.0 (L) 06/05/2021 0507  ? RBC 2.76 (L) 06/05/2021 0507  ? HGB 8.3 (L) 06/05/2021 0507  ? HCT 25.4 (L) 06/05/2021 0507  ? PLT 75 (L) 06/05/2021 0507  ? MCV 92.0 06/05/2021 0507  ? MCH 30.1 06/05/2021 0507  ? MCHC 32.7 06/05/2021 0507  ? RDW 18.2 (H) 06/05/2021 0507  ? LYMPHSABS 0.9 06/05/2021 0507  ? MONOABS 0.1 06/05/2021 0507  ? EOSABS 0.0 06/05/2021 0507  ? BASOSABS 0.0 06/05/2021 0507  ? ? ?  Latest Ref Rng & Units 06/05/2021  ?  5:07 AM 06/04/2021  ?  5:24 AM 06/03/2021  ?  5:46 AM  ?CMP  ?Glucose 70 - 99 mg/dL 123   142   152    ?BUN 8 - 23 mg/dL '22   19   15    '$ ?Creatinine 0.44 - 1.00 mg/dL 0.64   0.64   0.69    ?Sodium 135 - 145 mmol/L 138   141   140    ?Potassium 3.5 - 5.1 mmol/L 3.6   3.6   3.2    ?Chloride 98 - 111 mmol/L 110   111   109    ?CO2 22 - 32 mmol/L '23   24   25    '$ ?Calcium 8.9 - 10.3 mg/dL 7.6   7.8   7.9    ?Total Protein 6.5 - 8.1 g/dL 4.9   5.1   5.4    ?Total Bilirubin 0.3 - 1.2 mg/dL 0.8   0.9   0.7    ?Alkaline Phos 38 - 126 U/L 60   67   66    ?AST 15 - 41 U/L '21   20   14    '$ ?ALT 0 - 44 U/L '17   14   14    '$ ? ? ?Significant Diagnostic Studies:  ?06/01/2021 echocardiogram showed LVEF of 60 to 65%. ? ?Consults: general surgery ? ?Procedures:  ?06/02/2021-excisional lymph node biopsy and Port-A-Cath placement. ? ?Disposition: Discharge disposition: 01-Home or Self Care ? ? ? ? ? ?Allergies as of 06/05/2021   ? ?   Reactions  ? Penicillins Anaphylaxis  ? DID THE REACTION INVOLVE: Swelling of the face/tongue/throat, SOB, or low BP? Yes ?Sudden or severe rash/hives, skin peeling, or the inside of the  mouth or nose? No ?Did it require medical treatment? Yes ?When did it last happen?      63 years ago ?If all above answers are ?NO?, may proceed with cephalosporin use.  ? ?  ? ?  ?Medication List  ?  ? ?TAKE these medications   ? ?acetaminophen 500 MG tablet ?Commonly known as: TYLENOL ?Take 1,000 mg by mouth daily as needed for mild pain or moderate pain. ?  ?allopurinol 300 MG tablet ?Commonly known as: ZYLOPRIM ?Take 1 tablet (300 mg total) by mouth daily. ?  ?celecoxib 200 MG capsule ?Commonly known as: CELEBREX ?Restart in 5 days ?What changed:  ?how much to take ?how to take this ?when to take this ?additional instructions ?  ?docusate sodium 100 MG capsule ?Commonly known as: COLACE ?Take 100 mg by mouth daily as needed  for mild constipation. ?  ?Fish Oil Ultra 1400 MG Caps ?Take 1,400 mg by mouth 3 (three) times a week. ?  ?lidocaine-prilocaine cream ?Commonly known as: EMLA ?Apply to affected area once ?  ?LORazepam 0.5 MG tablet ?Commonly known as: Ativan ?Take 1 tablet (0.5 mg total) by mouth every 6 (six) hours as needed (Nausea or vomiting). ?  ?Magnesium 250 MG Tabs ?Take 250 mg by mouth daily. ?  ?ondansetron 8 MG tablet ?Commonly known as: Zofran ?Take 1 tablet (8 mg total) by mouth 2 (two) times daily as needed for refractory nausea / vomiting. Start on day 3 after cyclophosphamide chemotherapy. ?  ?oxyCODONE 5 MG immediate release tablet ?Commonly known as: Oxy IR/ROXICODONE ?Take 1 tablet (5 mg total) by mouth every 4 (four) hours as needed for moderate pain. ?  ?pantoprazole 40 MG tablet ?Commonly known as: PROTONIX ?Take 1 tablet (40 mg total) by mouth at bedtime. ?  ?polyethylene glycol 17 g packet ?Commonly known as: MIRALAX / GLYCOLAX ?Take 17 g by mouth daily. ?  ?Potassium 99 MG Tabs ?Take 99 mg by mouth daily. ?  ?potassium chloride SA 20 MEQ tablet ?Commonly known as: KLOR-CON M ?Take 1 tablet (20 mEq total) by mouth daily. ?  ?predniSONE 20 MG tablet ?Commonly known as: DELTASONE ?Take  5 tablets (100 mg total) by mouth daily. Take with food on days 1-5 of chemotherapy. ?  ?prochlorperazine 10 MG tablet ?Commonly known as: COMPAZINE ?Take 1 tablet (10 mg total) by mouth every 6 (six) hours as needed (Nausea or vomiting). ?  ?traMADol 50 MG tablet ?Commonly known as: ULTRAM ?Take 50 mg by mouth daily as needed for moderate pain. ?  ?triamterene-hydrochlorothiazide 37.5-25 MG tablet ?Commonly known as: MAXZIDE-25 ?Take 1 tablet by mouth daily. ?  ?Vitamin D3 50 MCG (2000 UT) capsule ?Take 4,000 Units by mouth daily. ?  ? ?  ? ?HPI: Ms. Coppa is a 66 year old female with a past medical history significant for hypertension and history of breast cancer diagnosed in 2000 status post right mastectomy, chemotherapy, radiation in [place].  She was admitted to the hospital on 06/01/2021 to initiate chemotherapy for non-Hodgkin's lymphoma.  She developed an enlarging lymph node in her left neck earlier this year and underwent ultrasound-guided biopsy on 04/03/2021 which revealed lymphoid proliferation for involvement by a B-cell lymphoproliferative process particularly large B-cell lymphoma.  PET scan showed diffuse lymphadenopathy above and below the diaphragm with marrow infiltration concerning for aggressive lymphoma.  Excisional lymph node biopsy was requested for better characterization of her lymphoma and was pending at the time of this admission. ? ?Hospital Course: The patient was admitted to the oncology unit.  She has poor venous access and consideration was given for PICC line placement.  However, she was seen by general surgery on 06/01/2021 who plan to bring her to the OR on 06/02/2021 for excisional lymph node biopsy and Port-A-Cath placement.  On the morning of 06/02/2021, her hemoglobin was found to be 6.7.  She received 2 units PRBCs.  She was able to go to the OR for the excisional lymph node biopsy and Port-A-Cath placement.  However, due to the seizure being performed late in the day,  chemotherapy was not initiated until Saturday, 06/03/2021.  She received Cytoxan, vincristine, Rituxan on 06/03/2021.  She developed chills and rigors during the infusion.  The infusion was paused and she was given additional premedications.  The Rituxan was then restarted and she was able to complete the infusion.  During  this admission, her potassium was found to be low and potassium was added to her IV fluids.  She is primarily taking Tylenol for pain, but on occasion needs oxycodone for the pain from her Port-A-Cath placement and excisional lymph node biopsy. ? ?On the morning of discharge, the patient is feeling well.  Vital signs have been reviewed and main stable.  She has no bleeding she is not having any nausea or vomiting this morning.  The patient was found to be stable for discharge to home on 06/05/2021.  She will follow-up in our office on 06/06/2021 to meet with Dr. Irene Limbo and to receive a Neulasta injection.  She was advised of her appointments.  We also reviewed continuation of prednisone for 3 additional days and how to take antiemetics.  A prescription for Protonix was sent to her pharmacy.  I also gave her a short course of oxycodone 5 mg 1 tablet p.o. every 4 hours as needed for pain.  She knows to call the office for any questions or concerns prior to her next visit with [place]. ? ?Discharge Instructions   ? ? Increase activity slowly   Complete by: As directed ?  ? No wound care   Complete by: As directed ?  ? TREATMENT CONDITIONS   Complete by: As directed ?  ? Patient should have CBC & CMP within 7 days prior to chemotherapy administration. NOTIFY MD IF: ANC < 1500, Hemoglobin < 8, PLT < 100,000,  Total Bili > 1.5, Creatinine > 1.5, ALT & AST > 80 or if patient has unstable vital signs: Temperature > 38.5, SBP > 180 or < 90, RR > 30 or HR > 100.  ? ?  ? ? ?Future Appointments  ?Date Time Provider [place]  ?06/06/2021 11:00 AM Brunetta Genera, MD [facility] None  ?06/06/2021 12:30 PM [facility] None  ? ?Signed: ?Mikey Bussing ?06/05/2021, 9:57 AM  ?

## 2021-06-06 ENCOUNTER — Inpatient Hospital Stay: Payer: BC Managed Care – PPO

## 2021-06-06 ENCOUNTER — Other Ambulatory Visit: Payer: Self-pay

## 2021-06-06 ENCOUNTER — Inpatient Hospital Stay: Payer: BC Managed Care – PPO | Admitting: Hematology

## 2021-06-06 VITALS — BP 145/73 | HR 94 | Temp 97.6°F | Resp 20 | Wt 210.9 lb

## 2021-06-06 DIAGNOSIS — C8330 Diffuse large B-cell lymphoma, unspecified site: Secondary | ICD-10-CM

## 2021-06-06 DIAGNOSIS — Z8371 Family history of colonic polyps: Secondary | ICD-10-CM | POA: Diagnosis not present

## 2021-06-06 DIAGNOSIS — Z5189 Encounter for other specified aftercare: Secondary | ICD-10-CM | POA: Diagnosis not present

## 2021-06-06 DIAGNOSIS — Z923 Personal history of irradiation: Secondary | ICD-10-CM | POA: Diagnosis not present

## 2021-06-06 DIAGNOSIS — Z803 Family history of malignant neoplasm of breast: Secondary | ICD-10-CM | POA: Diagnosis not present

## 2021-06-06 DIAGNOSIS — R590 Localized enlarged lymph nodes: Secondary | ICD-10-CM

## 2021-06-06 DIAGNOSIS — Z79899 Other long term (current) drug therapy: Secondary | ICD-10-CM | POA: Diagnosis not present

## 2021-06-06 DIAGNOSIS — C50911 Malignant neoplasm of unspecified site of right female breast: Secondary | ICD-10-CM

## 2021-06-06 DIAGNOSIS — Z853 Personal history of malignant neoplasm of breast: Secondary | ICD-10-CM | POA: Diagnosis not present

## 2021-06-06 DIAGNOSIS — Z9221 Personal history of antineoplastic chemotherapy: Secondary | ICD-10-CM | POA: Diagnosis not present

## 2021-06-06 DIAGNOSIS — Z9011 Acquired absence of right breast and nipple: Secondary | ICD-10-CM | POA: Diagnosis not present

## 2021-06-06 DIAGNOSIS — C8590 Non-Hodgkin lymphoma, unspecified, unspecified site: Secondary | ICD-10-CM | POA: Diagnosis not present

## 2021-06-06 MED ORDER — PEGFILGRASTIM-CBQV 6 MG/0.6ML ~~LOC~~ SOSY
6.0000 mg | PREFILLED_SYRINGE | Freq: Once | SUBCUTANEOUS | Status: AC
  Administered 2021-06-06: 6 mg via SUBCUTANEOUS
  Filled 2021-06-06: qty 0.6

## 2021-06-06 NOTE — Progress Notes (Signed)
Marland Kitchen   HEMATOLOGY/ONCOLOGY CONSULTATION NOTE  Date of Service: 06/06/2021  Patient Care Team: Rosine Door as PCP - General (Physician Assistant)  CHIEF COMPLAINTS/PURPOSE OF CONSULTATION:  New patient referral for continued evaluation and management of non-Hodgkin's lymphoma  HISTORY OF PRESENTING ILLNESS:   Barbara Rhodes is a wonderful 66 y.o. female who has been referred to [place] by Dr Benay Pike MD for evaluation and management of newly diagnosed non-Hodgkin's lymphoma (lymph node biopsy 06/02/2021 results pending to define subtype of lymphoma).  Patient initially presented with a palpable left cervical mass which on core needle biopsy on 04/03/2021 showed atypical lymphoid proliferation of primary large lymphoid appearing cells characterized by vesicular chromatin and prominent nucleoli.  The overall findings were limited and flow cytometry did not show obvious clonality of B cells.  Findings were limited but concerning for involvement by B-cell lymphoma particularly large B-cell lymphoma.  Patient had a PET CT scan on 05/10/2021 which showed findings of bulky lymphadenopathy in the neck chest and abdomen.  Obvious splenomegaly.  Diffuse bone marrow uptake concerning for bone marrow involvement.  Labs on 05/25/2021 showed anemia with a hemoglobin of 9.2 and mild thrombocytopenia with platelets of 127k WBC count was 4.4k  He was admitted for excisional lymph node biopsy for better characterization and consideration of for cycle of chemotherapy as inpatient. Patient had an excisional lymph node biopsy on 06/02/2021 the results of which are currently pending. Patient received cycle 1 of R-CVP on 06/03/2021 as inpatient.  She apparently had rigors/chills related to Rituxan infusion.  She has had a history of remote breast cancer 23 years ago mastectomy and has had anthracycline-based chemotherapy previously.  Total doses of previous treatment have not been  obtainable. Has reportedly had significant issues with anxiety and some depression.  Patient notes that she is clear that she would want to be treated only if her lymphoma has a good chance of being cleared up but that she would not want to continue on treatment if her lymphoma was not responding.  Patient currently notes some fatigue due to treatment.  No significant uncontrolled nausea or vomiting.  We discussed different treatment options and recommended that we pursue treatment without anthracyclines and replace her doxorubicin and the R-CHOP with etoposide.  MEDICAL HISTORY:  Past Medical History:  Diagnosis Date   Arthritis    Breast cancer ([place]) 2000   Stage 2B, ER+/PR-/Her2-   Colon polyps    Family history of adverse reaction to anesthesia 23   Brother passed away at 66 years old from malignant hyperthermia.   Family history of breast cancer    Family history of prostate cancer    Hypertension     SURGICAL HISTORY: Past Surgical History:  Procedure Laterality Date   ABDOMINAL HYSTERECTOMY     BACK SURGERY     BREAST SURGERY     COLONOSCOPY WITH PROPOFOL     pt said propofol burned her throat when pushed   IR US GUIDE BX ASP/DRAIN  04/03/2021   LYMPH NODE BIOPSY Left 06/02/2021   Procedure: EXCISIONAL CERVICAL LYMPH NODE BIOPSY;  Surgeon: Jovita Kussmaul, MD;  Location: [facility];  Service: General;  Laterality: Left;   MASTECTOMY Right 2000   MASTECTOMY Right 2000   PORTACATH PLACEMENT Right 06/02/2021   Procedure: INSERTION PORT-A-CATH;  Surgeon: Jovita Kussmaul, MD;  Location: [facility];  Service: General;  Laterality: Right;   POSTERIOR CERVICAL LAMINECTOMY Left 01/23/2018   Procedure: LEFT CERVICAL SEVEN- THORACIC ONE  LAMINOTOMY,FORAMINOTOMY, MICRODISCECTOMY;  Surgeon: Jovita Gamma, MD;  Location: [facility];  Service: Neurosurgery;  Laterality: Left;   ROTATOR CUFF REPAIR     TONSILLECTOMY     TRANSFORAMINAL LUMBAR INTERBODY FUSION (TLIF) WITH PEDICLE SCREW FIXATION 2 LEVEL  Right 10/30/2019   Procedure: Right Lumbar 3-4 Lumbar 4-5 Transforaminal lumbar interbody fusion;  Surgeon: Erline Levine, MD;  Location: [facility];  Service: Neurosurgery;  Laterality: Right;  3C/RM 21   TUBAL LIGATION     WISDOM TOOTH EXTRACTION      SOCIAL HISTORY: Social History   Socioeconomic History   Marital status: Married    Spouse name: Not on file   Number of children: Not on file   Years of education: Not on file   Highest education level: Not on file  Occupational History   Not on file  Tobacco Use   Smoking status: Never   Smokeless tobacco: Never  Vaping Use   Vaping Use: Never used  Substance and Sexual Activity   Alcohol use: No   Drug use: No   Sexual activity: Not on file  Other Topics Concern   Not on file  Social History Narrative   Not on file   Social Determinants of Health   Financial Resource Strain: Not on file  Food Insecurity: Not on file  Transportation Needs: Not on file  Physical Activity: Not on file  Stress: Not on file  Social Connections: Not on file  Intimate Partner Violence: Not on file    FAMILY HISTORY: Family History  Problem Relation Age of Onset   Breast cancer Paternal Grandmother 17   Prostate cancer Father        dx in his 56s   Colon polyps Father 7   Dementia Mother    Hypertension Mother    Dementia Maternal Aunt    Stroke Maternal Grandfather    Rheum arthritis Paternal Grandfather    Breast cancer Other        MGM's sister, post-menopausal breast cancer   Breast cancer Cousin 38       paternal 2nd cousin    ALLERGIES:  is allergic to penicillins and rituximab-pvvr.  MEDICATIONS:  Current Outpatient Medications  Medication Sig Dispense Refill   acetaminophen (TYLENOL) 500 MG tablet Take 1,000 mg by mouth daily as needed for mild pain or moderate pain.     allopurinol (ZYLOPRIM) 300 MG tablet Take 1 tablet (300 mg total) by mouth daily. 30 tablet 1   celecoxib (CELEBREX) 200 MG capsule Restart in 5 days  (Patient taking differently: Take 200 mg by mouth daily.) 1 capsule 0   Cholecalciferol (VITAMIN D3) 50 MCG (2000 UT) capsule Take 4,000 Units by mouth daily.     docusate sodium (COLACE) 100 MG capsule Take 100 mg by mouth daily as needed for mild constipation.     lidocaine-prilocaine (EMLA) cream Apply to affected area once (Patient not taking: Reported on 06/01/2021) 30 g 3   LORazepam (ATIVAN) 0.5 MG tablet Take 1 tablet (0.5 mg total) by mouth every 6 (six) hours as needed (Nausea or vomiting). (Patient not taking: Reported on 06/01/2021) 30 tablet 0   Magnesium 250 MG TABS Take 250 mg by mouth daily.     Omega-3 Fatty Acids (FISH OIL ULTRA) 1400 MG CAPS Take 1,400 mg by mouth 3 (three) times a week.     ondansetron (ZOFRAN) 8 MG tablet Take 1 tablet (8 mg total) by mouth 2 (two) times daily as needed for refractory nausea /  vomiting. Start on day 3 after cyclophosphamide chemotherapy. (Patient not taking: Reported on 06/01/2021) 30 tablet 1   oxyCODONE (OXY IR/ROXICODONE) 5 MG immediate release tablet Take 1 tablet (5 mg total) by mouth every 4 (four) hours as needed for moderate pain. 30 tablet 0   pantoprazole (PROTONIX) 40 MG tablet Take 1 tablet (40 mg total) by mouth at bedtime. 30 tablet 3   polyethylene glycol (MIRALAX / GLYCOLAX) 17 g packet Take 17 g by mouth daily. 14 each 0   Potassium 99 MG TABS Take 99 mg by mouth daily.     potassium chloride SA (KLOR-CON M) 20 MEQ tablet Take 1 tablet (20 mEq total) by mouth daily. 7 tablet 0   predniSONE (DELTASONE) 20 MG tablet Take 5 tablets (100 mg total) by mouth daily. Take with food on days 1-5 of chemotherapy. (Patient not taking: Reported on 06/01/2021) 25 tablet 5   prochlorperazine (COMPAZINE) 10 MG tablet Take 1 tablet (10 mg total) by mouth every 6 (six) hours as needed (Nausea or vomiting). (Patient not taking: Reported on 06/01/2021) 30 tablet 6   traMADol (ULTRAM) 50 MG tablet Take 50 mg by mouth daily as needed for moderate pain.      triamterene-hydrochlorothiazide (MAXZIDE-25) 37.5-25 MG tablet Take 1 tablet by mouth daily.      No current facility-administered medications for this visit.    REVIEW OF SYSTEMS:    10 Point review of Systems was done is negative except as noted above.  PHYSICAL EXAMINATION: ECOG PERFORMANCE STATUS: 1 - Symptomatic but completely ambulatory  . Vitals:   06/06/21 1100  BP: (!) 145/73  Pulse: 94  Resp: 20  Temp: 97.6 F (36.4 C)  SpO2: 99%   Filed Weights   06/06/21 1100  Weight: 210 lb 14.4 oz (95.7 kg)   .Body mass index is 36.2 kg/m.  GENERAL:alert, in no acute distress and comfortable SKIN: no acute rashes, no significant lesions EYES: conjunctiva are pink and non-injected, sclera anicteric OROPHARYNX: MMM, no exudates, no oropharyngeal erythema or ulceration NECK: supple, no JVD LYMPH:  no palpable lymphadenopathy in the cervical, axillary or inguinal regions LUNGS: clear to auscultation b/l with normal respiratory effort HEART: regular rate & rhythm ABDOMEN:  normoactive bowel sounds , non tender, not distended. Extremity: no pedal edema PSYCH: alert & oriented x 3 with fluent speech NEURO: no focal motor/sensory deficits  LABORATORY DATA:  I have reviewed the data as listed  .    Latest Ref Rng & Units 06/05/2021    5:07 AM 06/04/2021    5:24 AM 06/03/2021    5:46 AM  CBC  WBC 4.0 - 10.5 K/uL 3.0   4.2   4.3    Hemoglobin 12.0 - 15.0 g/dL 8.3   8.7   9.0    Hematocrit 36.0 - 46.0 % 25.4   26.2   27.5    Platelets 150 - 400 K/uL 75   107   120      .    Latest Ref Rng & Units 06/05/2021    5:07 AM 06/04/2021    5:24 AM 06/03/2021    5:46 AM  CMP  Glucose 70 - 99 mg/dL 123   142   152    BUN 8 - 23 mg/dL 22   19   15     Creatinine 0.44 - 1.00 mg/dL 0.64   0.64   0.69    Sodium 135 - 145 mmol/L 138   141   140  Potassium 3.5 - 5.1 mmol/L 3.6   3.6   3.2    Chloride 98 - 111 mmol/L 110   111   109    CO2 22 - 32 mmol/L 23   24   25     Calcium 8.9  - 10.3 mg/dL 7.6   7.8   7.9    Total Protein 6.5 - 8.1 g/dL 4.9   5.1   5.4    Total Bilirubin 0.3 - 1.2 mg/dL 0.8   0.9   0.7    Alkaline Phos 38 - 126 U/L 60   67   66    AST 15 - 41 U/L 21   20   14     ALT 0 - 44 U/L 17   14   14      SURGICAL PATHOLOGY  CASE: [id number]  PATIENT: Andalyn Carico  Surgical Pathology Report      Clinical History: remote history of breast cancer, now with  indeterminate left supraclavicular lymphadenopathy (cm)      FINAL MICROSCOPIC DIAGNOSIS:   A. LYMPH NODE, LEFT SUPRACLAVICULAR, NEEDLE CORE BIOPSY:  -Atypical lymphoid proliferation  -See comment   COMMENT:   The sections show several very small needle core biopsy fragments of  apparently lymph nodal tissue displaying an atypical lymphoid  proliferation of primarily large lymphoid appearing cells characterized  by vesicular chromatin and prominent nucleoli associated with scattered  mitosis.  The appearance is apparently diffuse with lack of atypical  follicles.  Flow cytometric analysis was performed [facility] [id number]) and  shows predominance of T lymphocytes with nonspecific changes.  No  monoclonal B-cell population identified.  A battery of  immunohistochemical stains was performed but the tissue is prominently  exhausted on deeper sectioning.  Scattered residual very minute  fragments show that the lymphoid appearing cells are positive for LCA,  CD20, CD79a, BCL6, MUM1, and Bcl-2.  No significant staining is seen  with CD138, CD10, CD34, cyclin D1, S100, cytokeratin AE1/AE3,  cytokeratin 8/18, or GATA3.  There is an admixed T-cell population to a  lesser extent as seen with CD3 and CD5 and there is no apparent  co-expression of CD5 in B-cell areas.  The following stains were  performed but there is no optimal tissue present on deeper sectioning  for evaluation including CD30, TdT, and in situ hybridization for kappa,  lambda and EBV.  The overall findings are limited but very  concerning  for involvement by a B-cell lymphoproliferative process particularly  large cell lymphoma.  Excisional biopsy is recommended to further  evaluate this process.   SURGICAL PATHOLOGY  CASE: [id number]  PATIENT: Berkley Harvey  Surgical Pathology Report      Clinical History: Cervical lymphocyte; history of Hodgkin's lymphoma  ([facility])    FINAL MICROSCOPIC DIAGNOSIS:   A. LYMPH NODE, LEFT NECK, EXCISION:   -  Diffuse large B-cell lymphoma, activated B-cell subtype  -  See comment   COMMENT:   The excisional biopsy consists of an enlarged lymph node with complete  effacement by a large pleomorphic population of lymphoid cells with  vesicular chromatin and prominent nucleoli.  By immunohistochemistry,  the atypical lymphocytes are positive for CD20, PAX5, Bcl-2, BCL6 and  Mum-1.  The cells are negative for CD5, CD10, CD30 and EBV by in situ  hybridization.  The proliferative rate by Ki-67 is 60-70%.  CD3  highlights background benign-appearing lymphocytes.  Overall, the  findings are consistent with a diffuse large B cell lymphoma, activated  B-cell  phenotype.  Preliminary results of this case were discussed with  Dr. Jenell Milliner on Jun 06, 2021.  FISH studies (high-grade/large B-cell  lymphoma) are pending and will be reported in an addendum. RADIOGRAPHIC STUDIES: I have personally reviewed the radiological images as listed and agreed with the findings in the report. NM PET Image Initial (PI) Skull Base To Thigh (F-18 FDG)  Result Date: 05/10/2021 CLINICAL DATA:  Initial treatment strategy for lymphoma. EXAM: NUCLEAR MEDICINE PET SKULL BASE TO THIGH TECHNIQUE: 10.4 mCi F-18 FDG was injected intravenously. Full-ring PET imaging was performed from the skull base to thigh after the radiotracer. CT data was obtained and used for attenuation correction and anatomic localization. Fasting blood glucose: 125 mg/dl COMPARISON:  Comparison made with CT of the neck of March 10, 2021. FINDINGS: Mediastinal blood pool activity: SUV max 3.10 Liver activity: SUV max 4.81 NECK: Bulky adenopathy in the LEFT neck tracking through the thoracic inlet. (Image 42/4) adenopathy is nearly confluent with the largest individual lymph node measuring 25 mm short axis previously approximately 14 mm. (Image 34/4) 20 mm lymph node in the LEFT supraclavicular region/low neck along the posterior margin of the sternocleidomastoid was previously approximately 13 mm. Enlarged lymph nodes extend along the length of the LEFT clavicle with a maximum SUV of 23.9 in this vicinity. Lymph nodes extend to the level of the hyoid in the LEFT neck and into the superior mediastinum along the LEFT neck involving posterior supraclavicular and low cervical triangle lymph nodes as well. Incidental CT findings: None CHEST: Enlarging mediastinal and axillary lymph nodes. (Image 58/4) 2 cm anterior mediastinal/prevascular lymph node with a maximum SUV of 2.6, previously less than a cm. Adjacent AP window lymph node with similar SUV values measures 16 mm short axis (image 61/4) (Image 65/4) maximum SUV of a subcarinal lymph node is 21, lymph node measuring 12 mm LEFT infrahilar/juxta aortic adenopathy largest measuring 20 mm short axis showing similar SUV values with lymph nodes tracking along the LEFT and RIGHT of the aorta into the retrocrural region and into the abdomen. No suspicious pulmonary nodule. Incidental CT findings: No consolidation or pleural effusion. Aortic atherosclerosis. No aneurysm. No pericardial fluid. Limited assessment of cardiovascular structures given lack of intravenous contrast. Signs of RIGHT mastectomy and axillary dissection without increased metabolic activity in the RIGHT axilla. ABDOMEN/PELVIS: Bulky upper abdominal and retroperitoneal adenopathy. Celiac lymph node (image 101/4) 25 mm with a maximum SUV of 22.63 Similarly FDG avid lymph nodes tracking in the intra-aortocaval groove and along the LEFT  periaortic chain largest as much as 3 cm short axis (image 123/4) slightly smaller lymph nodes in the LEFT periaortic chain, lymph nodes extending to the level of the iliac bifurcation. Paste that No solid organ involvement.  No pelvic adenopathy. Incidental CT findings: No acute findings relative to liver, gallbladder, pancreas, adrenal glands or kidneys. Spleen without increased metabolic activity beyond metabolic activity in the liver. Splenic size 11 cm. Urinary bladder is collapsed.  No acute gastrointestinal process. SKELETON: Diffuse marrow uptake of FDG involving the marrow spaces with the maximum SUV for example at the T12 level of 6.7 T8 level displaying a maximum SUV of 6.2. Bilateral iliac crest with similar increased metabolic activity relatively homogeneous marrow space activity throughout the spine and pelvis, also seen in the bilateral proximal femora and to a lesser extent in the proximal humeri. Incidental CT findings: Signs of spinal fusion spanning levels L3 through L5 with interbody cage placement. Degenerative changes throughout the spine. No  focal osseous abnormality on CT. IMPRESSION: Findings of adenopathy in the neck, chest and in the abdomen in this patient with reported history of lymphoma, categorized as Deauville criteria 5 disease. No signs of splenomegaly, top-normal size spleen with splenic activity very close to hepatic activity but not exceeding hepatic activity, attention on follow-up. Diffuse marrow uptake of FDG which in the absence of marrow stimulation is suspicious for marrow space involvement. Signs of RIGHT mastectomy and axillary dissection in this patient with history of breast cancer. Cholelithiasis. Electronically Signed   By: Zetta Bills M.D.   On: 05/10/2021 14:43   DG Chest Port 1 View  Result Date: 06/02/2021 CLINICAL DATA:  Lymphoma.  Right Port-A-Cath placement. EXAM: PORTABLE CHEST 1 VIEW COMPARISON:  PET-CT 05/10/2021 FINDINGS: The heart is within normal  limits in size. Mediastinal and hilar prominence consistent with known adenopathy. No acute pulmonary findings. Right IJ power port tip is in the right atrium, approximately 8 cm below the carina. IMPRESSION: 1. Right IJ power port tip is in the right atrium. 2. No acute pulmonary findings. 3. Mediastinal and hilar adenopathy. Electronically Signed   By: Marijo Sanes M.D.   On: 06/02/2021 16:56   DG C-Arm 1-60 Min-No Report  Result Date: 06/02/2021 Fluoroscopy was utilized by the requesting physician.  No radiographic interpretation.   DG C-Arm 1-60 Min-No Report  Result Date: 06/02/2021 Fluoroscopy was utilized by the requesting physician.  No radiographic interpretation.   ECHOCARDIOGRAM COMPLETE  Result Date: 06/01/2021    ECHOCARDIOGRAM REPORT   Patient Name:   Barbara Rhodes [facility] Date of Exam: 06/01/2021 Medical Rec #:  [id number]                 Height:       64.0 in Accession #:    [id number]                Weight:       208.9 lb Date of Birth:  1955-04-08                 BSA:          1.993 m Patient Age:    61 years                  BP:           136/68 mmHg Patient Gender: F                         HR:           89 bpm. Exam Location:  Inpatient Procedure: 2D Echo, Color Doppler, Limited Color Doppler and Strain Analysis Indications:    Chemo  History:        Patient has no prior history of Echocardiogram examinations.  Sonographer:    Joette Catching RCS Referring Phys: [place]  1. Left ventricular ejection fraction, by estimation, is 60 to 65%. Left ventricular ejection fraction by PLAX is 62 %. The left ventricle has normal function. The left ventricle has no regional wall motion abnormalities. Left ventricular diastolic parameters are consistent with Grade I diastolic dysfunction (impaired relaxation).  2. Right ventricular systolic function is hyperdynamic. The right ventricular size is normal. There is normal pulmonary artery systolic pressure. The estimated  right ventricular systolic pressure is 50.3 mmHg.  3. The mitral valve is normal in structure. No evidence of mitral valve regurgitation.  4. The aortic valve is tricuspid. Aortic valve  regurgitation is not visualized.  5. The inferior vena cava is normal in size with greater than 50% respiratory variability, suggesting right atrial pressure of 3 mmHg.  6. Cannot exclude a small PFO. Atrial septum is aneruysmal. Comparison(s): No prior Echocardiogram. FINDINGS  Left Ventricle: Left ventricular ejection fraction, by estimation, is 60 to 65%. Left ventricular ejection fraction by PLAX is 62 %. The left ventricle has normal function. The left ventricle has no regional wall motion abnormalities. Global longitudinal strain performed but not reported based on interpreter judgement due to suboptimal tracking. The left ventricular internal cavity size was normal in size. There is no left ventricular hypertrophy. Left ventricular diastolic parameters are consistent with Grade I diastolic dysfunction (impaired relaxation). Indeterminate filling pressures. Right Ventricle: The right ventricular size is normal. No increase in right ventricular wall thickness. Right ventricular systolic function is hyperdynamic. There is normal pulmonary artery systolic pressure. The tricuspid regurgitant velocity is 2.65 m/s, and with an assumed right atrial pressure of 3 mmHg, the estimated right ventricular systolic pressure is 43.1 mmHg. Left Atrium: Left atrial size was normal in size. Right Atrium: Right atrial size was normal in size. Pericardium: There is no evidence of pericardial effusion. Mitral Valve: The mitral valve is normal in structure. No evidence of mitral valve regurgitation. Tricuspid Valve: The tricuspid valve is grossly normal. Tricuspid valve regurgitation is trivial. Aortic Valve: The aortic valve is tricuspid. Aortic valve regurgitation is not visualized. Aortic valve mean gradient measures 7.0 mmHg. Aortic valve peak  gradient measures 11.7 mmHg. Aortic valve area, by VTI measures 2.69 cm. Pulmonic Valve: The pulmonic valve was normal in structure. Pulmonic valve regurgitation is not visualized. Aorta: The aortic root and ascending aorta are structurally normal, with no evidence of dilitation. Venous: The inferior vena cava is normal in size with greater than 50% respiratory variability, suggesting right atrial pressure of 3 mmHg. IAS/Shunts: The interatrial septum is aneurysmal. Cannot exclude a small PFO.  LEFT VENTRICLE PLAX 2D LV EF:         Left            Diastology                ventricular     LV e' medial:    7.07 cm/s                ejection        LV E/e' medial:  9.5                fraction by     LV e' lateral:   10.30 cm/s                PLAX is 62      LV E/e' lateral: 6.5                %. LVIDd:         5.10 cm LVIDs:         3.40 cm LV PW:         0.70 cm LV IVS:        0.90 cm LVOT diam:     2.00 cm LV SV:         78 LV SV Index:   39 LVOT Area:     3.14 cm  LV Volumes (MOD) LV vol d, MOD    73.2 ml A2C: LV vol d, MOD    84.1 ml A4C: LV vol s, MOD    28.8 ml  A2C: LV vol s, MOD    36.6 ml A4C: LV SV MOD A2C:   44.4 ml LV SV MOD A4C:   84.1 ml LV SV MOD BP:    49.7 ml RIGHT VENTRICLE             IVC RV Basal diam:  2.70 cm     IVC diam: 1.00 cm RV Mid diam:    2.20 cm RV S prime:     18.50 cm/s TAPSE (M-mode): 3.0 cm LEFT ATRIUM             Index        RIGHT ATRIUM           Index LA diam:        2.90 cm 1.46 cm/m   RA Area:     11.40 cm LA Vol (A2C):   43.7 ml 21.93 ml/m  RA Volume:   22.50 ml  11.29 ml/m LA Vol (A4C):   46.1 ml 23.13 ml/m LA Biplane Vol: 46.6 ml 23.38 ml/m  AORTIC VALVE                     PULMONIC VALVE AV Area (Vmax):    2.39 cm      PV Vmax:          1.17 m/s AV Area (Vmean):   2.38 cm      PV Peak grad:     5.5 mmHg AV Area (VTI):     2.69 cm      PR End Diast Vel: 4.58 msec AV Vmax:           171.00 cm/s AV Vmean:          123.000 cm/s AV VTI:            0.290 m AV Peak Grad:       11.7 mmHg AV Mean Grad:      7.0 mmHg LVOT Vmax:         130.00 cm/s LVOT Vmean:        93.100 cm/s LVOT VTI:          0.248 m LVOT/AV VTI ratio: 0.86  AORTA Ao Root diam: 3.10 cm Ao Asc diam:  3.10 cm MITRAL VALVE               TRICUSPID VALVE MV Area (PHT): 7.09 cm    TR Peak grad:   28.1 mmHg MV Decel Time: 107 msec    TR Vmax:        265.00 cm/s MV E velocity: 67.40 cm/s MV A velocity: 88.80 cm/s  SHUNTS MV E/A ratio:  0.76        Systemic VTI:  0.25 m                            Systemic Diam: 2.00 cm Lyman Bishop MD Electronically signed by Lyman Bishop MD Signature Date/Time: 06/01/2021/4:21:30 PM    Final    [place] EKG SITE RITE  Result Date: 06/02/2021 If Site Rite image not attached, placement could not be confirmed due to current cardiac rhythm.  [place] EKG SITE RITE  Result Date: 06/01/2021 If Site Rite image not attached, placement could not be confirmed due to current cardiac rhythm.   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, sports with previous history of right-sided breast cancer status post right mastectomy and adjuvant chemotherapy including doxorubicin now with  #1 stage III/IV  diffuse large B-cell lymphoma.-Activated B-cell subtype High risk lymphoma FISH panel pending Patient received cycle 1 of R-CVP  #2 anemia due to lymphoma involvement of bone marrow #3 status post Port-A-Cath placement #4 thrombocytopenia secondary to #1 #5 high risk of tumor lysis syndrome #6 anxiety #7 remote history of right breast cancer status postmastectomy and adjuvant chemotherapy Plan Patient's HPI was discussed and confirmed in details Labs done today were reviewed in detail Will need to get weekly port flush and labs to monitor blood counts for transfusion requirements Patient's PET scan was discussed in detail with her and so was her echocardiogram We discussed her pathology result that confirms diffuse large B-cell lymphoma We discussed that the treatment goal treatment option would be  R-CHOP but since she has probably had her lifetime limit of anthracyclines would replace her doxorubicin with etoposide.  Patient is agreeable with this Provided emotional support and counseling to address her condition and treatment related anxieties and had a detailed discussion of goals of care.  Follow-up Please schedule weekly port flush labs and appointment for 1 unit of PRBC x 4 MD visit in 2 weeks  The total time spent in the appointment was 60 minutes*.  All of the patient's questions were answered with apparent satisfaction. The patient knows to call the clinic with any problems, questions or concerns.   Sullivan Lone MD MS AAHIVMS [facility] Hematology/Oncology [facility]  .*Total Encounter Time as defined by the Centers for Medicare and Medicaid Services includes, in addition to the face-to-face time of a patient visit (documented in the note above) non-face-to-face time: obtaining and reviewing outside history, ordering and reviewing medications, tests or procedures, care coordination (communications with other health care professionals or caregivers) and documentation in the medical record.   06/06/2021 11:03 AM

## 2021-06-08 ENCOUNTER — Telehealth: Payer: Self-pay

## 2021-06-08 NOTE — Telephone Encounter (Signed)
Pt called asking about upcoming lab appointments, pt informed that she will need weekly labs, pt agreeable and had no further questions or concerns./

## 2021-06-09 ENCOUNTER — Telehealth: Payer: Self-pay | Admitting: Hematology

## 2021-06-09 ENCOUNTER — Telehealth: Payer: Self-pay

## 2021-06-09 NOTE — Telephone Encounter (Signed)
Pt called asking if she was to continue taking allopurinol or discontinue. Per Dr. Irene Limbo, pt is to continue taking til lab reassessment cycle 2, day 1. Pt verbalized understanding and knows to call back with anymore concerns or questions.

## 2021-06-09 NOTE — Telephone Encounter (Signed)
Scheduled follow-up appointment per 5/16 los. Patient is aware. 

## 2021-06-12 ENCOUNTER — Other Ambulatory Visit: Payer: Self-pay

## 2021-06-12 DIAGNOSIS — C50911 Malignant neoplasm of unspecified site of right female breast: Secondary | ICD-10-CM

## 2021-06-12 DIAGNOSIS — C8330 Diffuse large B-cell lymphoma, unspecified site: Secondary | ICD-10-CM

## 2021-06-12 DIAGNOSIS — C8590 Non-Hodgkin lymphoma, unspecified, unspecified site: Secondary | ICD-10-CM

## 2021-06-13 ENCOUNTER — Encounter: Payer: Self-pay | Admitting: Hematology

## 2021-06-13 ENCOUNTER — Other Ambulatory Visit: Payer: Self-pay

## 2021-06-13 ENCOUNTER — Inpatient Hospital Stay: Payer: BC Managed Care – PPO

## 2021-06-13 ENCOUNTER — Inpatient Hospital Stay: Payer: BC Managed Care – PPO | Attending: Hematology

## 2021-06-13 DIAGNOSIS — C8590 Non-Hodgkin lymphoma, unspecified, unspecified site: Secondary | ICD-10-CM

## 2021-06-13 DIAGNOSIS — C50911 Malignant neoplasm of unspecified site of right female breast: Secondary | ICD-10-CM

## 2021-06-13 DIAGNOSIS — C8330 Diffuse large B-cell lymphoma, unspecified site: Secondary | ICD-10-CM

## 2021-06-13 DIAGNOSIS — Z95828 Presence of other vascular implants and grafts: Secondary | ICD-10-CM

## 2021-06-13 LAB — HEPATITIS B CORE ANTIBODY, TOTAL: Hep B Core Total Ab: NONREACTIVE

## 2021-06-13 LAB — COMPREHENSIVE METABOLIC PANEL
ALT: 39 U/L (ref 0–44)
AST: 19 U/L (ref 15–41)
Albumin: 3.3 g/dL — ABNORMAL LOW (ref 3.5–5.0)
Alkaline Phosphatase: 107 U/L (ref 38–126)
Anion gap: 9 (ref 5–15)
BUN: 10 mg/dL (ref 8–23)
CO2: 27 mmol/L (ref 22–32)
Calcium: 8.5 mg/dL — ABNORMAL LOW (ref 8.9–10.3)
Chloride: 105 mmol/L (ref 98–111)
Creatinine, Ser: 0.86 mg/dL (ref 0.44–1.00)
GFR, Estimated: >60 mL/min (ref >60)
Glucose, Bld: 135 mg/dL — ABNORMAL HIGH (ref 70–99)
Potassium: 3.1 mmol/L — ABNORMAL LOW (ref 3.5–5.1)
Sodium: 141 mmol/L (ref 135–145)
Total Bilirubin: 0.5 mg/dL (ref 0.3–1.2)
Total Protein: 6.1 g/dL — ABNORMAL LOW (ref 6.5–8.1)

## 2021-06-13 LAB — SAMPLE TO BLOOD BANK

## 2021-06-13 LAB — CBC WITH DIFFERENTIAL/PLATELET
Abs Immature Granulocytes: 1.58 10*3/uL — ABNORMAL HIGH (ref 0.00–0.07)
Basophils Absolute: 0 10*3/uL (ref 0.0–0.1)
Basophils Relative: 0 %
Eosinophils Absolute: 0.1 10*3/uL (ref 0.0–0.5)
Eosinophils Relative: 1 %
HCT: 27.5 % — ABNORMAL LOW (ref 36.0–46.0)
Hemoglobin: 9 g/dL — ABNORMAL LOW (ref 12.0–15.0)
Immature Granulocytes: 10 %
Lymphocytes Relative: 11 %
Lymphs Abs: 1.8 10*3/uL (ref 0.7–4.0)
MCH: 29.5 pg (ref 26.0–34.0)
MCHC: 32.7 g/dL (ref 30.0–36.0)
MCV: 90.2 fL (ref 80.0–100.0)
Monocytes Absolute: 2.1 10*3/uL — ABNORMAL HIGH (ref 0.1–1.0)
Monocytes Relative: 13 %
Neutro Abs: 10.4 10*3/uL — ABNORMAL HIGH (ref 1.7–7.7)
Neutrophils Relative %: 65 %
Platelets: 207 10*3/uL (ref 150–400)
RBC: 3.05 MIL/uL — ABNORMAL LOW (ref 3.87–5.11)
RDW: 17.2 % — ABNORMAL HIGH (ref 11.5–15.5)
Smear Review: NORMAL
WBC: 16.1 10*3/uL — ABNORMAL HIGH (ref 4.0–10.5)
nRBC: 0.6 % — ABNORMAL HIGH (ref 0.0–0.2)

## 2021-06-13 LAB — HEPATITIS B SURFACE ANTIGEN: Hepatitis B Surface Ag: NONREACTIVE

## 2021-06-13 MED ORDER — HEPARIN SOD (PORK) LOCK FLUSH 100 UNIT/ML IV SOLN
500.0000 [IU] | Freq: Once | INTRAVENOUS | Status: AC
  Administered 2021-06-13: 500 [IU] via INTRAVENOUS

## 2021-06-13 MED ORDER — SODIUM CHLORIDE 0.9% FLUSH
10.0000 mL | INTRAVENOUS | Status: DC | PRN
  Administered 2021-06-13: 10 mL via INTRAVENOUS

## 2021-06-13 MED ORDER — SODIUM CHLORIDE 0.9 % IV SOLN: INTRAVENOUS | Status: DC

## 2021-06-13 MED ORDER — SODIUM CHLORIDE 0.9% FLUSH
10.0000 mL | INTRAVENOUS | Status: AC
  Administered 2021-06-13: 10 mL via INTRAVENOUS

## 2021-06-13 NOTE — Progress Notes (Signed)
Patient does not need blood today- her hemoglobin was 9.0. Dr. Irene Limbo aware. PAC de-accessed.

## 2021-06-13 NOTE — Addendum Note (Signed)
Addended by: Charmian Muff on: 06/13/2021 08:05 AM   Modules accepted: Orders

## 2021-06-14 ENCOUNTER — Encounter (HOSPITAL_BASED_OUTPATIENT_CLINIC_OR_DEPARTMENT_OTHER): Payer: Self-pay

## 2021-06-14 ENCOUNTER — Ambulatory Visit (HOSPITAL_BASED_OUTPATIENT_CLINIC_OR_DEPARTMENT_OTHER): Admit: 2021-06-14 | Payer: BC Managed Care – PPO | Admitting: Surgery

## 2021-06-14 SURGERY — LYMPH NODE BIOPSY
Anesthesia: General | Laterality: Left

## 2021-06-15 ENCOUNTER — Encounter (HOSPITAL_COMMUNITY): Payer: Self-pay | Admitting: Hematology

## 2021-06-16 ENCOUNTER — Other Ambulatory Visit: Payer: Self-pay | Admitting: Hematology

## 2021-06-16 LAB — SURGICAL PATHOLOGY

## 2021-06-17 ENCOUNTER — Other Ambulatory Visit: Payer: Self-pay | Admitting: Hematology and Oncology

## 2021-06-18 ENCOUNTER — Encounter: Payer: Self-pay | Admitting: Hematology and Oncology

## 2021-06-18 ENCOUNTER — Encounter: Payer: Self-pay | Admitting: Hematology

## 2021-06-20 ENCOUNTER — Other Ambulatory Visit: Payer: Self-pay

## 2021-06-20 DIAGNOSIS — C8590 Non-Hodgkin lymphoma, unspecified, unspecified site: Secondary | ICD-10-CM

## 2021-06-21 ENCOUNTER — Encounter: Payer: Self-pay | Admitting: Hematology

## 2021-06-21 ENCOUNTER — Other Ambulatory Visit: Payer: Self-pay

## 2021-06-21 DIAGNOSIS — C8590 Non-Hodgkin lymphoma, unspecified, unspecified site: Secondary | ICD-10-CM

## 2021-06-21 NOTE — Progress Notes (Signed)
Marland Kitchen   HEMATOLOGY/ONCOLOGY CLINIC NOTE  Date of Service: .06/22/2021   Patient Care Team: Rosine Door as PCP - General (Physician Assistant)  CHIEF COMPLAINTS/PURPOSE OF CONSULTATION:  Follow-up for continued evaluation and management of diffuse large B-cell lymphoma.  HISTORY OF PRESENTING ILLNESS:  Barbara Rhodes is a wonderful 66 y.o. female who has been referred to [place] by Dr Benay Pike MD for evaluation and management of newly diagnosed non-Hodgkin's lymphoma (lymph node biopsy 06/02/2021 results pending to define subtype of lymphoma).  Patient initially presented with a palpable left cervical mass which on core needle biopsy on 04/03/2021 showed atypical lymphoid proliferation of primary large lymphoid appearing cells characterized by vesicular chromatin and prominent nucleoli.  The overall findings were limited and flow cytometry did not show obvious clonality of B cells.  Findings were limited but concerning for involvement by B-cell lymphoma particularly large B-cell lymphoma.  Patient had a PET CT scan on 05/10/2021 which showed findings of bulky lymphadenopathy in the neck chest and abdomen.  Obvious splenomegaly.  Diffuse bone marrow uptake concerning for bone marrow involvement.  Labs on 05/25/2021 showed anemia with a hemoglobin of 9.2 and mild thrombocytopenia with platelets of 127k WBC count was 4.4k  He was admitted for excisional lymph node biopsy for better characterization and consideration of for cycle of chemotherapy as inpatient. Patient had an excisional lymph node biopsy on 06/02/2021 the results of which are currently pending. Patient received cycle 1 of R-CVP on 06/03/2021 as inpatient.  She apparently had rigors/chills related to Rituxan infusion.  She has had a history of remote breast cancer 23 years ago mastectomy and has had anthracycline-based chemotherapy previously.  Total doses of previous treatment have not been obtainable. Has  reportedly had significant issues with anxiety and some depression.  Patient notes that she is clear that she would want to be treated only if her lymphoma has a good chance of being cleared up but that she would not want to continue on treatment if her lymphoma was not responding.  Patient currently notes some fatigue due to treatment.  No significant uncontrolled nausea or vomiting.  We discussed different treatment options and recommended that we pursue treatment without anthracyclines and replace her doxorubicin in the R-CHOP with etoposide.  INTERVAL HISTORY:  Barbara Rhodes is a 66 y.o. female here for continued evaluation and management of of her diffuse large B-cell lymphoma . She notes that she has been doing better with improvement in her neck swelling.  No significant nausea or vomiting at this time.  Improved p.o. intake. Her blood counts are improving and she has not needed additional transfusion support. We talked about starting her on cycle 2 of R-CEOP and discussed this in detail.  She will need growth factor support and prefers to take the Menan shot herself.  Prescription has been sent to the [facility] for insurance approval.  Patient is aware that if this is not approved in time she might need the growth factor shot at the [facility]. Labs done today were discussed in detail. She notes some tingling in her fingers which was present previously as well and a little more notable with Vincristine.   MEDICAL HISTORY:  Past Medical History:  Diagnosis Date   Arthritis    Breast cancer ([place]) 2000   Stage 2B, ER+/PR-/Her2-   Colon polyps    Family history of adverse reaction to anesthesia 1959/03/08   Brother passed away at 4 years  old from malignant hyperthermia.   Family history of breast cancer    Family history of prostate cancer    Hypertension     SURGICAL HISTORY: Past Surgical History:  Procedure Laterality Date    ABDOMINAL HYSTERECTOMY     BACK SURGERY     BREAST SURGERY     COLONOSCOPY WITH PROPOFOL     pt said propofol burned her throat when pushed   IR US GUIDE BX ASP/DRAIN  04/03/2021   LYMPH NODE BIOPSY Left 06/02/2021   Procedure: EXCISIONAL CERVICAL LYMPH NODE BIOPSY;  Surgeon: Jovita Kussmaul, MD;  Location: [facility];  Service: General;  Laterality: Left;   MASTECTOMY Right 2000   MASTECTOMY Right 2000   PORTACATH PLACEMENT Right 06/02/2021   Procedure: INSERTION PORT-A-CATH;  Surgeon: Jovita Kussmaul, MD;  Location: [facility];  Service: General;  Laterality: Right;   POSTERIOR CERVICAL LAMINECTOMY Left 01/23/2018   Procedure: LEFT CERVICAL SEVEN- [place], MICRODISCECTOMY;  Surgeon: Jovita Gamma, MD;  Location: [facility];  Service: Neurosurgery;  Laterality: Left;   ROTATOR CUFF REPAIR     TONSILLECTOMY     TRANSFORAMINAL LUMBAR INTERBODY FUSION (TLIF) WITH PEDICLE SCREW FIXATION 2 LEVEL Right 10/30/2019   Procedure: Right Lumbar 3-4 Lumbar 4-5 Transforaminal lumbar interbody fusion;  Surgeon: Erline Levine, MD;  Location: [facility];  Service: Neurosurgery;  Laterality: Right;  3C/RM 21   TUBAL LIGATION     WISDOM TOOTH EXTRACTION      SOCIAL HISTORY: Social History   Socioeconomic History   Marital status: Married    Spouse name: Not on file   Number of children: Not on file   Years of education: Not on file   Highest education level: Not on file  Occupational History   Not on file  Tobacco Use   Smoking status: Never   Smokeless tobacco: Never  Vaping Use   Vaping Use: Never used  Substance and Sexual Activity   Alcohol use: No   Drug use: No   Sexual activity: Not on file  Other Topics Concern   Not on file  Social History Narrative   Not on file   Social Determinants of Health   Financial Resource Strain: Not on file  Food Insecurity: Not on file  Transportation Needs: Not on file  Physical Activity: Not on file  Stress: Not on file  Social  Connections: Not on file  Intimate Partner Violence: Not on file    FAMILY HISTORY: Family History  Problem Relation Age of Onset   Breast cancer Paternal Grandmother 6   Prostate cancer Father        dx in his 67s   Colon polyps Father 67   Dementia Mother    Hypertension Mother    Dementia Maternal Aunt    Stroke Maternal Grandfather    Rheum arthritis Paternal Grandfather    Breast cancer Other        MGM's sister, post-menopausal breast cancer   Breast cancer Cousin 59       paternal 2nd cousin    ALLERGIES:  is allergic to penicillins and rituximab-pvvr.  MEDICATIONS:  Current Outpatient Medications  Medication Sig Dispense Refill   acetaminophen (TYLENOL) 500 MG tablet Take 1,000 mg by mouth daily as needed for mild pain or moderate pain.     allopurinol (ZYLOPRIM) 300 MG tablet Take 1 tablet (300 mg total) by mouth daily. 30 tablet 1   celecoxib (CELEBREX) 200 MG capsule Restart in 5 days (Patient taking  differently: Take 200 mg by mouth daily.) 1 capsule 0   Cholecalciferol (VITAMIN D3) 50 MCG (2000 UT) capsule Take 4,000 Units by mouth daily.     docusate sodium (COLACE) 100 MG capsule Take 100 mg by mouth daily as needed for mild constipation.     lidocaine-prilocaine (EMLA) cream Apply to affected area once (Patient not taking: Reported on 06/01/2021) 30 g 3   LORazepam (ATIVAN) 0.5 MG tablet Take 1 tablet (0.5 mg total) by mouth every 6 (six) hours as needed (Nausea or vomiting). (Patient not taking: Reported on 06/01/2021) 30 tablet 0   Magnesium 250 MG TABS Take 250 mg by mouth daily.     Omega-3 Fatty Acids (FISH OIL ULTRA) 1400 MG CAPS Take 1,400 mg by mouth 3 (three) times a week.     ondansetron (ZOFRAN) 8 MG tablet Take 1 tablet (8 mg total) by mouth 2 (two) times daily as needed for refractory nausea / vomiting. Start on day 3 after cyclophosphamide chemotherapy. (Patient not taking: Reported on 06/01/2021) 30 tablet 1   oxyCODONE (OXY IR/ROXICODONE) 5 MG  immediate release tablet Take 1 tablet (5 mg total) by mouth every 4 (four) hours as needed for moderate pain. 30 tablet 0   pantoprazole (PROTONIX) 40 MG tablet Take 1 tablet (40 mg total) by mouth at bedtime. 30 tablet 3   polyethylene glycol (MIRALAX / GLYCOLAX) 17 g packet Take 17 g by mouth daily. 14 each 0   Potassium 99 MG TABS Take 99 mg by mouth daily.     potassium chloride SA (KLOR-CON M) 20 MEQ tablet Take 1 tablet (20 mEq total) by mouth daily. 7 tablet 0   predniSONE (DELTASONE) 20 MG tablet Take 5 tablets (100 mg total) by mouth daily. Take with food on days 1-5 of chemotherapy. (Patient not taking: Reported on 06/01/2021) 25 tablet 5   prochlorperazine (COMPAZINE) 10 MG tablet Take 1 tablet (10 mg total) by mouth every 6 (six) hours as needed (Nausea or vomiting). (Patient not taking: Reported on 06/01/2021) 30 tablet 6   traMADol (ULTRAM) 50 MG tablet Take 50 mg by mouth daily as needed for moderate pain.     triamterene-hydrochlorothiazide (MAXZIDE-25) 37.5-25 MG tablet Take 1 tablet by mouth daily.      No current facility-administered medications for this visit.    REVIEW OF SYSTEMS:    10 Point review of Systems was done is negative except as noted above.  PHYSICAL EXAMINATION: ECOG PERFORMANCE STATUS: 1 - Symptomatic but completely ambulatory Vital signs reviewed NAD GENERAL:alert, in no acute distress and comfortable SKIN: no acute rashes, no significant lesions EYES: conjunctiva are pink and non-injected, sclera anicteric OROPHARYNX: MMM, no exudates, no oropharyngeal erythema or ulceration NECK: supple, no JVD LYMPH:  no palpable lymphadenopathy in the cervical, axillary or inguinal regions LUNGS: clear to auscultation b/l with normal respiratory effort HEART: regular rate & rhythm ABDOMEN:  normoactive bowel sounds , non tender, not distended. Extremity: no pedal edema PSYCH: alert & oriented x 3 with fluent speech NEURO: no focal motor/sensory  deficits  LABORATORY DATA:  I have reviewed the data as listed  .    Latest Ref Rng & Units 06/22/2021    7:31 AM 06/13/2021    7:53 AM  CBC  WBC 4.0 - 10.5 K/uL 6.8   16.1    Hemoglobin 12.0 - 15.0 g/dL 9.7   9.0    Hematocrit 36.0 - 46.0 % 28.2   27.5    Platelets 150 -  400 K/uL 415   207      .    Latest Ref Rng & Units 06/22/2021    7:31 AM 06/13/2021    7:53 AM  CMP  Glucose 70 - 99 mg/dL 91   135    BUN 8 - 23 mg/dL 14   10    Creatinine 0.44 - 1.00 mg/dL 0.75   0.86    Sodium 135 - 145 mmol/L 141   141    Potassium 3.5 - 5.1 mmol/L 3.5   3.1    Chloride 98 - 111 mmol/L 106   105    CO2 22 - 32 mmol/L 29   27    Calcium 8.9 - 10.3 mg/dL 9.4   8.5    Total Protein 6.5 - 8.1 g/dL 6.3   6.1    Total Bilirubin 0.3 - 1.2 mg/dL 0.4   0.5    Alkaline Phos 38 - 126 U/L 107   107    AST 15 - 41 U/L 32   19    ALT 0 - 44 U/L 54   39     . Lab Results  Component Value Date   LDH 186 06/28/2021    SURGICAL PATHOLOGY  CASE: [id number]  PATIENT: [facility]  Surgical Pathology Report      Clinical History: remote history of breast cancer, now with  indeterminate left supraclavicular lymphadenopathy (cm)      FINAL MICROSCOPIC DIAGNOSIS:   A. LYMPH NODE, LEFT SUPRACLAVICULAR, NEEDLE CORE BIOPSY:  -Atypical lymphoid proliferation  -See comment   COMMENT:   The sections show several very small needle core biopsy fragments of  apparently lymph nodal tissue displaying an atypical lymphoid  proliferation of primarily large lymphoid appearing cells characterized  by vesicular chromatin and prominent nucleoli associated with scattered  mitosis.  The appearance is apparently diffuse with lack of atypical  follicles.  Flow cytometric analysis was performed [facility] [id number]) and  shows predominance of T lymphocytes with nonspecific changes.  No  monoclonal B-cell population identified.  A battery of  immunohistochemical stains was performed but the tissue is  prominently  exhausted on deeper sectioning.  Scattered residual very minute  fragments show that the lymphoid appearing cells are positive for LCA,  CD20, CD79a, BCL6, MUM1, and Bcl-2.  No significant staining is seen  with CD138, CD10, CD34, cyclin D1, S100, cytokeratin AE1/AE3,  cytokeratin 8/18, or GATA3.  There is an admixed T-cell population to a  lesser extent as seen with CD3 and CD5 and there is no apparent  co-expression of CD5 in B-cell areas.  The following stains were  performed but there is no optimal tissue present on deeper sectioning  for evaluation including CD30, TdT, and in situ hybridization for kappa,  lambda and EBV.  The overall findings are limited but very concerning  for involvement by a B-cell lymphoproliferative process particularly  large cell lymphoma.  Excisional biopsy is recommended to further  evaluate this process.   SURGICAL PATHOLOGY  CASE: [id number]  PATIENT: [facility]  Surgical Pathology Report      Clinical History: Cervical lymphocyte; history of Hodgkin's lymphoma  ([facility])    FINAL MICROSCOPIC DIAGNOSIS:   A. LYMPH NODE, LEFT NECK, EXCISION:   -  Diffuse large B-cell lymphoma, activated B-cell subtype  -  See comment   COMMENT:   The excisional biopsy consists of an enlarged lymph node with complete  effacement by a large pleomorphic population of lymphoid cells with  vesicular chromatin and prominent nucleoli.  By immunohistochemistry,  the atypical lymphocytes are positive for CD20, PAX5, Bcl-2, BCL6 and  Mum-1.  The cells are negative for CD5, CD10, CD30 and EBV by in situ  hybridization.  The proliferative rate by Ki-67 is 60-70%.  CD3  highlights background benign-appearing lymphocytes.  Overall, the  findings are consistent with a diffuse large B cell lymphoma, activated  B-cell phenotype.  Preliminary results of this case were discussed with  Dr. Jenell Milliner on Jun 06, 2021.  FISH studies (high-grade/large B-cell   lymphoma) are pending and will be reported in an addendum.   RADIOGRAPHIC STUDIES: I have personally reviewed the radiological images as listed and agreed with the findings in the report. DG Chest Port 1 View  Result Date: 06/02/2021 CLINICAL DATA:  Lymphoma.  Right Port-A-Cath placement. EXAM: PORTABLE CHEST 1 VIEW COMPARISON:  PET-CT 05/10/2021 FINDINGS: The heart is within normal limits in size. Mediastinal and hilar prominence consistent with known adenopathy. No acute pulmonary findings. Right IJ power port tip is in the right atrium, approximately 8 cm below the carina. IMPRESSION: 1. Right IJ power port tip is in the right atrium. 2. No acute pulmonary findings. 3. Mediastinal and hilar adenopathy. Electronically Signed   By: Marijo Sanes M.D.   On: 06/02/2021 16:56   DG C-Arm 1-60 Min-No Report  Result Date: 06/02/2021 Fluoroscopy was utilized by the requesting physician.  No radiographic interpretation.   DG C-Arm 1-60 Min-No Report  Result Date: 06/02/2021 Fluoroscopy was utilized by the requesting physician.  No radiographic interpretation.   ECHOCARDIOGRAM COMPLETE  Result Date: 06/01/2021    ECHOCARDIOGRAM REPORT   Patient Name:   Barbara Rhodes [facility] Date of Exam: 06/01/2021 Medical Rec #:  [id number]                 Height:       64.0 in Accession #:    [id number]                Weight:       208.9 lb Date of Birth:  Apr 27, 1955                 BSA:          1.993 m Patient Age:    9 years                  BP:           136/68 mmHg Patient Gender: F                         HR:           89 bpm. Exam Location:  Inpatient Procedure: 2D Echo, Color Doppler, Limited Color Doppler and Strain Analysis Indications:    Chemo  History:        Patient has no prior history of Echocardiogram examinations.  Sonographer:    Joette Catching RCS Referring Phys: [place]  1. Left ventricular ejection fraction, by estimation, is 60 to 65%. Left ventricular ejection  fraction by PLAX is 62 %. The left ventricle has normal function. The left ventricle has no regional wall motion abnormalities. Left ventricular diastolic parameters are consistent with Grade I diastolic dysfunction (impaired relaxation).  2. Right ventricular systolic function is hyperdynamic. The right ventricular size is normal. There is normal pulmonary artery systolic pressure. The estimated right ventricular systolic pressure is 65.0 mmHg.  3. The mitral  valve is normal in structure. No evidence of mitral valve regurgitation.  4. The aortic valve is tricuspid. Aortic valve regurgitation is not visualized.  5. The inferior vena cava is normal in size with greater than 50% respiratory variability, suggesting right atrial pressure of 3 mmHg.  6. Cannot exclude a small PFO. Atrial septum is aneruysmal. Comparison(s): No prior Echocardiogram. FINDINGS  Left Ventricle: Left ventricular ejection fraction, by estimation, is 60 to 65%. Left ventricular ejection fraction by PLAX is 62 %. The left ventricle has normal function. The left ventricle has no regional wall motion abnormalities. Global longitudinal strain performed but not reported based on interpreter judgement due to suboptimal tracking. The left ventricular internal cavity size was normal in size. There is no left ventricular hypertrophy. Left ventricular diastolic parameters are consistent with Grade I diastolic dysfunction (impaired relaxation). Indeterminate filling pressures. Right Ventricle: The right ventricular size is normal. No increase in right ventricular wall thickness. Right ventricular systolic function is hyperdynamic. There is normal pulmonary artery systolic pressure. The tricuspid regurgitant velocity is 2.65 m/s, and with an assumed right atrial pressure of 3 mmHg, the estimated right ventricular systolic pressure is 31.5 mmHg. Left Atrium: Left atrial size was normal in size. Right Atrium: Right atrial size was normal in size.  Pericardium: There is no evidence of pericardial effusion. Mitral Valve: The mitral valve is normal in structure. No evidence of mitral valve regurgitation. Tricuspid Valve: The tricuspid valve is grossly normal. Tricuspid valve regurgitation is trivial. Aortic Valve: The aortic valve is tricuspid. Aortic valve regurgitation is not visualized. Aortic valve mean gradient measures 7.0 mmHg. Aortic valve peak gradient measures 11.7 mmHg. Aortic valve area, by VTI measures 2.69 cm. Pulmonic Valve: The pulmonic valve was normal in structure. Pulmonic valve regurgitation is not visualized. Aorta: The aortic root and ascending aorta are structurally normal, with no evidence of dilitation. Venous: The inferior vena cava is normal in size with greater than 50% respiratory variability, suggesting right atrial pressure of 3 mmHg. IAS/Shunts: The interatrial septum is aneurysmal. Cannot exclude a small PFO.  LEFT VENTRICLE PLAX 2D LV EF:         Left            Diastology                ventricular     LV e' medial:    7.07 cm/s                ejection        LV E/e' medial:  9.5                fraction by     LV e' lateral:   10.30 cm/s                PLAX is 62      LV E/e' lateral: 6.5                %. LVIDd:         5.10 cm LVIDs:         3.40 cm LV PW:         0.70 cm LV IVS:        0.90 cm LVOT diam:     2.00 cm LV SV:         78 LV SV Index:   39 LVOT Area:     3.14 cm  LV Volumes (MOD) LV vol d, MOD    73.2 ml  A2C: LV vol d, MOD    84.1 ml A4C: LV vol s, MOD    28.8 ml A2C: LV vol s, MOD    36.6 ml A4C: LV SV MOD A2C:   44.4 ml LV SV MOD A4C:   84.1 ml LV SV MOD BP:    49.7 ml RIGHT VENTRICLE             IVC RV Basal diam:  2.70 cm     IVC diam: 1.00 cm RV Mid diam:    2.20 cm RV S prime:     18.50 cm/s TAPSE (M-mode): 3.0 cm LEFT ATRIUM             Index        RIGHT ATRIUM           Index LA diam:        2.90 cm 1.46 cm/m   RA Area:     11.40 cm LA Vol (A2C):   43.7 ml 21.93 ml/m  RA Volume:   22.50 ml  11.29  ml/m LA Vol (A4C):   46.1 ml 23.13 ml/m LA Biplane Vol: 46.6 ml 23.38 ml/m  AORTIC VALVE                     PULMONIC VALVE AV Area (Vmax):    2.39 cm      PV Vmax:          1.17 m/s AV Area (Vmean):   2.38 cm      PV Peak grad:     5.5 mmHg AV Area (VTI):     2.69 cm      PR End Diast Vel: 4.58 msec AV Vmax:           171.00 cm/s AV Vmean:          123.000 cm/s AV VTI:            0.290 m AV Peak Grad:      11.7 mmHg AV Mean Grad:      7.0 mmHg LVOT Vmax:         130.00 cm/s LVOT Vmean:        93.100 cm/s LVOT VTI:          0.248 m LVOT/AV VTI ratio: 0.86  AORTA Ao Root diam: 3.10 cm Ao Asc diam:  3.10 cm MITRAL VALVE               TRICUSPID VALVE MV Area (PHT): 7.09 cm    TR Peak grad:   28.1 mmHg MV Decel Time: 107 msec    TR Vmax:        265.00 cm/s MV E velocity: 67.40 cm/s MV A velocity: 88.80 cm/s  SHUNTS MV E/A ratio:  0.76        Systemic VTI:  0.25 m                            Systemic Diam: 2.00 cm Lyman Bishop MD Electronically signed by Lyman Bishop MD Signature Date/Time: 06/01/2021/4:21:30 PM    Final    [place] EKG SITE RITE  Result Date: 06/02/2021 If Site Rite image not attached, placement could not be confirmed due to current cardiac rhythm.  [place] EKG SITE RITE  Result Date: 06/01/2021 If Site Rite image not attached, placement could not be confirmed due to current cardiac rhythm.   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, sports with previous  history of right-sided breast cancer status post right mastectomy and adjuvant chemotherapy including doxorubicin now with  #1 stage III/IV diffuse large B-cell lymphoma.-Activated B-cell subtype High risk lymphoma FISH panel no evidence of double or triple hit lymphoma Patient received cycle 1 of R-CVP  #2 anemia due to lymphoma involvement of bone marrow #3 status post Port-A-Cath placement #4 thrombocytopenia secondary to #1 #5 high risk of tumor lysis syndrome #6 anxiety #7 remote history of right breast cancer status postmastectomy  and adjuvant chemotherapy #8 rigors with Rituxan dose being At 200 mg/h Plan Labs done today were reviewed in detail. CBC stable with improved hemoglobin to 9.7.  No indication for transfusion support at this time. CMP stable LDH within normal limits -We will schedule her to start R-CEOP as soon as possible from cycle 2 of treatment. -Patient will need Udenyca for growth factor support on day 5 which either will be administered at the cancer center or if insurance approves it she hopes to self administer this at home. -Given her previous rigors with Rituxan we shall do the Rituxan again as a first-time dose with additional premedications including addition of Demerol.  For the second cycle we will Hold Rituxan rate at 200 mg/h. -She has also been asked to premedicate with her prednisone for 2 days prior to her treatment and take the remaining prednisone for 3 days after her treatment with additional famotidine and an antihistamine to reduce risk of Rituxan related reactions. -We will need to continue monitoring blood counts weekly for transfusion requirement evaluation. -Given her neuropathy will reduce the vincristine to 1 mg cap.  Follow-up High-priority scheduling message sent  The total time spent in the appointment was 40 minutes*.  All of the patient's questions were answered with apparent satisfaction. The patient knows to call the clinic with any problems, questions or concerns.   Sullivan Lone MD MS AAHIVMS [facility] Hematology/Oncology Physician [facility]  .*Total Encounter Time as defined by the Centers for Medicare and Medicaid Services includes, in addition to the face-to-face time of a patient visit (documented in the note above) non-face-to-face time: obtaining and reviewing outside history, ordering and reviewing medications, tests or procedures, care coordination (communications with other health care professionals or caregivers) and documentation in the medical  record.  I, Melene Muller, am acting as scribe for Dr. Sullivan Lone, MD. I have reviewed the above documentation for accuracy and completeness, and I agree with the above. Brunetta Genera MD

## 2021-06-21 NOTE — Progress Notes (Signed)
Called pt to introduce myself as her Arboriculturist.  Pt has 2 insurances so copay assistance shouldn't be needed.  I informed her of the [employer] and went over what it covers.  She stated she wouldn't qualify because her husband makes a good salary but thanked me for calling.  I requested for the registration staff give her my card for any questions or concerns she may have in the future.

## 2021-06-22 ENCOUNTER — Inpatient Hospital Stay: Payer: BC Managed Care – PPO

## 2021-06-22 ENCOUNTER — Inpatient Hospital Stay: Payer: BC Managed Care – PPO | Attending: Hematology and Oncology | Admitting: Hematology

## 2021-06-22 ENCOUNTER — Encounter: Payer: Self-pay | Admitting: Hematology

## 2021-06-22 ENCOUNTER — Other Ambulatory Visit: Payer: Self-pay

## 2021-06-22 DIAGNOSIS — Z5111 Encounter for antineoplastic chemotherapy: Secondary | ICD-10-CM | POA: Diagnosis not present

## 2021-06-22 DIAGNOSIS — Z5112 Encounter for antineoplastic immunotherapy: Secondary | ICD-10-CM | POA: Insufficient documentation

## 2021-06-22 DIAGNOSIS — C8331 Diffuse large B-cell lymphoma, lymph nodes of head, face, and neck: Secondary | ICD-10-CM | POA: Insufficient documentation

## 2021-06-22 DIAGNOSIS — D649 Anemia, unspecified: Secondary | ICD-10-CM

## 2021-06-22 DIAGNOSIS — C8338 Diffuse large B-cell lymphoma, lymph nodes of multiple sites: Secondary | ICD-10-CM

## 2021-06-22 DIAGNOSIS — C8330 Diffuse large B-cell lymphoma, unspecified site: Secondary | ICD-10-CM

## 2021-06-22 DIAGNOSIS — Z95828 Presence of other vascular implants and grafts: Secondary | ICD-10-CM

## 2021-06-22 DIAGNOSIS — C8590 Non-Hodgkin lymphoma, unspecified, unspecified site: Secondary | ICD-10-CM

## 2021-06-22 LAB — CMP (CANCER CENTER ONLY)
ALT: 54 U/L — ABNORMAL HIGH (ref 0–44)
AST: 32 U/L (ref 15–41)
Albumin: 3.7 g/dL (ref 3.5–5.0)
Alkaline Phosphatase: 107 U/L (ref 38–126)
Anion gap: 6 (ref 5–15)
BUN: 14 mg/dL (ref 8–23)
CO2: 29 mmol/L (ref 22–32)
Calcium: 9.4 mg/dL (ref 8.9–10.3)
Chloride: 106 mmol/L (ref 98–111)
Creatinine: 0.75 mg/dL (ref 0.44–1.00)
GFR, Estimated: >60 mL/min (ref >60)
Glucose, Bld: 91 mg/dL (ref 70–99)
Potassium: 3.5 mmol/L (ref 3.5–5.1)
Sodium: 141 mmol/L (ref 135–145)
Total Bilirubin: 0.4 mg/dL (ref 0.3–1.2)
Total Protein: 6.3 g/dL — ABNORMAL LOW (ref 6.5–8.1)

## 2021-06-22 LAB — CBC WITH DIFFERENTIAL (CANCER CENTER ONLY)
Abs Immature Granulocytes: 0.11 10*3/uL — ABNORMAL HIGH (ref 0.00–0.07)
Basophils Absolute: 0.1 10*3/uL (ref 0.0–0.1)
Basophils Relative: 1 %
Eosinophils Absolute: 0 10*3/uL (ref 0.0–0.5)
Eosinophils Relative: 0 %
HCT: 28.2 % — ABNORMAL LOW (ref 36.0–46.0)
Hemoglobin: 9.7 g/dL — ABNORMAL LOW (ref 12.0–15.0)
Immature Granulocytes: 2 %
Lymphocytes Relative: 22 %
Lymphs Abs: 1.5 10*3/uL (ref 0.7–4.0)
MCH: 31.6 pg (ref 26.0–34.0)
MCHC: 34.4 g/dL (ref 30.0–36.0)
MCV: 91.9 fL (ref 80.0–100.0)
Monocytes Absolute: 0.8 10*3/uL (ref 0.1–1.0)
Monocytes Relative: 11 %
Neutro Abs: 4.4 10*3/uL (ref 1.7–7.7)
Neutrophils Relative %: 64 %
Platelet Count: 415 10*3/uL — ABNORMAL HIGH (ref 150–400)
RBC: 3.07 MIL/uL — ABNORMAL LOW (ref 3.87–5.11)
RDW: 22 % — ABNORMAL HIGH (ref 11.5–15.5)
WBC Count: 6.8 10*3/uL (ref 4.0–10.5)
nRBC: 0 % (ref 0.0–0.2)

## 2021-06-22 LAB — LACTATE DEHYDROGENASE: LDH: 246 U/L — ABNORMAL HIGH (ref 98–192)

## 2021-06-22 MED ORDER — SODIUM CHLORIDE 0.9% FLUSH
10.0000 mL | Freq: Once | INTRAVENOUS | Status: AC
  Administered 2021-06-22: 10 mL

## 2021-06-22 NOTE — Progress Notes (Signed)
Per Dr. Irene Limbo - patient will not require blood transfusion with Hgb of 9.7. Infusion nurse aware that patient will not require blood and is to be deaccessed.

## 2021-06-23 ENCOUNTER — Telehealth: Payer: Self-pay | Admitting: *Deleted

## 2021-06-23 ENCOUNTER — Encounter: Payer: Self-pay | Admitting: Hematology and Oncology

## 2021-06-23 ENCOUNTER — Encounter: Payer: Self-pay | Admitting: Hematology

## 2021-06-23 ENCOUNTER — Telehealth: Payer: Self-pay | Admitting: Hematology

## 2021-06-23 DIAGNOSIS — C8330 Diffuse large B-cell lymphoma, unspecified site: Secondary | ICD-10-CM | POA: Insufficient documentation

## 2021-06-23 NOTE — Telephone Encounter (Signed)
Contacted patient w/MD response to MyChart question r/t final genetics on lymphoma - "I saw the report but do not know what it really means in terms of chemo effectiveness and or prognosis. Could you clarify?"   Informed patient per Dr. Felipa Emory it is not a double hit or triple hit lymphoma, it did not change the plan. The medication changed, but not the plan. He said he will discuss with her at next appt. Patient verbalized understanding of information.  Patient asked if Dr. Irene Limbo will prescribe Ellen Henri so that she can pick up at a pharmacy and administer to self at home 3 days after last infusion in the cycle. 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 She said her insurance has approved this and informed her that MD can send RX to any pharmacy.  Dr. Irene Limbo in agreement.  [place]. Per Delsa Sale (Pharmacist), once MD send in prescription, they will verify Auth with insurance and fill for patient to pick up., Contacted patient with this information - patient verbalized understanding.

## 2021-06-23 NOTE — Telephone Encounter (Signed)
Scheduled follow-up appointments per 6/2 schedule message. Patient is aware.

## 2021-06-23 NOTE — Progress Notes (Signed)
DISCONTINUE ON PATHWAY REGIMEN - Lymphoma and CLL     A cycle is every 21 days:     Prednisone      Rituximab-xxxx      Cyclophosphamide      Doxorubicin      Vincristine   **Always confirm dose/schedule in your pharmacy ordering system**  REASON: Other Reason PRIOR TREATMENT: [id number]: R(IV)-CHOP q21 Days x 6 Cycles TREATMENT RESPONSE: Unable to Evaluate  START ON PATHWAY REGIMEN - Lymphoma and CLL     A cycle is every 21 days:     Prednisone      Rituximab-xxxx      Cyclophosphamide      Etoposide      Vincristine      Etoposide   **Always confirm dose/schedule in your pharmacy ordering system**  Patient Characteristics: Diffuse Large B-Cell Lymphoma or Follicular Lymphoma, Grade 3B, First Line, Stage III and IV Disease Type: Not Applicable Disease Type: Diffuse Large B-Cell Lymphoma Disease Type: Not Applicable Line of therapy: First Line Intent of Therapy: Curative Intent, Discussed with Patient

## 2021-06-26 ENCOUNTER — Encounter: Payer: Self-pay | Admitting: Hematology

## 2021-06-26 ENCOUNTER — Other Ambulatory Visit: Payer: Self-pay

## 2021-06-26 ENCOUNTER — Other Ambulatory Visit (HOSPITAL_COMMUNITY): Payer: Self-pay

## 2021-06-26 ENCOUNTER — Other Ambulatory Visit: Payer: Self-pay | Admitting: Hematology

## 2021-06-26 DIAGNOSIS — C8590 Non-Hodgkin lymphoma, unspecified, unspecified site: Secondary | ICD-10-CM

## 2021-06-26 MED ORDER — PEGFILGRASTIM-CBQV 6 MG/0.6ML ~~LOC~~ SOSY: PREFILLED_SYRINGE | SUBCUTANEOUS | 4 refills | Status: DC

## 2021-06-26 MED ORDER — PEGFILGRASTIM-CBQV 6 MG/0.6ML ~~LOC~~ SOSY
PREFILLED_SYRINGE | SUBCUTANEOUS | 4 refills | Status: DC
  Filled 2021-06-26: qty 0.6, fill #0

## 2021-06-28 ENCOUNTER — Other Ambulatory Visit: Payer: Self-pay

## 2021-06-28 ENCOUNTER — Inpatient Hospital Stay: Payer: BC Managed Care – PPO

## 2021-06-28 ENCOUNTER — Inpatient Hospital Stay: Payer: BC Managed Care – PPO | Attending: Hematology

## 2021-06-28 VITALS — BP 121/61 | HR 79 | Temp 98.3°F | Resp 17 | Wt 206.0 lb

## 2021-06-28 DIAGNOSIS — Z5112 Encounter for antineoplastic immunotherapy: Secondary | ICD-10-CM | POA: Insufficient documentation

## 2021-06-28 DIAGNOSIS — C8331 Diffuse large B-cell lymphoma, lymph nodes of head, face, and neck: Secondary | ICD-10-CM | POA: Diagnosis not present

## 2021-06-28 DIAGNOSIS — Z5111 Encounter for antineoplastic chemotherapy: Secondary | ICD-10-CM | POA: Diagnosis not present

## 2021-06-28 DIAGNOSIS — C8338 Diffuse large B-cell lymphoma, lymph nodes of multiple sites: Secondary | ICD-10-CM

## 2021-06-28 DIAGNOSIS — Z95828 Presence of other vascular implants and grafts: Secondary | ICD-10-CM

## 2021-06-28 DIAGNOSIS — C8590 Non-Hodgkin lymphoma, unspecified, unspecified site: Secondary | ICD-10-CM

## 2021-06-28 LAB — CBC WITH DIFFERENTIAL (CANCER CENTER ONLY)
Abs Immature Granulocytes: 0.09 10*3/uL — ABNORMAL HIGH (ref 0.00–0.07)
Basophils Absolute: 0.1 10*3/uL (ref 0.0–0.1)
Basophils Relative: 1 %
Eosinophils Absolute: 0 10*3/uL (ref 0.0–0.5)
Eosinophils Relative: 0 %
HCT: 29.7 % — ABNORMAL LOW (ref 36.0–46.0)
Hemoglobin: 10.2 g/dL — ABNORMAL LOW (ref 12.0–15.0)
Immature Granulocytes: 1 %
Lymphocytes Relative: 24 %
Lymphs Abs: 2.4 10*3/uL (ref 0.7–4.0)
MCH: 32.1 pg (ref 26.0–34.0)
MCHC: 34.3 g/dL (ref 30.0–36.0)
MCV: 93.4 fL (ref 80.0–100.0)
Monocytes Absolute: 1.2 10*3/uL — ABNORMAL HIGH (ref 0.1–1.0)
Monocytes Relative: 12 %
Neutro Abs: 6.2 10*3/uL (ref 1.7–7.7)
Neutrophils Relative %: 62 %
Platelet Count: 419 10*3/uL — ABNORMAL HIGH (ref 150–400)
RBC: 3.18 MIL/uL — ABNORMAL LOW (ref 3.87–5.11)
RDW: 22.4 % — ABNORMAL HIGH (ref 11.5–15.5)
WBC Count: 10 10*3/uL (ref 4.0–10.5)
nRBC: 0 % (ref 0.0–0.2)

## 2021-06-28 LAB — CMP (CANCER CENTER ONLY)
ALT: 26 U/L (ref 0–44)
AST: 15 U/L (ref 15–41)
Albumin: 3.9 g/dL (ref 3.5–5.0)
Alkaline Phosphatase: 78 U/L (ref 38–126)
Anion gap: 10 (ref 5–15)
BUN: 23 mg/dL (ref 8–23)
CO2: 25 mmol/L (ref 22–32)
Calcium: 9.9 mg/dL (ref 8.9–10.3)
Chloride: 106 mmol/L (ref 98–111)
Creatinine: 0.77 mg/dL (ref 0.44–1.00)
GFR, Estimated: >60 mL/min (ref >60)
Glucose, Bld: 99 mg/dL (ref 70–99)
Potassium: 3.1 mmol/L — ABNORMAL LOW (ref 3.5–5.1)
Sodium: 141 mmol/L (ref 135–145)
Total Bilirubin: 0.4 mg/dL (ref 0.3–1.2)
Total Protein: 6.6 g/dL (ref 6.5–8.1)

## 2021-06-28 LAB — LACTATE DEHYDROGENASE: LDH: 186 U/L (ref 98–192)

## 2021-06-28 LAB — SAMPLE TO BLOOD BANK

## 2021-06-28 MED ORDER — DIPHENHYDRAMINE HCL 25 MG PO CAPS
50.0000 mg | ORAL_CAPSULE | Freq: Once | ORAL | Status: AC
  Administered 2021-06-28: 50 mg via ORAL
  Filled 2021-06-28: qty 2

## 2021-06-28 MED ORDER — HEPARIN SOD (PORK) LOCK FLUSH 100 UNIT/ML IV SOLN: 500.0000 [IU] | Freq: Once | INTRAVENOUS | Status: DC | PRN

## 2021-06-28 MED ORDER — SODIUM CHLORIDE 0.9% FLUSH
10.0000 mL | Freq: Once | INTRAVENOUS | Status: AC
  Administered 2021-06-28: 10 mL

## 2021-06-28 MED ORDER — PALONOSETRON HCL INJECTION 0.25 MG/5ML
0.2500 mg | Freq: Once | INTRAVENOUS | Status: AC
  Administered 2021-06-28: 0.25 mg via INTRAVENOUS
  Filled 2021-06-28: qty 5

## 2021-06-28 MED ORDER — SODIUM CHLORIDE 0.9 % IV SOLN
50.0000 mg/m2 | Freq: Once | INTRAVENOUS | Status: AC
  Administered 2021-06-28: 100 mg via INTRAVENOUS
  Filled 2021-06-28: qty 5

## 2021-06-28 MED ORDER — FAMOTIDINE IN NACL 20-0.9 MG/50ML-% IV SOLN
20.0000 mg | Freq: Once | INTRAVENOUS | Status: AC
  Administered 2021-06-28: 20 mg via INTRAVENOUS
  Filled 2021-06-28: qty 50

## 2021-06-28 MED ORDER — ACETAMINOPHEN 325 MG PO TABS
650.0000 mg | ORAL_TABLET | Freq: Once | ORAL | Status: AC
  Administered 2021-06-28: 650 mg via ORAL
  Filled 2021-06-28: qty 2

## 2021-06-28 MED ORDER — MONTELUKAST SODIUM 10 MG PO TABS
10.0000 mg | ORAL_TABLET | Freq: Every day | ORAL | Status: DC
  Administered 2021-06-28: 10 mg via ORAL
  Filled 2021-06-28: qty 1

## 2021-06-28 MED ORDER — SODIUM CHLORIDE 0.9 % IV SOLN
375.0000 mg/m2 | Freq: Once | INTRAVENOUS | Status: AC
  Administered 2021-06-28: 800 mg via INTRAVENOUS
  Filled 2021-06-28: qty 50

## 2021-06-28 MED ORDER — SODIUM CHLORIDE 0.9 % IV SOLN
750.0000 mg/m2 | Freq: Once | INTRAVENOUS | Status: AC
  Administered 2021-06-28: 1560 mg via INTRAVENOUS
  Filled 2021-06-28: qty 78

## 2021-06-28 MED ORDER — SODIUM CHLORIDE 0.9% FLUSH: 10.0000 mL | INTRAVENOUS | Status: DC | PRN

## 2021-06-28 MED ORDER — METHYLPREDNISOLONE SODIUM SUCC 125 MG IJ SOLR
125.0000 mg | Freq: Every day | INTRAMUSCULAR | Status: DC
  Administered 2021-06-28: 125 mg via INTRAVENOUS
  Filled 2021-06-28: qty 2

## 2021-06-28 MED ORDER — MEPERIDINE HCL 25 MG/ML IJ SOLN
25.0000 mg | Freq: Once | INTRAMUSCULAR | Status: AC
  Administered 2021-06-28: 25 mg via INTRAVENOUS
  Filled 2021-06-28: qty 1

## 2021-06-28 MED ORDER — SODIUM CHLORIDE 0.9 % IV SOLN: Freq: Once | INTRAVENOUS | Status: AC

## 2021-06-28 MED ORDER — VINCRISTINE SULFATE CHEMO INJECTION 1 MG/ML
1.0000 mg | Freq: Once | INTRAVENOUS | Status: AC
  Administered 2021-06-28: 1 mg via INTRAVENOUS
  Filled 2021-06-28: qty 1

## 2021-06-28 NOTE — Addendum Note (Signed)
Addended by: Sullivan Lone on: 06/28/2021 11:33 PM   Modules accepted: Level of Service

## 2021-06-28 NOTE — Progress Notes (Addendum)
Treating as first time rituxan today d/t reaction to first dose in hospital, only going up to '200mg'$ /hr per Dr Irene Limbo.

## 2021-06-28 NOTE — Patient Instructions (Signed)
[facility] MEDICAL [facility]COLOGY  Discharge Instructions: Thank you for choosing [facility] to provide your oncology and hematology care.   If you have a lab appointment with the Cancer Center, please go directly to the Cancer Center and check in at the registration area.   Wear comfortable clothing and clothing appropriate for easy access to any Portacath or PICC line.   We strive to give you quality time with your provider. You may need to reschedule your appointment if you arrive late (15 or more minutes).  Arriving late affects you and other patients whose appointments are after yours.  Also, if you miss three or more appointments without notifying the office, you may be dismissed from the clinic at the provider's discretion.      For prescription refill requests, have your pharmacy contact our office and allow 72 hours for refills to be completed.    Today you received the following chemotherapy and/or immunotherapy agents: Vincristine, Cytoxan, Etoposide, and Rituximab      To help prevent nausea and vomiting after your treatment, we encourage you to take your nausea medication as directed.  BELOW ARE SYMPTOMS THAT SHOULD BE REPORTED IMMEDIATELY: *FEVER GREATER THAN 100.4 F (38 C) OR HIGHER *CHILLS OR SWEATING *NAUSEA AND VOMITING THAT IS NOT CONTROLLED WITH YOUR NAUSEA MEDICATION *UNUSUAL SHORTNESS OF BREATH *UNUSUAL BRUISING OR BLEEDING *URINARY PROBLEMS (pain or burning when urinating, or frequent urination) *BOWEL PROBLEMS (unusual diarrhea, constipation, pain near the anus) TENDERNESS IN MOUTH AND THROAT WITH OR WITHOUT PRESENCE OF ULCERS (sore throat, sores in mouth, or a toothache) UNUSUAL RASH, SWELLING OR PAIN  UNUSUAL VAGINAL DISCHARGE OR ITCHING   Items with * indicate a potential emergency and should be followed up as soon as possible or go to the Emergency Department if any problems should occur.  Please show the CHEMOTHERAPY ALERT CARD or  IMMUNOTHERAPY ALERT CARD at check-in to the Emergency Department and triage nurse.  Should you have questions after your visit or need to cancel or reschedule your appointment, please contact [facility]  Dept: [phone]  and follow the prompts.  Office hours are 8:00 a.m. to 4:30 p.m. Monday - Friday. Please note that voicemails left after 4:00 p.m. may not be returned until the following business day.  We are closed weekends and major holidays. You have access to a nurse at all times for urgent questions. Please call the main number to the clinic Dept: [phone] and follow the prompts.   For any non-urgent questions, you may also contact your provider using MyChart. We now offer e-Visits for anyone 18 and older to request care online for non-urgent symptoms. For details visit [email or web address].   Also download the MyChart app! Go to the app store, search "MyChart", open the app, select [facility], and log in with your MyChart username and password.  Due to Covid, a mask is required upon entering the hospital/clinic. If you do not have a mask, one will be given to you upon arrival. For doctor visits, patients may have 1 support person aged 18 or older with them. For treatment visits, patients cannot have anyone with them due to current Covid guidelines and our immunocompromised population.   Vincristine injection What is this medication? VINCRISTINE (vin KRIS teen) is a chemotherapy drug. It slows the growth of cancer cells. This medicine is used to treat many types of cancer like Hodgkin's disease, leukemia, non-Hodgkin's lymphoma, neuroblastoma (brain cancer), rhabdomyosarcoma, and Wilms' tumor. This   medicine may be used for other purposes; ask your health care provider or pharmacist if you have questions. COMMON BRAND NAME(S): Oncovin, Vincasar PFS What should I tell my care team before I take this medication? They need to know if you have any of these  conditions: blood disorders gout infection (especially chickenpox, cold sores, or herpes) kidney disease liver disease lung disease nervous system disease like Charcot-Marie-Tooth (CMT) recent or ongoing radiation therapy an unusual or allergic reaction to vincristine, other chemotherapy agents, other medicines, foods, dyes, or preservatives pregnant or trying to get pregnant breast-feeding How should I use this medication? This drug is given as an infusion into a vein. It is administered in a hospital or clinic by a specially trained health care professional. If you have pain, swelling, burning, or any unusual feeling around the site of your injection, tell your health care professional right away. Talk to your pediatrician regarding the use of this medicine in children. While this drug may be prescribed for selected conditions, precautions do apply. Overdosage: If you think you have taken too much of this medicine contact a poison control center or emergency room at once. NOTE: This medicine is only for you. Do not share this medicine with others. What if I miss a dose? It is important not to miss your dose. Call your doctor or health care professional if you are unable to keep an appointment. What may interact with this medication? certain medicines for fungal infections like itraconazole, ketoconazole, posaconazole, voriconazole certain medicines for seizures like phenytoin This list may not describe all possible interactions. Give your health care provider a list of all the medicines, herbs, non-prescription drugs, or dietary supplements you use. Also tell them if you smoke, drink alcohol, or use illegal drugs. Some items may interact with your medicine. What should I watch for while using this medication? This drug may make you feel generally unwell. This is not uncommon, as chemotherapy can affect healthy cells as well as cancer cells. Report any side effects. Continue your course of  treatment even though you feel ill unless your doctor tells you to stop. You may need blood work done while you are taking this medicine. This medicine will cause constipation. Try to have a bowel movement at least every 2 to 3 days. If you do not have a bowel movement for 3 days, call your doctor or health care professional. In some cases, you may be given additional medicines to help with side effects. Follow all directions for their use. Do not become pregnant while taking this medicine. Women should inform their doctor if they wish to become pregnant or think they might be pregnant. There is a potential for serious side effects to an unborn child. Talk to your health care professional or pharmacist for more information. Do not breast-feed an infant while taking this medicine. This medicine may make it more difficult to get pregnant or to father a child. Talk to your healthcare professional if you are concerned about your fertility. What side effects may I notice from receiving this medication? Side effects that you should report to your doctor or health care professional as soon as possible: allergic reactions like skin rash, itching or hives, swelling of the face, lips, or tongue breathing problems confusion or changes in emotions or moods constipation cough mouth sores muscle weakness nausea and vomiting pain, swelling, redness or irritation at the injection site pain, tingling, numbness in the hands or feet problems with balance, talking, walking seizures stomach pain   trouble passing urine or change in the amount of urine Side effects that usually do not require medical attention (report to your doctor or health care professional if they continue or are bothersome): diarrhea hair loss jaw pain loss of appetite This list may not describe all possible side effects. Call your doctor for medical advice about side effects. You may report side effects to FDA at 1-800-FDA-1088. Where  should I keep my medication? This drug is given in a hospital or clinic and will not be stored at home. NOTE: This sheet is a summary. It may not cover all possible information. If you have questions about this medicine, talk to your doctor, pharmacist, or health care provider.  2023 Elsevier/Gold Standard (2020-12-09 00:00:00)  Cyclophosphamide Injection What is this medication? CYCLOPHOSPHAMIDE (sye kloe FOSS fa mide) is a chemotherapy drug. It slows the growth of cancer cells. This medicine is used to treat many types of cancer like lymphoma, myeloma, leukemia, breast cancer, and ovarian cancer, to name a few. This medicine may be used for other purposes; ask your health care provider or pharmacist if you have questions. COMMON BRAND NAME(S): Cyclophosphamide, Cytoxan, Neosar What should I tell my care team before I take this medication? They need to know if you have any of these conditions: heart disease history of irregular heartbeat infection kidney disease liver disease low blood counts, like white cells, platelets, or red blood cells on hemodialysis recent or ongoing radiation therapy scarring or thickening of the lungs trouble passing urine an unusual or allergic reaction to cyclophosphamide, other medicines, foods, dyes, or preservatives pregnant or trying to get pregnant breast-feeding How should I use this medication? This drug is usually given as an injection into a vein or muscle or by infusion into a vein. It is administered in a hospital or clinic by a specially trained health care professional. Talk to your pediatrician regarding the use of this medicine in children. Special care may be needed. Overdosage: If you think you have taken too much of this medicine contact a poison control center or emergency room at once. NOTE: This medicine is only for you. Do not share this medicine with others. What if I miss a dose? It is important not to miss your dose. Call your  doctor or health care professional if you are unable to keep an appointment. What may interact with this medication? amphotericin B azathioprine certain antivirals for HIV or hepatitis certain medicines for blood pressure, heart disease, irregular heart beat certain medicines that treat or prevent blood clots like warfarin certain other medicines for cancer cyclosporine etanercept indomethacin medicines that relax muscles for surgery medicines to increase blood counts metronidazole This list may not describe all possible interactions. Give your health care provider a list of all the medicines, herbs, non-prescription drugs, or dietary supplements you use. Also tell them if you smoke, drink alcohol, or use illegal drugs. Some items may interact with your medicine. What should I watch for while using this medication? Your condition will be monitored carefully while you are receiving this medicine. You may need blood work done while you are taking this medicine. Drink water or other fluids as directed. Urinate often, even at night. Some products may contain alcohol. Ask your health care professional if this medicine contains alcohol. Be sure to tell all health care professionals you are taking this medicine. Certain medicines, like metronidazole and disulfiram, can cause an unpleasant reaction when taken with alcohol. The reaction includes flushing, headache, nausea, vomiting, sweating, and   increased thirst. The reaction can last from 30 minutes to several hours. Do not become pregnant while taking this medicine or for 1 year after stopping it. Women should inform their health care professional if they wish to become pregnant or think they might be pregnant. Men should not father a child while taking this medicine and for 4 months after stopping it. There is potential for serious side effects to an unborn child. Talk to your health care professional for more information. Do not breast-feed an  infant while taking this medicine or for 1 week after stopping it. This medicine has caused ovarian failure in some women. This medicine may make it more difficult to get pregnant. Talk to your health care professional if you are concerned about your fertility. This medicine has caused decreased sperm counts in some men. This may make it more difficult to father a child. Talk to your health care professional if you are concerned about your fertility. Call your health care professional for advice if you get a fever, chills, or sore throat, or other symptoms of a cold or flu. Do not treat yourself. This medicine decreases your body's ability to fight infections. Try to avoid being around people who are sick. Avoid taking medicines that contain aspirin, acetaminophen, ibuprofen, naproxen, or ketoprofen unless instructed by your health care professional. These medicines may hide a fever. Talk to your health care professional about your risk of cancer. You may be more at risk for certain types of cancer if you take this medicine. If you are going to need surgery or other procedure, tell your health care professional that you are using this medicine. Be careful brushing or flossing your teeth or using a toothpick because you may get an infection or bleed more easily. If you have any dental work done, tell your dentist you are receiving this medicine. What side effects may I notice from receiving this medication? Side effects that you should report to your doctor or health care professional as soon as possible: allergic reactions like skin rash, itching or hives, swelling of the face, lips, or tongue breathing problems nausea, vomiting signs and symptoms of bleeding such as bloody or black, tarry stools; red or dark brown urine; spitting up blood or brown material that looks like coffee grounds; red spots on the skin; unusual bruising or bleeding from the eyes, gums, or nose signs and symptoms of heart  failure like fast, irregular heartbeat, sudden weight gain; swelling of the ankles, feet, hands signs and symptoms of infection like fever; chills; cough; sore throat; pain or trouble passing urine signs and symptoms of kidney injury like trouble passing urine or change in the amount of urine signs and symptoms of liver injury like dark yellow or brown urine; general ill feeling or flu-like symptoms; light-colored stools; loss of appetite; nausea; right upper belly pain; unusually weak or tired; yellowing of the eyes or skin Side effects that usually do not require medical attention (report to your doctor or health care professional if they continue or are bothersome): confusion decreased hearing diarrhea facial flushing hair loss headache loss of appetite missed menstrual periods signs and symptoms of low red blood cells or anemia such as unusually weak or tired; feeling faint or lightheaded; falls skin discoloration This list may not describe all possible side effects. Call your doctor for medical advice about side effects. You may report side effects to FDA at 1-800-FDA-1088. Where should I keep my medication? This drug is given in a hospital   or clinic and will not be stored at home. NOTE: This sheet is a summary. It may not cover all possible information. If you have questions about this medicine, talk to your doctor, pharmacist, or health care provider.  2023 Elsevier/Gold Standard (2020-12-09 00:00:00) Etoposide, VP-16 injection What is this medication? ETOPOSIDE, VP-16 (e toe POE side) is a chemotherapy drug. It is used to treat testicular cancer, lung cancer, and other cancers. This medicine may be used for other purposes; ask your health care provider or pharmacist if you have questions. COMMON BRAND NAME(S): Etopophos, Toposar, VePesid What should I tell my care team before I take this medication? They need to know if you have any of these conditions: infection kidney  disease liver disease low blood counts, like low white cell, platelet, or red cell counts an unusual or allergic reaction to etoposide, other medicines, foods, dyes, or preservatives pregnant or trying to get pregnant breast-feeding How should I use this medication? This medicine is for infusion into a vein. It is administered in a hospital or clinic by a specially trained health care professional. Talk to your pediatrician regarding the use of this medicine in children. Special care may be needed. Overdosage: If you think you have taken too much of this medicine contact a poison control center or emergency room at once. NOTE: This medicine is only for you. Do not share this medicine with others. What if I miss a dose? It is important not to miss your dose. Call your doctor or health care professional if you are unable to keep an appointment. What may interact with this medication? This medicine may interact with the following medications: warfarin This list may not describe all possible interactions. Give your health care provider a list of all the medicines, herbs, non-prescription drugs, or dietary supplements you use. Also tell them if you smoke, drink alcohol, or use illegal drugs. Some items may interact with your medicine. What should I watch for while using this medication? Visit your doctor for checks on your progress. This drug may make you feel generally unwell. This is not uncommon, as chemotherapy can affect healthy cells as well as cancer cells. Report any side effects. Continue your course of treatment even though you feel ill unless your doctor tells you to stop. In some cases, you may be given additional medicines to help with side effects. Follow all directions for their use. Call your doctor or health care professional for advice if you get a fever, chills or sore throat, or other symptoms of a cold or flu. Do not treat yourself. This drug decreases your body's ability to fight  infections. Try to avoid being around people who are sick. This medicine may increase your risk to bruise or bleed. Call your doctor or health care professional if you notice any unusual bleeding. Talk to your doctor about your risk of cancer. You may be more at risk for certain types of cancers if you take this medicine. Do not become pregnant while taking this medicine or for at least 6 months after stopping it. Women should inform their doctor if they wish to become pregnant or think they might be pregnant. Women of child-bearing potential will need to have a negative pregnancy test before starting this medicine. There is a potential for serious side effects to an unborn child. Talk to your health care professional or pharmacist for more information. Do not breast-feed an infant while taking this medicine. Men must use a latex condom during sexual contact   with a woman while taking this medicine and for at least 4 months after stopping it. A latex condom is needed even if you have had a vasectomy. Contact your doctor right away if your partner becomes pregnant. Do not donate sperm while taking this medicine and for at least 4 months after you stop taking this medicine. Men should inform their doctors if they wish to father a child. This medicine may lower sperm counts. What side effects may I notice from receiving this medication? Side effects that you should report to your doctor or health care professional as soon as possible: allergic reactions like skin rash, itching or hives, swelling of the face, lips, or tongue low blood counts - this medicine may decrease the number of white blood cells, red blood cells, and platelets. You may be at increased risk for infections and bleeding nausea, vomiting redness, blistering, peeling or loosening of the skin, including inside the mouth signs and symptoms of infection like fever; chills; cough; sore throat; pain or trouble passing urine signs and symptoms of  low red blood cells or anemia such as unusually weak or tired; feeling faint or lightheaded; falls; breathing problems unusual bruising or bleeding Side effects that usually do not require medical attention (report to your doctor or health care professional if they continue or are bothersome): changes in taste diarrhea hair loss loss of appetite mouth sores This list may not describe all possible side effects. Call your doctor for medical advice about side effects. You may report side effects to FDA at 1-800-FDA-1088. Where should I keep my medication? This drug is given in a hospital or clinic and will not be stored at home. NOTE: This sheet is a summary. It may not cover all possible information. If you have questions about this medicine, talk to your doctor, pharmacist, or health care provider.  2023 Elsevier/Gold Standard (2020-12-09 00:00:00) Rituximab Injection What is this medication? RITUXIMAB (ri TUX i mab) is a monoclonal antibody. It is used to treat certain types of cancer like non-Hodgkin lymphoma and chronic lymphocytic leukemia. It is also used to treat rheumatoid arthritis, granulomatosis with polyangiitis, microscopic polyangiitis, and pemphigus vulgaris. This medicine may be used for other purposes; ask your health care provider or pharmacist if you have questions. COMMON BRAND NAME(S): RIABNI, Rituxan, RUXIENCE, truxima What should I tell my care team before I take this medication? They need to know if you have any of these conditions: chest pain heart disease infection especially a viral infection such as chickenpox, cold sores, hepatitis B, or herpes immune system problems irregular heartbeat or rhythm kidney disease low blood counts (white cells, platelets, or red cells) lung disease recent or upcoming vaccine an unusual or allergic reaction to rituximab, other medicines, foods, dyes, or preservatives pregnant or trying to get pregnant breast-feeding How should  I use this medication? This medicine is injected into a vein. It is given by a health care provider in a hospital or clinic setting. A special MedGuide will be given to you before each treatment. Be sure to read this information carefully each time. Talk to your health care provider about the use of this medicine in children. While this drug may be prescribed for children as young as 6 months for selected conditions, precautions do apply. Overdosage: If you think you have taken too much of this medicine contact a poison control center or emergency room at once. NOTE: This medicine is only for you. Do not share this medicine with others. What if   I miss a dose? Keep appointments for follow-up doses. It is important not to miss your dose. Call your health care provider if you are unable to keep an appointment. What may interact with this medication? Do not take this medicine with any of the following medicines: live vaccines This medicine may also interact with the following medicines: cisplatin This list may not describe all possible interactions. Give your health care provider a list of all the medicines, herbs, non-prescription drugs, or dietary supplements you use. Also tell them if you smoke, drink alcohol, or use illegal drugs. Some items may interact with your medicine. What should I watch for while using this medication? Your condition will be monitored carefully while you are receiving this medicine. You may need blood work done while you are taking this medicine. This medicine can cause serious infusion reactions. To reduce the risk your health care provider may give you other medicines to take before receiving this one. Be sure to follow the directions from your health care provider. This medicine may increase your risk of getting an infection. Call your health care provider for advice if you get a fever, chills, sore throat, or other symptoms of a cold or flu. Do not treat yourself. Try to  avoid being around people who are sick. Call your health care provider if you are around anyone with measles, chickenpox, or if you develop sores or blisters that do not heal properly. Avoid taking medicines that contain aspirin, acetaminophen, ibuprofen, naproxen, or ketoprofen unless instructed by your health care provider. These medicines may hide a fever. This medicine may cause serious skin reactions. They can happen weeks to months after starting the medicine. Contact your health care provider right away if you notice fevers or flu-like symptoms with a rash. The rash may be red or purple and then turn into blisters or peeling of the skin. Or, you might notice a red rash with swelling of the face, lips or lymph nodes in your neck or under your arms. In some patients, this medicine may cause a serious brain infection that may cause death. If you have any problems seeing, thinking, speaking, walking, or standing, tell your healthcare professional right away. If you cannot reach your healthcare professional, urgently seek other source of medical care. Do not become pregnant while taking this medicine or for at least 12 months after stopping it. Women should inform their health care provider if they wish to become pregnant or think they might be pregnant. There is potential for serious harm to an unborn child. Talk to your health care provider for more information. Women should use a reliable form of birth control while taking this medicine and for 12 months after stopping it. Do not breast-feed while taking this medicine or for at least 6 months after stopping it. What side effects may I notice from receiving this medication? Side effects that you should report to your health care provider as soon as possible: allergic reactions (skin rash, itching or hives; swelling of the face, lips, or tongue) diarrhea edema (sudden weight gain; swelling of the ankles, feet, hands or other unusual swelling; trouble  breathing) fast, irregular heartbeat heart attack (trouble breathing; pain or tightness in the chest, neck, back or arms; unusually weak or tired) infection (fever, chills, cough, sore throat, pain or trouble passing urine) kidney injury (trouble passing urine or change in the amount of urine) liver injury (dark yellow or brown urine; general ill feeling or flu-like symptoms; loss of appetite, right   upper belly pain; unusually weak or tired, yellowing of the eyes or skin) low blood pressure (dizziness; feeling faint or lightheaded, falls; unusually weak or tired) low red blood cell counts (trouble breathing; feeling faint; lightheaded, falls; unusually weak or tired) mouth sores redness, blistering, peeling, or loosening of the skin, including inside the mouth stomach pain unusual bruising or bleeding wheezing (trouble breathing with loud or whistling sounds) vomiting Side effects that usually do not require medical attention (report to your health care provider if they continue or are bothersome): headache joint pain muscle cramps, pain nausea This list may not describe all possible side effects. Call your doctor for medical advice about side effects. You may report side effects to FDA at 1-800-FDA-1088. Where should I keep my medication? This medicine is given in a hospital or clinic. It will not be stored at home. NOTE: This sheet is a summary. It may not cover all possible information. If you have questions about this medicine, talk to your doctor, pharmacist, or health care provider.  2023 Elsevier/Gold Standard (2020-01-11 00:00:00)  

## 2021-06-29 ENCOUNTER — Inpatient Hospital Stay: Payer: BC Managed Care – PPO

## 2021-06-29 VITALS — BP 146/70 | HR 79 | Temp 98.3°F | Resp 18

## 2021-06-29 DIAGNOSIS — C8338 Diffuse large B-cell lymphoma, lymph nodes of multiple sites: Secondary | ICD-10-CM

## 2021-06-29 DIAGNOSIS — C8331 Diffuse large B-cell lymphoma, lymph nodes of head, face, and neck: Secondary | ICD-10-CM | POA: Diagnosis not present

## 2021-06-29 DIAGNOSIS — Z5111 Encounter for antineoplastic chemotherapy: Secondary | ICD-10-CM | POA: Diagnosis not present

## 2021-06-29 DIAGNOSIS — Z5112 Encounter for antineoplastic immunotherapy: Secondary | ICD-10-CM | POA: Diagnosis not present

## 2021-06-29 MED ORDER — PROCHLORPERAZINE MALEATE 10 MG PO TABS
10.0000 mg | ORAL_TABLET | Freq: Once | ORAL | Status: AC
  Administered 2021-06-29: 10 mg via ORAL
  Filled 2021-06-29: qty 1

## 2021-06-29 MED ORDER — HEPARIN SOD (PORK) LOCK FLUSH 100 UNIT/ML IV SOLN
500.0000 [IU] | Freq: Once | INTRAVENOUS | Status: AC | PRN
  Administered 2021-06-29: 500 [IU]

## 2021-06-29 MED ORDER — SODIUM CHLORIDE 0.9 % IV SOLN: Freq: Once | INTRAVENOUS | Status: AC

## 2021-06-29 MED ORDER — SODIUM CHLORIDE 0.9 % IV SOLN
50.0000 mg/m2 | Freq: Once | INTRAVENOUS | Status: AC
  Administered 2021-06-29: 100 mg via INTRAVENOUS
  Filled 2021-06-29: qty 5

## 2021-06-29 MED ORDER — SODIUM CHLORIDE 0.9% FLUSH
10.0000 mL | INTRAVENOUS | Status: DC | PRN
  Administered 2021-06-29: 10 mL

## 2021-06-29 NOTE — Patient Instructions (Signed)
[facility]ICAL [facility]COL[facility]  Discharge Instructions: ?Thank you for choosing [facility] to provide your oncology and hematology care.  ? ?If you have a lab appointment with the Cancer Center, please go directly to the Cancer Center and check in at the registration area. ?  ?Wear comfortable clothing and clothing appropriate for easy access to any Portacath or PICC line.  ? ?We strive to give you quality time with your provider. You may need to reschedule your appointment if you arrive late (15 or more minutes).  Arriving late affects you and other patients whose appointments are after yours.  Also, if you miss three or more appointments without notifying the office, you may be dismissed from the clinic at the provider?s discretion.    ?  ?For prescription refill requests, have your pharmacy contact our office and allow 72 hours for refills to be completed.   ? ?Today you received the following chemotherapy and/or immunotherapy agents: Etoposide.     ?  ?To help prevent nausea and vomiting after your treatment, we encourage you to take your nausea medication as directed. ? ?BELOW ARE SYMPTOMS THAT SHOULD BE REPORTED IMMEDIATELY: ?*FEVER GREATER THAN 100.4 F (38 ?C) OR HIGHER ?*CHILLS OR SWEATING ?*NAUSEA AND VOMITING THAT IS NOT CONTROLLED WITH YOUR NAUSEA MEDICATION ?*UNUSUAL SHORTNESS OF BREATH ?*UNUSUAL BRUISING OR BLEEDING ?*URINARY PROBLEMS (pain or burning when urinating, or frequent urination) ?*BOWEL PROBLEMS (unusual diarrhea, constipation, pain near the anus) ?TENDERNESS IN MOUTH AND THROAT WITH OR WITHOUT PRESENCE OF ULCERS (sore throat, sores in mouth, or a toothache) ?UNUSUAL RASH, SWELLING OR PAIN  ?UNUSUAL VAGINAL DISCHARGE OR ITCHING  ? ?Items with * indicate a potential emergency and should be followed up as soon as possible or go to the Emergency Department if any problems should occur. ? ?Please show the CHEMOTHERAPY ALERT CARD or IMMUNOTHERAPY ALERT CARD at check-in  to the Emergency Department and triage nurse. ? ?Should you have questions after your visit or need to cancel or reschedule your appointment, please contact [facility]  Dept: [phone]  and follow the prompts.  Office hours are 8:00 a.m. to 4:30 p.m. Monday - Friday. Please note that voicemails left after 4:00 p.m. may not be returned until the following business day.  We are closed weekends and major holidays. You have access to a nurse at all times for urgent questions. Please call the main number to the clinic Dept: [phone] and follow the prompts. ? ? ?For any non-urgent questions, you may also contact your provider using MyChart. We now offer e-Visits for anyone 18 and older to request care online for non-urgent symptoms. For details visit [email or web address]. ?  ?Also download the MyChart app! Go to the app store, search "MyChart", open the app, select [facility], and log in with your MyChart username and password. ? ?Due to Covid, a mask is required upon entering the hospital/clinic. If you do not have a mask, one will be given to you upon arrival. For doctor visits, patients may have 1 support person aged 18 or older with them. For treatment visits, patients cannot have anyone with them due to current Covid guidelines and our immunocompromised population.  ? ?

## 2021-06-30 ENCOUNTER — Other Ambulatory Visit: Payer: Self-pay

## 2021-06-30 ENCOUNTER — Inpatient Hospital Stay: Payer: BC Managed Care – PPO

## 2021-06-30 VITALS — BP 142/73 | HR 79 | Resp 18

## 2021-06-30 DIAGNOSIS — C8338 Diffuse large B-cell lymphoma, lymph nodes of multiple sites: Secondary | ICD-10-CM

## 2021-06-30 DIAGNOSIS — Z5111 Encounter for antineoplastic chemotherapy: Secondary | ICD-10-CM | POA: Diagnosis not present

## 2021-06-30 DIAGNOSIS — Z5112 Encounter for antineoplastic immunotherapy: Secondary | ICD-10-CM | POA: Diagnosis not present

## 2021-06-30 DIAGNOSIS — C8331 Diffuse large B-cell lymphoma, lymph nodes of head, face, and neck: Secondary | ICD-10-CM | POA: Diagnosis not present

## 2021-06-30 MED ORDER — HEPARIN SOD (PORK) LOCK FLUSH 100 UNIT/ML IV SOLN
500.0000 [IU] | Freq: Once | INTRAVENOUS | Status: AC | PRN
  Administered 2021-06-30: 500 [IU]

## 2021-06-30 MED ORDER — SODIUM CHLORIDE 0.9% FLUSH
10.0000 mL | INTRAVENOUS | Status: DC | PRN
  Administered 2021-06-30: 10 mL

## 2021-06-30 MED ORDER — SODIUM CHLORIDE 0.9 % IV SOLN
50.0000 mg/m2 | Freq: Once | INTRAVENOUS | Status: AC
  Administered 2021-06-30: 100 mg via INTRAVENOUS
  Filled 2021-06-30: qty 5

## 2021-06-30 MED ORDER — SODIUM CHLORIDE 0.9 % IV SOLN: Freq: Once | INTRAVENOUS | Status: AC

## 2021-06-30 MED ORDER — PROCHLORPERAZINE MALEATE 10 MG PO TABS
10.0000 mg | ORAL_TABLET | Freq: Once | ORAL | Status: AC
  Administered 2021-06-30: 10 mg via ORAL
  Filled 2021-06-30: qty 1

## 2021-07-03 ENCOUNTER — Encounter: Payer: Self-pay | Admitting: Hematology

## 2021-07-03 ENCOUNTER — Inpatient Hospital Stay: Payer: BC Managed Care – PPO

## 2021-07-03 ENCOUNTER — Other Ambulatory Visit: Payer: Self-pay

## 2021-07-03 DIAGNOSIS — C8331 Diffuse large B-cell lymphoma, lymph nodes of head, face, and neck: Secondary | ICD-10-CM | POA: Diagnosis not present

## 2021-07-03 DIAGNOSIS — Z5111 Encounter for antineoplastic chemotherapy: Secondary | ICD-10-CM | POA: Diagnosis not present

## 2021-07-03 DIAGNOSIS — C8338 Diffuse large B-cell lymphoma, lymph nodes of multiple sites: Secondary | ICD-10-CM

## 2021-07-03 DIAGNOSIS — Z5112 Encounter for antineoplastic immunotherapy: Secondary | ICD-10-CM | POA: Diagnosis not present

## 2021-07-03 DIAGNOSIS — Z95828 Presence of other vascular implants and grafts: Secondary | ICD-10-CM

## 2021-07-03 LAB — CBC WITH DIFFERENTIAL (CANCER CENTER ONLY)
Abs Immature Granulocytes: 0.98 10*3/uL — ABNORMAL HIGH (ref 0.00–0.07)
Basophils Absolute: 0.1 10*3/uL (ref 0.0–0.1)
Basophils Relative: 0 %
Eosinophils Absolute: 0.4 10*3/uL (ref 0.0–0.5)
Eosinophils Relative: 1 %
HCT: 29.9 % — ABNORMAL LOW (ref 36.0–46.0)
Hemoglobin: 10.3 g/dL — ABNORMAL LOW (ref 12.0–15.0)
Immature Granulocytes: 3 %
Lymphocytes Relative: 3 %
Lymphs Abs: 1 10*3/uL (ref 0.7–4.0)
MCH: 32.4 pg (ref 26.0–34.0)
MCHC: 34.4 g/dL (ref 30.0–36.0)
MCV: 94 fL (ref 80.0–100.0)
Monocytes Absolute: 0.2 10*3/uL (ref 0.1–1.0)
Monocytes Relative: 1 %
Neutro Abs: 27.2 10*3/uL — ABNORMAL HIGH (ref 1.7–7.7)
Neutrophils Relative %: 92 %
Platelet Count: 336 10*3/uL (ref 150–400)
RBC: 3.18 MIL/uL — ABNORMAL LOW (ref 3.87–5.11)
RDW: 20.5 % — ABNORMAL HIGH (ref 11.5–15.5)
Smear Review: NORMAL
WBC Count: 29.8 10*3/uL — ABNORMAL HIGH (ref 4.0–10.5)
nRBC: 0 % (ref 0.0–0.2)

## 2021-07-03 LAB — CMP (CANCER CENTER ONLY)
ALT: 22 U/L (ref 0–44)
AST: 13 U/L — ABNORMAL LOW (ref 15–41)
Albumin: 3.7 g/dL (ref 3.5–5.0)
Alkaline Phosphatase: 78 U/L (ref 38–126)
Anion gap: 8 (ref 5–15)
BUN: 24 mg/dL — ABNORMAL HIGH (ref 8–23)
CO2: 26 mmol/L (ref 22–32)
Calcium: 9.2 mg/dL (ref 8.9–10.3)
Chloride: 105 mmol/L (ref 98–111)
Creatinine: 0.78 mg/dL (ref 0.44–1.00)
GFR, Estimated: >60 mL/min (ref >60)
Glucose, Bld: 82 mg/dL (ref 70–99)
Potassium: 3.4 mmol/L — ABNORMAL LOW (ref 3.5–5.1)
Sodium: 139 mmol/L (ref 135–145)
Total Bilirubin: 0.5 mg/dL (ref 0.3–1.2)
Total Protein: 6 g/dL — ABNORMAL LOW (ref 6.5–8.1)

## 2021-07-03 LAB — SAMPLE TO BLOOD BANK

## 2021-07-03 LAB — LACTATE DEHYDROGENASE: LDH: 166 U/L (ref 98–192)

## 2021-07-03 MED ORDER — SODIUM CHLORIDE 0.9% FLUSH
10.0000 mL | Freq: Once | INTRAVENOUS | Status: AC
  Administered 2021-07-03: 10 mL

## 2021-07-03 MED ORDER — HEPARIN SOD (PORK) LOCK FLUSH 100 UNIT/ML IV SOLN
500.0000 [IU] | Freq: Once | INTRAVENOUS | Status: AC
  Administered 2021-07-03: 500 [IU]

## 2021-07-03 NOTE — Progress Notes (Signed)
Pt self administered udenyca injection on 07/02/2021.

## 2021-07-03 NOTE — Progress Notes (Signed)
Patient had previously self administered this shot at home after receiving it via mail and reported that she had self administered same

## 2021-07-04 ENCOUNTER — Inpatient Hospital Stay: Payer: BC Managed Care – PPO

## 2021-07-07 ENCOUNTER — Other Ambulatory Visit: Payer: Self-pay | Admitting: *Deleted

## 2021-07-07 DIAGNOSIS — C8338 Diffuse large B-cell lymphoma, lymph nodes of multiple sites: Secondary | ICD-10-CM

## 2021-07-10 ENCOUNTER — Inpatient Hospital Stay (HOSPITAL_BASED_OUTPATIENT_CLINIC_OR_DEPARTMENT_OTHER): Payer: BC Managed Care – PPO | Admitting: Hematology

## 2021-07-10 ENCOUNTER — Inpatient Hospital Stay: Payer: BC Managed Care – PPO

## 2021-07-10 ENCOUNTER — Other Ambulatory Visit: Payer: Self-pay

## 2021-07-10 VITALS — BP 136/62 | HR 97 | Temp 97.5°F | Resp 18 | Wt 210.9 lb

## 2021-07-10 DIAGNOSIS — C8338 Diffuse large B-cell lymphoma, lymph nodes of multiple sites: Secondary | ICD-10-CM | POA: Diagnosis not present

## 2021-07-10 DIAGNOSIS — C8331 Diffuse large B-cell lymphoma, lymph nodes of head, face, and neck: Secondary | ICD-10-CM | POA: Diagnosis not present

## 2021-07-10 DIAGNOSIS — Z5111 Encounter for antineoplastic chemotherapy: Secondary | ICD-10-CM | POA: Diagnosis not present

## 2021-07-10 DIAGNOSIS — Z5112 Encounter for antineoplastic immunotherapy: Secondary | ICD-10-CM | POA: Diagnosis not present

## 2021-07-10 DIAGNOSIS — Z95828 Presence of other vascular implants and grafts: Secondary | ICD-10-CM

## 2021-07-10 LAB — CMP (CANCER CENTER ONLY)
ALT: 32 U/L (ref 0–44)
AST: 24 U/L (ref 15–41)
Albumin: 3.8 g/dL (ref 3.5–5.0)
Alkaline Phosphatase: 99 U/L (ref 38–126)
Anion gap: 11 (ref 5–15)
BUN: 15 mg/dL (ref 8–23)
CO2: 26 mmol/L (ref 22–32)
Calcium: 9.5 mg/dL (ref 8.9–10.3)
Chloride: 103 mmol/L (ref 98–111)
Creatinine: 0.82 mg/dL (ref 0.44–1.00)
GFR, Estimated: >60 mL/min (ref >60)
Glucose, Bld: 162 mg/dL — ABNORMAL HIGH (ref 70–99)
Potassium: 3.3 mmol/L — ABNORMAL LOW (ref 3.5–5.1)
Sodium: 140 mmol/L (ref 135–145)
Total Bilirubin: 0.3 mg/dL (ref 0.3–1.2)
Total Protein: 6.4 g/dL — ABNORMAL LOW (ref 6.5–8.1)

## 2021-07-10 LAB — CBC WITH DIFFERENTIAL (CANCER CENTER ONLY)
Abs Immature Granulocytes: 0.51 10*3/uL — ABNORMAL HIGH (ref 0.00–0.07)
Basophils Absolute: 0 10*3/uL (ref 0.0–0.1)
Basophils Relative: 0 %
Eosinophils Absolute: 0.2 10*3/uL (ref 0.0–0.5)
Eosinophils Relative: 2 %
HCT: 31.6 % — ABNORMAL LOW (ref 36.0–46.0)
Hemoglobin: 10.5 g/dL — ABNORMAL LOW (ref 12.0–15.0)
Immature Granulocytes: 7 %
Lymphocytes Relative: 21 %
Lymphs Abs: 1.5 10*3/uL (ref 0.7–4.0)
MCH: 31.7 pg (ref 26.0–34.0)
MCHC: 33.2 g/dL (ref 30.0–36.0)
MCV: 95.5 fL (ref 80.0–100.0)
Monocytes Absolute: 0.9 10*3/uL (ref 0.1–1.0)
Monocytes Relative: 13 %
Neutro Abs: 4.1 10*3/uL (ref 1.7–7.7)
Neutrophils Relative %: 57 %
Platelet Count: 165 10*3/uL (ref 150–400)
RBC: 3.31 MIL/uL — ABNORMAL LOW (ref 3.87–5.11)
RDW: 20.5 % — ABNORMAL HIGH (ref 11.5–15.5)
Smear Review: NORMAL
WBC Count: 7.2 10*3/uL (ref 4.0–10.5)
nRBC: 0.6 % — ABNORMAL HIGH (ref 0.0–0.2)

## 2021-07-10 LAB — LACTATE DEHYDROGENASE: LDH: 254 U/L — ABNORMAL HIGH (ref 98–192)

## 2021-07-10 MED ORDER — SODIUM CHLORIDE 0.9% FLUSH
10.0000 mL | Freq: Once | INTRAVENOUS | Status: AC
  Administered 2021-07-10: 10 mL

## 2021-07-10 MED ORDER — HEPARIN SOD (PORK) LOCK FLUSH 100 UNIT/ML IV SOLN
500.0000 [IU] | Freq: Once | INTRAVENOUS | Status: AC
  Administered 2021-07-10: 500 [IU]

## 2021-07-10 NOTE — Progress Notes (Incomplete)
[facility]   HEMATOLOGY/ONCOLOGY CLINIC NOTE  Date of Service: 07/10/2021   Patient Care Team: Rosine Door as PCP - General (Physician Assistant)  CHIEF COMPLAINTS/PURPOSE OF CONSULTATION:  Follow-up for continued evaluation and management of diffuse large B-cell lymphoma.  HISTORY OF PRESENTING ILLNESS:  Please see previous note for details on initial presentation  INTERVAL HISTORY: Barbara Rhodes is a 66 y.o. female here for continued evaluation and management of of her diffuse large B-cell lymphoma. She reports She is doing well with no new symptoms or concerns.  She notes some urinary urgency.   She notes persistent neuropathy is unchanged.  No significant nausea or vomiting at this time. Improved p.o. intake. No nausea or diarrhea. No other new or acute focal symptoms.  Her blood counts are improving and she has not needed additional transfusion support.  We talked about starting her on cycle 3 of chemo immunotherapy  and discussed this in detail.  She is tolerating Udenyca well with no prohibitive toxicities at this time.  Labs done today were discussed in detail.  MEDICAL HISTORY:  Past Medical History:  Diagnosis Date   Arthritis    Breast cancer ([place]) 2000   Stage 2B, ER+/PR-/Her2-   Colon polyps    Family history of adverse reaction to anesthesia 83   Brother passed away at 70 years old from malignant hyperthermia.   Family history of breast cancer    Family history of prostate cancer    Hypertension     SURGICAL HISTORY: Past Surgical History:  Procedure Laterality Date   ABDOMINAL HYSTERECTOMY     BACK SURGERY     BREAST SURGERY     COLONOSCOPY WITH PROPOFOL     pt said propofol burned her throat when pushed   IR US GUIDE BX ASP/DRAIN  04/03/2021   LYMPH NODE BIOPSY Left 06/02/2021   Procedure: EXCISIONAL CERVICAL LYMPH NODE BIOPSY;  Surgeon: Jovita Kussmaul, MD;  Location: [facility];  Service: General;  Laterality: Left;    MASTECTOMY Right 2000   MASTECTOMY Right 2000   PORTACATH PLACEMENT Right 06/02/2021   Procedure: INSERTION PORT-A-CATH;  Surgeon: Jovita Kussmaul, MD;  Location: [facility];  Service: General;  Laterality: Right;   POSTERIOR CERVICAL LAMINECTOMY Left 01/23/2018   Procedure: LEFT CERVICAL SEVEN- [place], MICRODISCECTOMY;  Surgeon: Jovita Gamma, MD;  Location: [facility];  Service: Neurosurgery;  Laterality: Left;   ROTATOR CUFF REPAIR     TONSILLECTOMY     TRANSFORAMINAL LUMBAR INTERBODY FUSION (TLIF) WITH PEDICLE SCREW FIXATION 2 LEVEL Right 10/30/2019   Procedure: Right Lumbar 3-4 Lumbar 4-5 Transforaminal lumbar interbody fusion;  Surgeon: Erline Levine, MD;  Location: [facility];  Service: Neurosurgery;  Laterality: Right;  3C/RM 21   TUBAL LIGATION     WISDOM TOOTH EXTRACTION      SOCIAL HISTORY: Social History   Socioeconomic History   Marital status: Married    Spouse name: Not on file   Number of children: Not on file   Years of education: Not on file   Highest education level: Not on file  Occupational History   Not on file  Tobacco Use   Smoking status: Never   Smokeless tobacco: Never  Vaping Use   Vaping Use: Never used  Substance and Sexual Activity   Alcohol use: No   Drug use: No   Sexual activity: Not on file  Other Topics Concern   Not on file  Social History Narrative   Not on  file   Social Determinants of Health   Financial Resource Strain: Not on file  Food Insecurity: Not on file  Transportation Needs: Not on file  Physical Activity: Not on file  Stress: Not on file  Social Connections: Not on file  Intimate Partner Violence: Not on file    FAMILY HISTORY: Family History  Problem Relation Age of Onset   Breast cancer Paternal Grandmother 35   Prostate cancer Father        dx in his 34s   Colon polyps Father 26   Dementia Mother    Hypertension Mother    Dementia Maternal Aunt    Stroke Maternal Grandfather    Rheum  arthritis Paternal Grandfather    Breast cancer Other        MGM's sister, post-menopausal breast cancer   Breast cancer Cousin 56       paternal 2nd cousin    ALLERGIES:  is allergic to penicillins and rituximab-pvvr.  MEDICATIONS:  Current Outpatient Medications  Medication Sig Dispense Refill   acetaminophen (TYLENOL) 500 MG tablet Take 1,000 mg by mouth daily as needed for mild pain or moderate pain.     allopurinol (ZYLOPRIM) 300 MG tablet Take 1 tablet (300 mg total) by mouth daily. 30 tablet 1   celecoxib (CELEBREX) 200 MG capsule Restart in 5 days (Patient taking differently: Take 200 mg by mouth daily.) 1 capsule 0   Cholecalciferol (VITAMIN D3) 50 MCG (2000 UT) capsule Take 4,000 Units by mouth daily.     docusate sodium (COLACE) 100 MG capsule Take 100 mg by mouth daily as needed for mild constipation.     lidocaine-prilocaine (EMLA) cream Apply to affected area once (Patient not taking: Reported on 06/01/2021) 30 g 3   LORazepam (ATIVAN) 0.5 MG tablet Take 1 tablet (0.5 mg total) by mouth every 6 (six) hours as needed (Nausea or vomiting). (Patient not taking: Reported on 06/01/2021) 30 tablet 0   Magnesium 250 MG TABS Take 250 mg by mouth daily.     Omega-3 Fatty Acids (FISH OIL ULTRA) 1400 MG CAPS Take 1,400 mg by mouth 3 (three) times a week.     ondansetron (ZOFRAN) 8 MG tablet Take 1 tablet (8 mg total) by mouth 2 (two) times daily as needed for refractory nausea / vomiting. Start on day 3 after cyclophosphamide chemotherapy. 30 tablet 1   oxyCODONE (OXY IR/ROXICODONE) 5 MG immediate release tablet Take 1 tablet (5 mg total) by mouth every 4 (four) hours as needed for moderate pain. 30 tablet 0   pantoprazole (PROTONIX) 40 MG tablet Take 1 tablet (40 mg total) by mouth at bedtime. 30 tablet 3   pegfilgrastim-cbqv (UDENYCA) 6 MG/0.6ML injection 20m [place] x 1 dose -- 24 to 48 hours after completion of each cycle of chemotherapy. 0.6 mL 4   polyethylene glycol (MIRALAX / GLYCOLAX)  17 g packet Take 17 g by mouth daily. 14 each 0   Potassium 99 MG TABS Take 99 mg by mouth daily.     potassium chloride SA (KLOR-CON M) 20 MEQ tablet Take 1 tablet (20 mEq total) by mouth daily. 7 tablet 0   predniSONE (DELTASONE) 20 MG tablet Take 5 tablets (100 mg total) by mouth daily. Take with food on days 1-5 of chemotherapy. 25 tablet 5   prochlorperazine (COMPAZINE) 10 MG tablet Take 1 tablet (10 mg total) by mouth every 6 (six) hours as needed (Nausea or vomiting). 30 tablet 6   traMADol (ULTRAM) 50 MG tablet  Take 50 mg by mouth daily as needed for moderate pain.     triamterene-hydrochlorothiazide (MAXZIDE-25) 37.5-25 MG tablet Take 1 tablet by mouth daily.      No current facility-administered medications for this visit.    REVIEW OF SYSTEMS:    10 Point review of Systems was done is negative except as noted above.  PHYSICAL EXAMINATION: ECOG PERFORMANCE STATUS: 1 - Symptomatic but completely ambulatory Vital signs reviewed NAD GENERAL:alert, in no acute distress and comfortable SKIN: no acute rashes, no significant lesions EYES: conjunctiva are pink and non-injected, sclera anicteric NECK: supple, no JVD LYMPH:  no palpable lymphadenopathy in the cervical, axillary or inguinal regions LUNGS: clear to auscultation b/l with normal respiratory effort HEART: regular rate & rhythm ABDOMEN:  normoactive bowel sounds , non tender, not distended. Extremity: no pedal edema PSYCH: alert & oriented x 3 with fluent speech NEURO: no focal motor/sensory deficits  LABORATORY DATA:  I have reviewed the data as listed  .    Latest Ref Rng & Units 06/22/2021    7:31 AM 06/13/2021    7:53 AM  CBC  WBC 4.0 - 10.5 K/uL 6.8   16.1    Hemoglobin 12.0 - 15.0 g/dL 9.7   9.0    Hematocrit 36.0 - 46.0 % 28.2   27.5    Platelets 150 - 400 K/uL 415   207      .    Latest Ref Rng & Units 06/22/2021    7:31 AM 06/13/2021    7:53 AM  CMP  Glucose 70 - 99 mg/dL 91   135    BUN 8 - 23 mg/dL  14   10    Creatinine 0.44 - 1.00 mg/dL 0.75   0.86    Sodium 135 - 145 mmol/L 141   141    Potassium 3.5 - 5.1 mmol/L 3.5   3.1    Chloride 98 - 111 mmol/L 106   105    CO2 22 - 32 mmol/L 29   27    Calcium 8.9 - 10.3 mg/dL 9.4   8.5    Total Protein 6.5 - 8.1 g/dL 6.3   6.1    Total Bilirubin 0.3 - 1.2 mg/dL 0.4   0.5    Alkaline Phos 38 - 126 U/L 107   107    AST 15 - 41 U/L 32   19    ALT 0 - 44 U/L 54   39     . Lab Results  Component Value Date   LDH 166 07/03/2021    SURGICAL PATHOLOGY  CASE: [id number]  PATIENT: [facility]  Surgical Pathology Report      Clinical History: remote history of breast cancer, now with  indeterminate left supraclavicular lymphadenopathy (cm)      FINAL MICROSCOPIC DIAGNOSIS:   A. LYMPH NODE, LEFT SUPRACLAVICULAR, NEEDLE CORE BIOPSY:  -Atypical lymphoid proliferation  -See comment   COMMENT:   The sections show several very small needle core biopsy fragments of  apparently lymph nodal tissue displaying an atypical lymphoid  proliferation of primarily large lymphoid appearing cells characterized  by vesicular chromatin and prominent nucleoli associated with scattered  mitosis.  The appearance is apparently diffuse with lack of atypical  follicles.  Flow cytometric analysis was performed [facility] [id number]) and  shows predominance of T lymphocytes with nonspecific changes.  No  monoclonal B-cell population identified.  A battery of  immunohistochemical stains was performed but the tissue is prominently  exhausted on  deeper sectioning.  Scattered residual very minute  fragments show that the lymphoid appearing cells are positive for LCA,  CD20, CD79a, BCL6, MUM1, and Bcl-2.  No significant staining is seen  with CD138, CD10, CD34, cyclin D1, S100, cytokeratin AE1/AE3,  cytokeratin 8/18, or GATA3.  There is an admixed T-cell population to a  lesser extent as seen with CD3 and CD5 and there is no apparent  co-expression of CD5  in B-cell areas.  The following stains were  performed but there is no optimal tissue present on deeper sectioning  for evaluation including CD30, TdT, and in situ hybridization for kappa,  lambda and EBV.  The overall findings are limited but very concerning  for involvement by a B-cell lymphoproliferative process particularly  large cell lymphoma.  Excisional biopsy is recommended to further  evaluate this process.   SURGICAL PATHOLOGY  CASE: [id number]  PATIENT: Berkley Harvey  Surgical Pathology Report      Clinical History: Cervical lymphocyte; history of Hodgkin's lymphoma  ([facility])    FINAL MICROSCOPIC DIAGNOSIS:   A. LYMPH NODE, LEFT NECK, EXCISION:   -  Diffuse large B-cell lymphoma, activated B-cell subtype  -  See comment   COMMENT:   The excisional biopsy consists of an enlarged lymph node with complete  effacement by a large pleomorphic population of lymphoid cells with  vesicular chromatin and prominent nucleoli.  By immunohistochemistry,  the atypical lymphocytes are positive for CD20, PAX5, Bcl-2, BCL6 and  Mum-1.  The cells are negative for CD5, CD10, CD30 and EBV by in situ  hybridization.  The proliferative rate by Ki-67 is 60-70%.  CD3  highlights background benign-appearing lymphocytes.  Overall, the  findings are consistent with a diffuse large B cell lymphoma, activated  B-cell phenotype.  Preliminary results of this case were discussed with  Dr. Jenell Milliner on Jun 06, 2021.  FISH studies (high-grade/large B-cell  lymphoma) are pending and will be reported in an addendum.   RADIOGRAPHIC STUDIES: I have personally reviewed the radiological images as listed and agreed with the findings in the report. No results found.  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, sports with previous history of right-sided breast cancer status post right mastectomy and adjuvant chemotherapy including doxorubicin now with  #1 stage III/IV diffuse large B-cell  lymphoma.-Activated B-cell subtype High risk lymphoma FISH panel no evidence of double or triple hit lymphoma Patient received cycle 1 of R-CVP  #2 anemia due to lymphoma involvement of bone marrow #3 status post Port-A-Cath placement #4 thrombocytopenia secondary to #1 #5 high risk of tumor lysis syndrome #6 anxiety #7 remote history of right breast cancer status postmastectomy and adjuvant chemotherapy #8 rigors with Rituxan dose being At 200 mg/h  Plan -Labs done today were reviewed in detail. CBC stable with improved hemoglobin to 10.5. No indication for transfusion support at this time. CMP stable LDH elevated at 254. -Patient continues Udenyca for growth factor support at home. -Given her previous rigors with Rituxan we shall do the Rituxan again as a first-time dose with additional premedications including addition of Demerol. For the third cycle we will Hold Rituxan rate at 200 mg/h. -We talked about starting her on cycle 3 of chemo immunotherapy  and discussed this in detail. -Continue to premedicate with her prednisone for 2 days prior to her treatment and take the remaining prednisone for 3 days after her treatment with additional famotidine and an antihistamine to reduce risk of Rituxan related reactions. -We will need to continue monitoring  blood counts weekly for transfusion requirement evaluation. -Continue reduced dosage vincristine 1 mg cap. -RTC in 1 week. -Schedule PET CT scan in 3 weeks.  Follow-up Please schedule next 3 cycles of chemotherapy as per orders for R-CEOP We will flush and labs with day 1 and day 12 of each cycle of treatment. MD visit with day 1 of each cycle of treatment. Next visit in 1 week. PET CT scan in 3 weeks  The total time spent in the appointment was *** minutes*.  All of the patient's questions were answered with apparent satisfaction. The patient knows to call the clinic with any problems, questions or concerns.   Sullivan Lone MD MS  AAHIVMS [facility] Hematology/Oncology Physician [facility]  .*Total Encounter Time as defined by the Centers for Medicare and Medicaid Services includes, in addition to the face-to-face time of a patient visit (documented in the note above) non-face-to-face time: obtaining and reviewing outside history, ordering and reviewing medications, tests or procedures, care coordination (communications with other health care professionals or caregivers) and documentation in the medical record.  I, Melene Muller, am acting as scribe for Dr. Sullivan Lone, MD.

## 2021-07-12 ENCOUNTER — Telehealth: Payer: Self-pay | Admitting: Hematology

## 2021-07-12 ENCOUNTER — Encounter: Payer: Self-pay | Admitting: Hematology

## 2021-07-12 NOTE — Telephone Encounter (Signed)
Scheduled follow-up appointments per 6/19 los. Patient is aware. 

## 2021-07-14 ENCOUNTER — Encounter: Payer: Self-pay | Admitting: Hematology

## 2021-07-16 ENCOUNTER — Encounter: Payer: Self-pay | Admitting: Hematology

## 2021-07-17 ENCOUNTER — Other Ambulatory Visit: Payer: BC Managed Care – PPO

## 2021-07-18 ENCOUNTER — Other Ambulatory Visit: Payer: Self-pay | Admitting: Hematology and Oncology

## 2021-07-18 ENCOUNTER — Other Ambulatory Visit: Payer: Self-pay

## 2021-07-18 DIAGNOSIS — C8338 Diffuse large B-cell lymphoma, lymph nodes of multiple sites: Secondary | ICD-10-CM

## 2021-07-19 ENCOUNTER — Other Ambulatory Visit: Payer: Self-pay

## 2021-07-19 ENCOUNTER — Encounter: Payer: Self-pay | Admitting: Hematology

## 2021-07-19 ENCOUNTER — Inpatient Hospital Stay: Payer: BC Managed Care – PPO

## 2021-07-19 ENCOUNTER — Other Ambulatory Visit: Payer: Self-pay | Admitting: Hematology

## 2021-07-19 ENCOUNTER — Inpatient Hospital Stay (HOSPITAL_BASED_OUTPATIENT_CLINIC_OR_DEPARTMENT_OTHER): Payer: BC Managed Care – PPO | Admitting: Hematology

## 2021-07-19 VITALS — BP 142/74 | HR 65 | Temp 98.4°F | Resp 20 | Ht 64.0 in | Wt 208.0 lb

## 2021-07-19 DIAGNOSIS — C8338 Diffuse large B-cell lymphoma, lymph nodes of multiple sites: Secondary | ICD-10-CM | POA: Diagnosis not present

## 2021-07-19 DIAGNOSIS — Z95828 Presence of other vascular implants and grafts: Secondary | ICD-10-CM

## 2021-07-19 DIAGNOSIS — Z5112 Encounter for antineoplastic immunotherapy: Secondary | ICD-10-CM | POA: Diagnosis not present

## 2021-07-19 DIAGNOSIS — C8331 Diffuse large B-cell lymphoma, lymph nodes of head, face, and neck: Secondary | ICD-10-CM | POA: Diagnosis not present

## 2021-07-19 DIAGNOSIS — Z5111 Encounter for antineoplastic chemotherapy: Secondary | ICD-10-CM

## 2021-07-19 LAB — CBC WITH DIFFERENTIAL (CANCER CENTER ONLY)
Abs Immature Granulocytes: 0.05 10*3/uL (ref 0.00–0.07)
Basophils Absolute: 0 10*3/uL (ref 0.0–0.1)
Basophils Relative: 0 %
Eosinophils Absolute: 0 10*3/uL (ref 0.0–0.5)
Eosinophils Relative: 0 %
HCT: 31.6 % — ABNORMAL LOW (ref 36.0–46.0)
Hemoglobin: 10.8 g/dL — ABNORMAL LOW (ref 12.0–15.0)
Immature Granulocytes: 1 %
Lymphocytes Relative: 19 %
Lymphs Abs: 1.7 10*3/uL (ref 0.7–4.0)
MCH: 32.3 pg (ref 26.0–34.0)
MCHC: 34.2 g/dL (ref 30.0–36.0)
MCV: 94.6 fL (ref 80.0–100.0)
Monocytes Absolute: 0.7 10*3/uL (ref 0.1–1.0)
Monocytes Relative: 8 %
Neutro Abs: 6.4 10*3/uL (ref 1.7–7.7)
Neutrophils Relative %: 72 %
Platelet Count: 332 10*3/uL (ref 150–400)
RBC: 3.34 MIL/uL — ABNORMAL LOW (ref 3.87–5.11)
RDW: 19.4 % — ABNORMAL HIGH (ref 11.5–15.5)
WBC Count: 8.9 10*3/uL (ref 4.0–10.5)
nRBC: 0 % (ref 0.0–0.2)

## 2021-07-19 LAB — LACTATE DEHYDROGENASE: LDH: 220 U/L — ABNORMAL HIGH (ref 98–192)

## 2021-07-19 LAB — CMP (CANCER CENTER ONLY)
ALT: 23 U/L (ref 0–44)
AST: 16 U/L (ref 15–41)
Albumin: 4.1 g/dL (ref 3.5–5.0)
Alkaline Phosphatase: 82 U/L (ref 38–126)
Anion gap: 10 (ref 5–15)
BUN: 22 mg/dL (ref 8–23)
CO2: 24 mmol/L (ref 22–32)
Calcium: 9.7 mg/dL (ref 8.9–10.3)
Chloride: 107 mmol/L (ref 98–111)
Creatinine: 0.79 mg/dL (ref 0.44–1.00)
GFR, Estimated: >60 mL/min (ref >60)
Glucose, Bld: 106 mg/dL — ABNORMAL HIGH (ref 70–99)
Potassium: 3.4 mmol/L — ABNORMAL LOW (ref 3.5–5.1)
Sodium: 141 mmol/L (ref 135–145)
Total Bilirubin: 0.3 mg/dL (ref 0.3–1.2)
Total Protein: 6.7 g/dL (ref 6.5–8.1)

## 2021-07-19 MED ORDER — SODIUM CHLORIDE 0.9% FLUSH
10.0000 mL | INTRAVENOUS | Status: DC | PRN
  Administered 2021-07-19: 10 mL

## 2021-07-19 MED ORDER — SODIUM CHLORIDE 0.9 % IV SOLN
750.0000 mg/m2 | Freq: Once | INTRAVENOUS | Status: AC
  Administered 2021-07-19: 1560 mg via INTRAVENOUS
  Filled 2021-07-19: qty 78

## 2021-07-19 MED ORDER — HEPARIN SOD (PORK) LOCK FLUSH 100 UNIT/ML IV SOLN
500.0000 [IU] | Freq: Once | INTRAVENOUS | Status: AC | PRN
  Administered 2021-07-19: 500 [IU]

## 2021-07-19 MED ORDER — FAMOTIDINE IN NACL 20-0.9 MG/50ML-% IV SOLN
20.0000 mg | Freq: Once | INTRAVENOUS | Status: AC
  Administered 2021-07-19: 20 mg via INTRAVENOUS
  Filled 2021-07-19: qty 50

## 2021-07-19 MED ORDER — SODIUM CHLORIDE 0.9% FLUSH
10.0000 mL | Freq: Once | INTRAVENOUS | Status: AC
  Administered 2021-07-19: 10 mL

## 2021-07-19 MED ORDER — SODIUM CHLORIDE 0.9 % IV SOLN
50.0000 mg/m2 | Freq: Once | INTRAVENOUS | Status: AC
  Administered 2021-07-19: 100 mg via INTRAVENOUS
  Filled 2021-07-19: qty 5

## 2021-07-19 MED ORDER — ACETAMINOPHEN 325 MG PO TABS
650.0000 mg | ORAL_TABLET | Freq: Once | ORAL | Status: AC
  Administered 2021-07-19: 650 mg via ORAL
  Filled 2021-07-19: qty 2

## 2021-07-19 MED ORDER — MONTELUKAST SODIUM 10 MG PO TABS
10.0000 mg | ORAL_TABLET | Freq: Every day | ORAL | Status: DC
  Administered 2021-07-19: 10 mg via ORAL
  Filled 2021-07-19: qty 1

## 2021-07-19 MED ORDER — SODIUM CHLORIDE 0.9 % IV SOLN: Freq: Once | INTRAVENOUS | Status: AC

## 2021-07-19 MED ORDER — VINCRISTINE SULFATE CHEMO INJECTION 1 MG/ML
1.0000 mg | Freq: Once | INTRAVENOUS | Status: AC
  Administered 2021-07-19: 1 mg via INTRAVENOUS
  Filled 2021-07-19: qty 1

## 2021-07-19 MED ORDER — MEPERIDINE HCL 25 MG/ML IJ SOLN
25.0000 mg | Freq: Once | INTRAMUSCULAR | Status: AC
  Administered 2021-07-19: 25 mg via INTRAVENOUS
  Filled 2021-07-19: qty 1

## 2021-07-19 MED ORDER — METHYLPREDNISOLONE SODIUM SUCC 125 MG IJ SOLR
125.0000 mg | Freq: Every day | INTRAMUSCULAR | Status: DC
  Administered 2021-07-19: 125 mg via INTRAVENOUS
  Filled 2021-07-19: qty 2

## 2021-07-19 MED ORDER — PALONOSETRON HCL INJECTION 0.25 MG/5ML
0.2500 mg | Freq: Once | INTRAVENOUS | Status: AC
  Administered 2021-07-19: 0.25 mg via INTRAVENOUS
  Filled 2021-07-19: qty 5

## 2021-07-19 MED ORDER — SODIUM CHLORIDE 0.9 % IV SOLN
375.0000 mg/m2 | Freq: Once | INTRAVENOUS | Status: AC
  Administered 2021-07-19: 800 mg via INTRAVENOUS
  Filled 2021-07-19: qty 50

## 2021-07-19 MED ORDER — DIPHENHYDRAMINE HCL 25 MG PO CAPS
50.0000 mg | ORAL_CAPSULE | Freq: Once | ORAL | Status: AC
  Administered 2021-07-19: 50 mg via ORAL
  Filled 2021-07-19: qty 2

## 2021-07-19 NOTE — Patient Instructions (Signed)
[facility]  Discharge Instructions: Thank you for choosing [facility] to provide your oncology and hematology care.   If you have a lab appointment with the [facility], please go directly to the [facility] and check in at the registration area.   Wear comfortable clothing and clothing appropriate for easy access to any Portacath or PICC line.   We strive to give you quality time with your provider. You may need to reschedule your appointment if you arrive late (15 or more minutes).  Arriving late affects you and other patients whose appointments are after yours.  Also, if you miss three or more appointments without notifying the office, you may be dismissed from the clinic at the provider's discretion.      For prescription refill requests, have your pharmacy contact our office and allow 72 hours for refills to be completed.    Today you received the following chemotherapy and/or immunotherapy agents: Vincristine, Cytoxan, Etoposide, and Rituximab      To help prevent nausea and vomiting after your treatment, we encourage you to take your nausea medication as directed.  BELOW ARE SYMPTOMS THAT SHOULD BE REPORTED IMMEDIATELY: *FEVER GREATER THAN 100.4 F (38 C) OR HIGHER *CHILLS OR SWEATING *NAUSEA AND VOMITING THAT IS NOT CONTROLLED WITH YOUR NAUSEA MEDICATION *UNUSUAL SHORTNESS OF BREATH *UNUSUAL BRUISING OR BLEEDING *URINARY PROBLEMS (pain or burning when urinating, or frequent urination) *BOWEL PROBLEMS (unusual diarrhea, constipation, pain near the anus) TENDERNESS IN MOUTH AND THROAT WITH OR WITHOUT PRESENCE OF ULCERS (sore throat, sores in mouth, or a toothache) UNUSUAL RASH, SWELLING OR PAIN  UNUSUAL VAGINAL DISCHARGE OR ITCHING   Items with * indicate a potential emergency and should be followed up as soon as possible or go to the Emergency Department if any problems should occur.  Please show the CHEMOTHERAPY ALERT CARD or  IMMUNOTHERAPY ALERT CARD at check-in to the Emergency Department and triage nurse.  Should you have questions after your visit or need to cancel or reschedule your appointment, please contact [facility]  Dept: [phone]  and follow the prompts.  Office hours are 8:00 a.m. to 4:30 p.m. Monday - Friday. Please note that voicemails left after 4:00 p.m. may not be returned until the following business day.  We are closed weekends and major holidays. You have access to a nurse at all times for urgent questions. Please call the main number to the clinic Dept: [phone] and follow the prompts.   For any non-urgent questions, you may also contact your provider using MyChart. We now offer e-Visits for anyone 68 and older to request care online for non-urgent symptoms. For details visit [email or web address].   Also download the MyChart app! Go to the app store, search "MyChart", open the app, select [facility], and log in with your MyChart username and password.  Due to Covid, a mask is required upon entering the hospital/clinic. If you do not have a mask, one will be given to you upon arrival. For doctor visits, patients may have 1 support person aged 68 or older with them. For treatment visits, patients cannot have anyone with them due to current Covid guidelines and our immunocompromised population.   Vincristine injection What is this medication? VINCRISTINE (vin KRIS teen) is a chemotherapy drug. It slows the growth of cancer cells. This medicine is used to treat many types of cancer like Hodgkin's disease, leukemia, non-Hodgkin's lymphoma, neuroblastoma (brain cancer), rhabdomyosarcoma, and Wilms' tumor. This  medicine may be used for other purposes; ask your health care provider or pharmacist if you have questions. COMMON BRAND NAME(S): Oncovin, Vincasar PFS What should I tell my care team before I take this medication? They need to know if you have any of these  conditions: blood disorders gout infection (especially chickenpox, cold sores, or herpes) kidney disease liver disease lung disease nervous system disease like Charcot-Marie-Tooth (CMT) recent or ongoing radiation therapy an unusual or allergic reaction to vincristine, other chemotherapy agents, other medicines, foods, dyes, or preservatives pregnant or trying to get pregnant breast-feeding How should I use this medication? This drug is given as an infusion into a vein. It is administered in a hospital or clinic by a specially trained health care professional. If you have pain, swelling, burning, or any unusual feeling around the site of your injection, tell your health care professional right away. Talk to your pediatrician regarding the use of this medicine in children. While this drug may be prescribed for selected conditions, precautions do apply. Overdosage: If you think you have taken too much of this medicine contact a poison control center or emergency room at once. NOTE: This medicine is only for you. Do not share this medicine with others. What if I miss a dose? It is important not to miss your dose. Call your doctor or health care professional if you are unable to keep an appointment. What may interact with this medication? certain medicines for fungal infections like itraconazole, ketoconazole, posaconazole, voriconazole certain medicines for seizures like phenytoin This list may not describe all possible interactions. Give your health care provider a list of all the medicines, herbs, non-prescription drugs, or dietary supplements you use. Also tell them if you smoke, drink alcohol, or use illegal drugs. Some items may interact with your medicine. What should I watch for while using this medication? This drug may make you feel generally unwell. This is not uncommon, as chemotherapy can affect healthy cells as well as cancer cells. Report any side effects. Continue your course of  treatment even though you feel ill unless your doctor tells you to stop. You may need blood work done while you are taking this medicine. This medicine will cause constipation. Try to have a bowel movement at least every 2 to 3 days. If you do not have a bowel movement for 3 days, call your doctor or health care professional. In some cases, you may be given additional medicines to help with side effects. Follow all directions for their use. Do not become pregnant while taking this medicine. Women should inform their doctor if they wish to become pregnant or think they might be pregnant. There is a potential for serious side effects to an unborn child. Talk to your health care professional or pharmacist for more information. Do not breast-feed an infant while taking this medicine. This medicine may make it more difficult to get pregnant or to father a child. Talk to your healthcare professional if you are concerned about your fertility. What side effects may I notice from receiving this medication? Side effects that you should report to your doctor or health care professional as soon as possible: allergic reactions like skin rash, itching or hives, swelling of the face, lips, or tongue breathing problems confusion or changes in emotions or moods constipation cough mouth sores muscle weakness nausea and vomiting pain, swelling, redness or irritation at the injection site pain, tingling, numbness in the hands or feet problems with balance, talking, walking seizures stomach pain  trouble passing urine or change in the amount of urine Side effects that usually do not require medical attention (report to your doctor or health care professional if they continue or are bothersome): diarrhea hair loss jaw pain loss of appetite This list may not describe all possible side effects. Call your doctor for medical advice about side effects. You may report side effects to FDA at 1-800-FDA-1088. Where  should I keep my medication? This drug is given in a hospital or clinic and will not be stored at home. NOTE: This sheet is a summary. It may not cover all possible information. If you have questions about this medicine, talk to your doctor, pharmacist, or health care provider.  2023 Elsevier/Gold Standard (2020-12-09 00:00:00)  Cyclophosphamide Injection What is this medication? CYCLOPHOSPHAMIDE (sye kloe FOSS fa mide) is a chemotherapy drug. It slows the growth of cancer cells. This medicine is used to treat many types of cancer like lymphoma, myeloma, leukemia, breast cancer, and ovarian cancer, to name a few. This medicine may be used for other purposes; ask your health care provider or pharmacist if you have questions. COMMON BRAND NAME(S): Cyclophosphamide, Cytoxan, Neosar What should I tell my care team before I take this medication? They need to know if you have any of these conditions: heart disease history of irregular heartbeat infection kidney disease liver disease low blood counts, like white cells, platelets, or red blood cells on hemodialysis recent or ongoing radiation therapy scarring or thickening of the lungs trouble passing urine an unusual or allergic reaction to cyclophosphamide, other medicines, foods, dyes, or preservatives pregnant or trying to get pregnant breast-feeding How should I use this medication? This drug is usually given as an injection into a vein or muscle or by infusion into a vein. It is administered in a hospital or clinic by a specially trained health care professional. Talk to your pediatrician regarding the use of this medicine in children. Special care may be needed. Overdosage: If you think you have taken too much of this medicine contact a poison control center or emergency room at once. NOTE: This medicine is only for you. Do not share this medicine with others. What if I miss a dose? It is important not to miss your dose. Call your  doctor or health care professional if you are unable to keep an appointment. What may interact with this medication? amphotericin B azathioprine certain antivirals for HIV or hepatitis certain medicines for blood pressure, heart disease, irregular heart beat certain medicines that treat or prevent blood clots like warfarin certain other medicines for cancer cyclosporine etanercept indomethacin medicines that relax muscles for surgery medicines to increase blood counts metronidazole This list may not describe all possible interactions. Give your health care provider a list of all the medicines, herbs, non-prescription drugs, or dietary supplements you use. Also tell them if you smoke, drink alcohol, or use illegal drugs. Some items may interact with your medicine. What should I watch for while using this medication? Your condition will be monitored carefully while you are receiving this medicine. You may need blood work done while you are taking this medicine. Drink water or other fluids as directed. Urinate often, even at night. Some products may contain alcohol. Ask your health care professional if this medicine contains alcohol. Be sure to tell all health care professionals you are taking this medicine. Certain medicines, like metronidazole and disulfiram, can cause an unpleasant reaction when taken with alcohol. The reaction includes flushing, headache, nausea, vomiting, sweating, and  increased thirst. The reaction can last from 30 minutes to several hours. Do not become pregnant while taking this medicine or for 1 year after stopping it. Women should inform their health care professional if they wish to become pregnant or think they might be pregnant. Men should not father a child while taking this medicine and for 4 months after stopping it. There is potential for serious side effects to an unborn child. Talk to your health care professional for more information. Do not breast-feed an  infant while taking this medicine or for 1 week after stopping it. This medicine has caused ovarian failure in some women. This medicine may make it more difficult to get pregnant. Talk to your health care professional if you are concerned about your fertility. This medicine has caused decreased sperm counts in some men. This may make it more difficult to father a child. Talk to your health care professional if you are concerned about your fertility. Call your health care professional for advice if you get a fever, chills, or sore throat, or other symptoms of a cold or flu. Do not treat yourself. This medicine decreases your body's ability to fight infections. Try to avoid being around people who are sick. Avoid taking medicines that contain aspirin, acetaminophen, ibuprofen, naproxen, or ketoprofen unless instructed by your health care professional. These medicines may hide a fever. Talk to your health care professional about your risk of cancer. You may be more at risk for certain types of cancer if you take this medicine. If you are going to need surgery or other procedure, tell your health care professional that you are using this medicine. Be careful brushing or flossing your teeth or using a toothpick because you may get an infection or bleed more easily. If you have any dental work done, tell your dentist you are receiving this medicine. What side effects may I notice from receiving this medication? Side effects that you should report to your doctor or health care professional as soon as possible: allergic reactions like skin rash, itching or hives, swelling of the face, lips, or tongue breathing problems nausea, vomiting signs and symptoms of bleeding such as bloody or black, tarry stools; red or dark brown urine; spitting up blood or brown material that looks like coffee grounds; red spots on the skin; unusual bruising or bleeding from the eyes, gums, or nose signs and symptoms of heart  failure like fast, irregular heartbeat, sudden weight gain; swelling of the ankles, feet, hands signs and symptoms of infection like fever; chills; cough; sore throat; pain or trouble passing urine signs and symptoms of kidney injury like trouble passing urine or change in the amount of urine signs and symptoms of liver injury like dark yellow or brown urine; general ill feeling or flu-like symptoms; light-colored stools; loss of appetite; nausea; right upper belly pain; unusually weak or tired; yellowing of the eyes or skin Side effects that usually do not require medical attention (report to your doctor or health care professional if they continue or are bothersome): confusion decreased hearing diarrhea facial flushing hair loss headache loss of appetite missed menstrual periods signs and symptoms of low red blood cells or anemia such as unusually weak or tired; feeling faint or lightheaded; falls skin discoloration This list may not describe all possible side effects. Call your doctor for medical advice about side effects. You may report side effects to FDA at 1-800-FDA-1088. Where should I keep my medication? This drug is given in a hospital  or clinic and will not be stored at home. NOTE: This sheet is a summary. It may not cover all possible information. If you have questions about this medicine, talk to your doctor, pharmacist, or health care provider.  2023 Elsevier/Gold Standard (2020-12-09 00:00:00) Etoposide, VP-16 injection What is this medication? ETOPOSIDE, VP-16 (e toe POE side) is a chemotherapy drug. It is used to treat testicular cancer, lung cancer, and other cancers. This medicine may be used for other purposes; ask your health care provider or pharmacist if you have questions. COMMON BRAND NAME(S): Etopophos, Toposar, VePesid What should I tell my care team before I take this medication? They need to know if you have any of these conditions: infection kidney  disease liver disease low blood counts, like low white cell, platelet, or red cell counts an unusual or allergic reaction to etoposide, other medicines, foods, dyes, or preservatives pregnant or trying to get pregnant breast-feeding How should I use this medication? This medicine is for infusion into a vein. It is administered in a hospital or clinic by a specially trained health care professional. Talk to your pediatrician regarding the use of this medicine in children. Special care may be needed. Overdosage: If you think you have taken too much of this medicine contact a poison control center or emergency room at once. NOTE: This medicine is only for you. Do not share this medicine with others. What if I miss a dose? It is important not to miss your dose. Call your doctor or health care professional if you are unable to keep an appointment. What may interact with this medication? This medicine may interact with the following medications: warfarin This list may not describe all possible interactions. Give your health care provider a list of all the medicines, herbs, non-prescription drugs, or dietary supplements you use. Also tell them if you smoke, drink alcohol, or use illegal drugs. Some items may interact with your medicine. What should I watch for while using this medication? Visit your doctor for checks on your progress. This drug may make you feel generally unwell. This is not uncommon, as chemotherapy can affect healthy cells as well as cancer cells. Report any side effects. Continue your course of treatment even though you feel ill unless your doctor tells you to stop. In some cases, you may be given additional medicines to help with side effects. Follow all directions for their use. Call your doctor or health care professional for advice if you get a fever, chills or sore throat, or other symptoms of a cold or flu. Do not treat yourself. This drug decreases your body's ability to fight  infections. Try to avoid being around people who are sick. This medicine may increase your risk to bruise or bleed. Call your doctor or health care professional if you notice any unusual bleeding. Talk to your doctor about your risk of cancer. You may be more at risk for certain types of cancers if you take this medicine. Do not become pregnant while taking this medicine or for at least 6 months after stopping it. Women should inform their doctor if they wish to become pregnant or think they might be pregnant. Women of child-bearing potential will need to have a negative pregnancy test before starting this medicine. There is a potential for serious side effects to an unborn child. Talk to your health care professional or pharmacist for more information. Do not breast-feed an infant while taking this medicine. Men must use a latex condom during sexual contact  with a woman while taking this medicine and for at least 4 months after stopping it. A latex condom is needed even if you have had a vasectomy. Contact your doctor right away if your partner becomes pregnant. Do not donate sperm while taking this medicine and for at least 4 months after you stop taking this medicine. Men should inform their doctors if they wish to father a child. This medicine may lower sperm counts. What side effects may I notice from receiving this medication? Side effects that you should report to your doctor or health care professional as soon as possible: allergic reactions like skin rash, itching or hives, swelling of the face, lips, or tongue low blood counts - this medicine may decrease the number of white blood cells, red blood cells, and platelets. You may be at increased risk for infections and bleeding nausea, vomiting redness, blistering, peeling or loosening of the skin, including inside the mouth signs and symptoms of infection like fever; chills; cough; sore throat; pain or trouble passing urine signs and symptoms of  low red blood cells or anemia such as unusually weak or tired; feeling faint or lightheaded; falls; breathing problems unusual bruising or bleeding Side effects that usually do not require medical attention (report to your doctor or health care professional if they continue or are bothersome): changes in taste diarrhea hair loss loss of appetite mouth sores This list may not describe all possible side effects. Call your doctor for medical advice about side effects. You may report side effects to FDA at 1-800-FDA-1088. Where should I keep my medication? This drug is given in a hospital or clinic and will not be stored at home. NOTE: This sheet is a summary. It may not cover all possible information. If you have questions about this medicine, talk to your doctor, pharmacist, or health care provider.  2023 Elsevier/Gold Standard (2020-12-09 00:00:00) Rituximab Injection What is this medication? RITUXIMAB (ri TUX i mab) is a monoclonal antibody. It is used to treat certain types of cancer like non-Hodgkin lymphoma and chronic lymphocytic leukemia. It is also used to treat rheumatoid arthritis, granulomatosis with polyangiitis, microscopic polyangiitis, and pemphigus vulgaris. This medicine may be used for other purposes; ask your health care provider or pharmacist if you have questions. COMMON BRAND NAME(S): RIABNI, Rituxan, RUXIENCE, truxima What should I tell my care team before I take this medication? They need to know if you have any of these conditions: chest pain heart disease infection especially a viral infection such as chickenpox, cold sores, hepatitis B, or herpes immune system problems irregular heartbeat or rhythm kidney disease low blood counts (white cells, platelets, or red cells) lung disease recent or upcoming vaccine an unusual or allergic reaction to rituximab, other medicines, foods, dyes, or preservatives pregnant or trying to get pregnant breast-feeding How should  I use this medication? This medicine is injected into a vein. It is given by a health care provider in a hospital or clinic setting. A special MedGuide will be given to you before each treatment. Be sure to read this information carefully each time. Talk to your health care provider about the use of this medicine in children. While this drug may be prescribed for children as young as 6 months for selected conditions, precautions do apply. Overdosage: If you think you have taken too much of this medicine contact a poison control center or emergency room at once. NOTE: This medicine is only for you. Do not share this medicine with others. What if  I miss a dose? Keep appointments for follow-up doses. It is important not to miss your dose. Call your health care provider if you are unable to keep an appointment. What may interact with this medication? Do not take this medicine with any of the following medicines: live vaccines This medicine may also interact with the following medicines: cisplatin This list may not describe all possible interactions. Give your health care provider a list of all the medicines, herbs, non-prescription drugs, or dietary supplements you use. Also tell them if you smoke, drink alcohol, or use illegal drugs. Some items may interact with your medicine. What should I watch for while using this medication? Your condition will be monitored carefully while you are receiving this medicine. You may need blood work done while you are taking this medicine. This medicine can cause serious infusion reactions. To reduce the risk your health care provider may give you other medicines to take before receiving this one. Be sure to follow the directions from your health care provider. This medicine may increase your risk of getting an infection. Call your health care provider for advice if you get a fever, chills, sore throat, or other symptoms of a cold or flu. Do not treat yourself. Try to  avoid being around people who are sick. Call your health care provider if you are around anyone with measles, chickenpox, or if you develop sores or blisters that do not heal properly. Avoid taking medicines that contain aspirin, acetaminophen, ibuprofen, naproxen, or ketoprofen unless instructed by your health care provider. These medicines may hide a fever. This medicine may cause serious skin reactions. They can happen weeks to months after starting the medicine. Contact your health care provider right away if you notice fevers or flu-like symptoms with a rash. The rash may be red or purple and then turn into blisters or peeling of the skin. Or, you might notice a red rash with swelling of the face, lips or lymph nodes in your neck or under your arms. In some patients, this medicine may cause a serious brain infection that may cause death. If you have any problems seeing, thinking, speaking, walking, or standing, tell your healthcare professional right away. If you cannot reach your healthcare professional, urgently seek other source of medical care. Do not become pregnant while taking this medicine or for at least 12 months after stopping it. Women should inform their health care provider if they wish to become pregnant or think they might be pregnant. There is potential for serious harm to an unborn child. Talk to your health care provider for more information. Women should use a reliable form of birth control while taking this medicine and for 12 months after stopping it. Do not breast-feed while taking this medicine or for at least 6 months after stopping it. What side effects may I notice from receiving this medication? Side effects that you should report to your health care provider as soon as possible: allergic reactions (skin rash, itching or hives; swelling of the face, lips, or tongue) diarrhea edema (sudden weight gain; swelling of the ankles, feet, hands or other unusual swelling; trouble  breathing) fast, irregular heartbeat heart attack (trouble breathing; pain or tightness in the chest, neck, back or arms; unusually weak or tired) infection (fever, chills, cough, sore throat, pain or trouble passing urine) kidney injury (trouble passing urine or change in the amount of urine) liver injury (dark yellow or brown urine; general ill feeling or flu-like symptoms; loss of appetite, right  upper belly pain; unusually weak or tired, yellowing of the eyes or skin) low blood pressure (dizziness; feeling faint or lightheaded, falls; unusually weak or tired) low red blood cell counts (trouble breathing; feeling faint; lightheaded, falls; unusually weak or tired) mouth sores redness, blistering, peeling, or loosening of the skin, including inside the mouth stomach pain unusual bruising or bleeding wheezing (trouble breathing with loud or whistling sounds) vomiting Side effects that usually do not require medical attention (report to your health care provider if they continue or are bothersome): headache joint pain muscle cramps, pain nausea This list may not describe all possible side effects. Call your doctor for medical advice about side effects. You may report side effects to FDA at 1-800-FDA-1088. Where should I keep my medication? This medicine is given in a hospital or clinic. It will not be stored at home. NOTE: This sheet is a summary. It may not cover all possible information. If you have questions about this medicine, talk to your doctor, pharmacist, or health care provider.  2023 Elsevier/Gold Standard (2020-01-11 00:00:00)

## 2021-07-19 NOTE — Progress Notes (Signed)
Per Irene Limbo MD, Rituxan will be run as a First time infusion today with a Max rate of '300mg'$ /hr.

## 2021-07-20 ENCOUNTER — Inpatient Hospital Stay: Payer: BC Managed Care – PPO

## 2021-07-20 VITALS — BP 139/71 | HR 73 | Temp 98.2°F | Resp 18

## 2021-07-20 DIAGNOSIS — Z5111 Encounter for antineoplastic chemotherapy: Secondary | ICD-10-CM | POA: Diagnosis not present

## 2021-07-20 DIAGNOSIS — C8338 Diffuse large B-cell lymphoma, lymph nodes of multiple sites: Secondary | ICD-10-CM

## 2021-07-20 DIAGNOSIS — C8331 Diffuse large B-cell lymphoma, lymph nodes of head, face, and neck: Secondary | ICD-10-CM | POA: Diagnosis not present

## 2021-07-20 DIAGNOSIS — Z5112 Encounter for antineoplastic immunotherapy: Secondary | ICD-10-CM | POA: Diagnosis not present

## 2021-07-20 MED ORDER — SODIUM CHLORIDE 0.9% FLUSH
10.0000 mL | INTRAVENOUS | Status: DC | PRN
  Administered 2021-07-20: 10 mL

## 2021-07-20 MED ORDER — HEPARIN SOD (PORK) LOCK FLUSH 100 UNIT/ML IV SOLN
500.0000 [IU] | Freq: Once | INTRAVENOUS | Status: AC | PRN
  Administered 2021-07-20: 500 [IU]

## 2021-07-20 MED ORDER — SODIUM CHLORIDE 0.9 % IV SOLN
50.0000 mg/m2 | Freq: Once | INTRAVENOUS | Status: AC
  Administered 2021-07-20: 100 mg via INTRAVENOUS
  Filled 2021-07-20: qty 5

## 2021-07-20 MED ORDER — SODIUM CHLORIDE 0.9 % IV SOLN: Freq: Once | INTRAVENOUS | Status: AC

## 2021-07-20 MED ORDER — PROCHLORPERAZINE MALEATE 10 MG PO TABS
10.0000 mg | ORAL_TABLET | Freq: Once | ORAL | Status: AC
  Administered 2021-07-20: 10 mg via ORAL
  Filled 2021-07-20: qty 1

## 2021-07-20 NOTE — Progress Notes (Signed)
Per Dr Irene Limbo: During chemo infusion going forward pt can be treated as a subsequent pt versus first time. Rituxan Rate to run up to 300 mg/hr.

## 2021-07-20 NOTE — Patient Instructions (Signed)
[facility] MEDICAL ONCOLOGY  Discharge Instructions: Thank you for choosing [facility] to provide your oncology and hematology care.   If you have a lab appointment with the Cancer Center, please go directly to the Cancer Center and check in at the registration area.   Wear comfortable clothing and clothing appropriate for easy access to any Portacath or PICC line.   We strive to give you quality time with your provider. You may need to reschedule your appointment if you arrive late (15 or more minutes).  Arriving late affects you and other patients whose appointments are after yours.  Also, if you miss three or more appointments without notifying the office, you may be dismissed from the clinic at the provider's discretion.      For prescription refill requests, have your pharmacy contact our office and allow 72 hours for refills to be completed.    Today you received the following chemotherapy and/or immunotherapy agents etoposide      To help prevent nausea and vomiting after your treatment, we encourage you to take your nausea medication as directed.  BELOW ARE SYMPTOMS THAT SHOULD BE REPORTED IMMEDIATELY: *FEVER GREATER THAN 100.4 F (38 C) OR HIGHER *CHILLS OR SWEATING *NAUSEA AND VOMITING THAT IS NOT CONTROLLED WITH YOUR NAUSEA MEDICATION *UNUSUAL SHORTNESS OF BREATH *UNUSUAL BRUISING OR BLEEDING *URINARY PROBLEMS (pain or burning when urinating, or frequent urination) *BOWEL PROBLEMS (unusual diarrhea, constipation, pain near the anus) TENDERNESS IN MOUTH AND THROAT WITH OR WITHOUT PRESENCE OF ULCERS (sore throat, sores in mouth, or a toothache) UNUSUAL RASH, SWELLING OR PAIN  UNUSUAL VAGINAL DISCHARGE OR ITCHING   Items with * indicate a potential emergency and should be followed up as soon as possible or go to the Emergency Department if any problems should occur.  Please show the CHEMOTHERAPY ALERT CARD or IMMUNOTHERAPY ALERT CARD at check-in to  the Emergency Department and triage nurse.  Should you have questions after your visit or need to cancel or reschedule your appointment, please contact [facility]ICAL [facility]COL[facility]  Dept: [phone]  and follow the prompts.  Office hours are 8:00 a.m. to 4:30 p.m. Monday - Friday. Please note that voicemails left after 4:00 p.m. may not be returned until the following business day.  We are closed weekends and major holidays. You have access to a nurse at all times for urgent questions. Please call the main number to the clinic Dept: [phone] and follow the prompts.   For any non-urgent questions, you may also contact your provider using MyChart. We now offer e-Visits for anyone 18 and older to request care online for non-urgent symptoms. For details visit [email or web address].   Also download the MyChart app! Go to the app store, search "MyChart", open the app, select , and log in with your MyChart username and password.  Masks are optional in the cancer centers. If you would like for your care team to wear a mask while they are taking care of you, please let them know. For doctor visits, patients may have with them one support person who is at least 68 years old. At this time, visitors are not allowed in the infusion area. 

## 2021-07-21 ENCOUNTER — Other Ambulatory Visit: Payer: Self-pay

## 2021-07-21 ENCOUNTER — Inpatient Hospital Stay: Payer: BC Managed Care – PPO

## 2021-07-21 VITALS — BP 144/75 | HR 89 | Temp 98.8°F | Resp 18

## 2021-07-21 DIAGNOSIS — C8338 Diffuse large B-cell lymphoma, lymph nodes of multiple sites: Secondary | ICD-10-CM

## 2021-07-21 DIAGNOSIS — Z5111 Encounter for antineoplastic chemotherapy: Secondary | ICD-10-CM | POA: Diagnosis not present

## 2021-07-21 DIAGNOSIS — C8331 Diffuse large B-cell lymphoma, lymph nodes of head, face, and neck: Secondary | ICD-10-CM | POA: Diagnosis not present

## 2021-07-21 DIAGNOSIS — Z5112 Encounter for antineoplastic immunotherapy: Secondary | ICD-10-CM | POA: Diagnosis not present

## 2021-07-21 MED ORDER — SODIUM CHLORIDE 0.9% FLUSH
10.0000 mL | INTRAVENOUS | Status: DC | PRN
  Administered 2021-07-21: 10 mL

## 2021-07-21 MED ORDER — HEPARIN SOD (PORK) LOCK FLUSH 100 UNIT/ML IV SOLN
500.0000 [IU] | Freq: Once | INTRAVENOUS | Status: AC | PRN
  Administered 2021-07-21: 500 [IU]

## 2021-07-21 MED ORDER — SODIUM CHLORIDE 0.9 % IV SOLN
50.0000 mg/m2 | Freq: Once | INTRAVENOUS | Status: AC
  Administered 2021-07-21: 100 mg via INTRAVENOUS
  Filled 2021-07-21: qty 5

## 2021-07-21 MED ORDER — PROCHLORPERAZINE MALEATE 10 MG PO TABS
10.0000 mg | ORAL_TABLET | Freq: Once | ORAL | Status: AC
  Administered 2021-07-21: 10 mg via ORAL
  Filled 2021-07-21: qty 1

## 2021-07-21 MED ORDER — SODIUM CHLORIDE 0.9 % IV SOLN: Freq: Once | INTRAVENOUS | Status: AC

## 2021-07-25 NOTE — Progress Notes (Signed)
[facility]   HEMATOLOGY/ONCOLOGY CLINIC NOTE  Date of Service: 07/19/2021    Patient Care Team: Rosine Door as PCP - General (Physician Assistant)  CHIEF COMPLAINTS/PURPOSE OF CONSULTATION:  Follow-up for continued evaluation and management of diffuse large B-cell lymphoma  HISTORY OF PRESENTING ILLNESS:  Please see previous note for details on initial presentation  INTERVAL HISTORY:  Barbara Rhodes is a 66 y.o. female here for follow-up prior to cycle 3-day 1 of chemotherapy and cycle 2 of R CEOP chemotherapy. She notes no acute new symptoms since her last clinic visit.  No uncontrolled nausea or vomiting. No change in neuropathic symptoms. No new fatigue. No fevers or chills. No abnormal bleeding or bruising. No new lumps or bumps. Patient keen to continue chemoimmunotherapy and is eager to see the results on her PET CT scan. She continues to stay physically active and has been using pool therapy effectively. Labs done today were discussed in detail with the patient.  MEDICAL HISTORY:  Past Medical History:  Diagnosis Date   Arthritis    Breast cancer ([place]) 2000   Stage 2B, ER+/PR-/Her2-   Colon polyps    Family history of adverse reaction to anesthesia 24   Brother passed away at 82 years old from malignant hyperthermia.   Family history of breast cancer    Family history of prostate cancer    Hypertension     SURGICAL HISTORY: Past Surgical History:  Procedure Laterality Date   ABDOMINAL HYSTERECTOMY     BACK SURGERY     BREAST SURGERY     COLONOSCOPY WITH PROPOFOL     pt said propofol burned her throat when pushed   IR US GUIDE BX ASP/DRAIN  04/03/2021   LYMPH NODE BIOPSY Left 06/02/2021   Procedure: EXCISIONAL CERVICAL LYMPH NODE BIOPSY;  Surgeon: Jovita Kussmaul, MD;  Location: [facility];  Service: General;  Laterality: Left;   MASTECTOMY Right 2000   MASTECTOMY Right 2000   PORTACATH PLACEMENT Right 06/02/2021   Procedure: INSERTION  PORT-A-CATH;  Surgeon: Jovita Kussmaul, MD;  Location: [facility];  Service: General;  Laterality: Right;   POSTERIOR CERVICAL LAMINECTOMY Left 01/23/2018   Procedure: LEFT CERVICAL SEVEN- [place], MICRODISCECTOMY;  Surgeon: Jovita Gamma, MD;  Location: [place];  Service: Neurosurgery;  Laterality: Left;   ROTATOR CUFF REPAIR     TONSILLECTOMY     TRANSFORAMINAL LUMBAR INTERBODY FUSION (TLIF) WITH PEDICLE SCREW FIXATION 2 LEVEL Right 10/30/2019   Procedure: Right Lumbar 3-4 Lumbar 4-5 Transforaminal lumbar interbody fusion;  Surgeon: Erline Levine, MD;  Location: [place];  Service: Neurosurgery;  Laterality: Right;  3C/RM 21   TUBAL LIGATION     WISDOM TOOTH EXTRACTION      SOCIAL HISTORY: Social History   Socioeconomic History   Marital status: Married    Spouse name: Not on file   Number of children: Not on file   Years of education: Not on file   Highest education level: Not on file  Occupational History   Not on file  Tobacco Use   Smoking status: Never   Smokeless tobacco: Never  Vaping Use   Vaping Use: Never used  Substance and Sexual Activity   Alcohol use: No   Drug use: No   Sexual activity: Not on file  Other Topics Concern   Not on file  Social History Narrative   Not on file   Social Determinants of Health   Financial Resource Strain: Not on file  Food  Insecurity: Not on file  Transportation Needs: Not on file  Physical Activity: Not on file  Stress: Not on file  Social Connections: Not on file  Intimate Partner Violence: Not on file    FAMILY HISTORY: Family History  Problem Relation Age of Onset   Breast cancer Paternal Grandmother 9   Prostate cancer Father        dx in his 52s   Colon polyps Father 72   Dementia Mother    Hypertension Mother    Dementia Maternal Aunt    Stroke Maternal Grandfather    Rheum arthritis Paternal Grandfather    Breast cancer Other        MGM's sister, post-menopausal breast cancer   Breast  cancer Cousin 45       paternal 2nd cousin    ALLERGIES:  is allergic to penicillins and rituximab-pvvr.  MEDICATIONS:  Current Outpatient Medications  Medication Sig Dispense Refill   acetaminophen (TYLENOL) 500 MG tablet Take 1,000 mg by mouth daily as needed for mild pain or moderate pain.     allopurinol (ZYLOPRIM) 300 MG tablet Take 1 tablet (300 mg total) by mouth daily. 30 tablet 1   B Complex-C (B-COMPLEX WITH VITAMIN C) tablet Take 1 tablet by mouth daily.     celecoxib (CELEBREX) 200 MG capsule Restart in 5 days (Patient taking differently: Take 200 mg by mouth daily.) 1 capsule 0   Cholecalciferol (VITAMIN D3) 50 MCG (2000 UT) capsule Take 4,000 Units by mouth daily.     docusate sodium (COLACE) 100 MG capsule Take 100 mg by mouth daily as needed for mild constipation.     lidocaine-prilocaine (EMLA) cream Apply to affected area once (Patient not taking: Reported on 06/01/2021) 30 g 3   LORazepam (ATIVAN) 0.5 MG tablet Take 1 tablet (0.5 mg total) by mouth every 6 (six) hours as needed (Nausea or vomiting). (Patient not taking: Reported on 06/01/2021) 30 tablet 0   Magnesium 250 MG TABS Take 250 mg by mouth daily.     Omega-3 Fatty Acids (FISH OIL ULTRA) 1400 MG CAPS Take 1,400 mg by mouth 3 (three) times a week.     ondansetron (ZOFRAN) 8 MG tablet Take 1 tablet (8 mg total) by mouth 2 (two) times daily as needed for refractory nausea / vomiting. Start on day 3 after cyclophosphamide chemotherapy. 30 tablet 1   oxyCODONE (OXY IR/ROXICODONE) 5 MG immediate release tablet Take 1 tablet (5 mg total) by mouth every 4 (four) hours as needed for moderate pain. 30 tablet 0   pantoprazole (PROTONIX) 40 MG tablet Take 1 tablet (40 mg total) by mouth at bedtime. 30 tablet 3   pegfilgrastim-cbqv (UDENYCA) 6 MG/0.6ML injection 55m [place] x 1 dose -- 24 to 48 hours after completion of each cycle of chemotherapy. 0.6 mL 4   polyethylene glycol (MIRALAX / GLYCOLAX) 17 g packet Take 17 g by mouth daily.  14 each 0   Potassium 99 MG TABS Take 99 mg by mouth daily.     potassium chloride SA (KLOR-CON M) 20 MEQ tablet Take 1 tablet (20 mEq total) by mouth daily. (Patient not taking: Reported on 07/10/2021) 7 tablet 0   predniSONE (DELTASONE) 20 MG tablet Take 5 tablets (100 mg total) by mouth daily. Take with food on days 1-5 of chemotherapy. 25 tablet 5   prochlorperazine (COMPAZINE) 10 MG tablet Take 1 tablet (10 mg total) by mouth every 6 (six) hours as needed (Nausea or vomiting). 30 tablet 6  traMADol (ULTRAM) 50 MG tablet Take 50 mg by mouth daily as needed for moderate pain. (Patient not taking: Reported on 07/10/2021)     triamterene-hydrochlorothiazide (MAXZIDE-25) 37.5-25 MG tablet Take 1 tablet by mouth daily.      vitamin B-12 (CYANOCOBALAMIN) 100 MCG tablet Take 100 mcg by mouth daily. 2 daily     No current facility-administered medications for this visit.    REVIEW OF SYSTEMS:    10 Point review of Systems was done is negative except as noted above.  PHYSICAL EXAMINATION: ECOG PERFORMANCE STATUS: 1 - Symptomatic but completely ambulatory NAD GENERAL:alert, in no acute distress and comfortable SKIN: no acute rashes, no significant lesions EYES: conjunctiva are pink and non-injected, sclera anicteric OROPHARYNX: MMM, no exudates, no oropharyngeal erythema or ulceration NECK: supple, no JVD LYMPH:  no palpable lymphadenopathy in the cervical, axillary or inguinal regions LUNGS: clear to auscultation b/l with normal respiratory effort HEART: regular rate & rhythm ABDOMEN:  normoactive bowel sounds , non tender, not distended. Extremity: no pedal edema PSYCH: alert & oriented x 3 with fluent speech NEURO: no focal motor/sensory deficits  LABORATORY DATA:  I have reviewed the data as listed .    Latest Ref Rng & Units 07/19/2021    7:33 AM 07/10/2021   12:50 PM 07/03/2021    7:58 AM  CBC  WBC 4.0 - 10.5 K/uL 8.9  7.2  29.8   Hemoglobin 12.0 - 15.0 g/dL 10.8  10.5  10.3    Hematocrit 36.0 - 46.0 % 31.6  31.6  29.9   Platelets 150 - 400 K/uL 332  165  336    .    Latest Ref Rng & Units 07/19/2021    7:33 AM 07/10/2021   12:50 PM 07/03/2021    7:58 AM  CMP  Glucose 70 - 99 mg/dL 106  162  82   BUN 8 - 23 mg/dL 22  15  24    Creatinine 0.44 - 1.00 mg/dL 0.79  0.82  0.78   Sodium 135 - 145 mmol/L 141  140  139   Potassium 3.5 - 5.1 mmol/L 3.4  3.3  3.4   Chloride 98 - 111 mmol/L 107  103  105   CO2 22 - 32 mmol/L 24  26  26    Calcium 8.9 - 10.3 mg/dL 9.7  9.5  9.2   Total Protein 6.5 - 8.1 g/dL 6.7  6.4  6.0   Total Bilirubin 0.3 - 1.2 mg/dL 0.3  0.3  0.5   Alkaline Phos 38 - 126 U/L 82  99  78   AST 15 - 41 U/L 16  24  13    ALT 0 - 44 U/L 23  32  22    . Lab Results  Component Value Date   LDH 220 (H) 07/19/2021    SURGICAL PATHOLOGY  CASE: [id number]  PATIENT: Floraine Glad  Surgical Pathology Report      Clinical History: remote history of breast cancer, now with  indeterminate left supraclavicular lymphadenopathy (cm)      FINAL MICROSCOPIC DIAGNOSIS:   A. LYMPH NODE, LEFT SUPRACLAVICULAR, NEEDLE CORE BIOPSY:  -Atypical lymphoid proliferation  -See comment   COMMENT:   The sections show several very small needle core biopsy fragments of  apparently lymph nodal tissue displaying an atypical lymphoid  proliferation of primarily large lymphoid appearing cells characterized  by vesicular chromatin and prominent nucleoli associated with scattered  mitosis.  The appearance is apparently diffuse with lack of atypical  follicles.  Flow cytometric analysis was performed [facility] [id number]) and  shows predominance of T lymphocytes with nonspecific changes.  No  monoclonal B-cell population identified.  A battery of  immunohistochemical stains was performed but the tissue is prominently  exhausted on deeper sectioning.  Scattered residual very minute  fragments show that the lymphoid appearing cells are positive for LCA,  CD20, CD79a,  BCL6, MUM1, and Bcl-2.  No significant staining is seen  with CD138, CD10, CD34, cyclin D1, S100, cytokeratin AE1/AE3,  cytokeratin 8/18, or GATA3.  There is an admixed T-cell population to a  lesser extent as seen with CD3 and CD5 and there is no apparent  co-expression of CD5 in B-cell areas.  The following stains were  performed but there is no optimal tissue present on deeper sectioning  for evaluation including CD30, TdT, and in situ hybridization for kappa,  lambda and EBV.  The overall findings are limited but very concerning  for involvement by a B-cell lymphoproliferative process particularly  large cell lymphoma.  Excisional biopsy is recommended to further  evaluate this process.   SURGICAL PATHOLOGY  CASE: [id number]  PATIENT: Berkley Harvey  Surgical Pathology Report      Clinical History: Cervical lymphocyte; history of Hodgkin's lymphoma  ([facility])    FINAL MICROSCOPIC DIAGNOSIS:   A. LYMPH NODE, LEFT NECK, EXCISION:   -  Diffuse large B-cell lymphoma, activated B-cell subtype  -  See comment   COMMENT:   The excisional biopsy consists of an enlarged lymph node with complete  effacement by a large pleomorphic population of lymphoid cells with  vesicular chromatin and prominent nucleoli.  By immunohistochemistry,  the atypical lymphocytes are positive for CD20, PAX5, Bcl-2, BCL6 and  Mum-1.  The cells are negative for CD5, CD10, CD30 and EBV by in situ  hybridization.  The proliferative rate by Ki-67 is 60-70%.  CD3  highlights background benign-appearing lymphocytes.  Overall, the  findings are consistent with a diffuse large B cell lymphoma, activated  B-cell phenotype.  Preliminary results of this case were discussed with  Dr. Jenell Milliner on Jun 06, 2021.  FISH studies (high-grade/large B-cell  lymphoma) are pending and will be reported in an addendum.   RADIOGRAPHIC STUDIES: I have personally reviewed the radiological images as listed and agreed with the  findings in the report. No results found.  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, sports with previous history of right-sided breast cancer status post right mastectomy and adjuvant chemotherapy including doxorubicin now with  #1 stage III/IV diffuse large B-cell lymphoma.-Activated B-cell subtype High risk lymphoma FISH panel no evidence of double or triple hit lymphoma Patient received cycle 1 of R-CVP  #2 anemia due to lymphoma involvement of bone marrow #3 status post Port-A-Cath placement #4 thrombocytopenia secondary to #1 #5 high risk of tumor lysis syndrome #6 anxiety #7 remote history of right breast cancer status postmastectomy and adjuvant chemotherapy #8 rigors with Rituxan dose being At 200 mg/h  Plan -Labs done today were discussed in detail with the patient. CBC and CMP stable LDH slightly elevated likely from growth factor support No new toxicities from her current chemoimmunotherapy with R-CEOP. We will continue the same regimen with same supportive medications at this time.  We ran the Rituxan today as first time but Rituxan rate was advanced to 300 mg/h and patient tolerated this okay. With next cycle of Rituxan we will do this as a subsequent Rituxan infusion with peak rates of 300 mg/h with same premedications.  Patient will continue Udenyca growth factor support at home on day 5 of each cycle of treatment. -Continue to premedicate with her prednisone for 2 days prior to her treatment and take the remaining prednisone for 3 days after her treatment with additional famotidine and an antihistamine to reduce risk of Rituxan related reactions. -Continue reduced dosage vincristine 1 mg cap. -PET CT scan as scheduled on 07/31/2021   Follow-up PET CT scan is scheduled on 07/31/2021 Follow-up as per scheduled appointments for next treatment in 3 weeks port flush labs and MD visit on day 1 of each cycle of treatment.    The total time spent in the appointment was 32  minutes*.  All of the patient's questions were answered with apparent satisfaction. The patient knows to call the clinic with any problems, questions or concerns.   Sullivan Lone MD MS AAHIVMS [facility] Hematology/Oncology Physician [facility]  .*Total Encounter Time as defined by the Centers for Medicare and Medicaid Services includes, in addition to the face-to-face time of a patient visit (documented in the note above) non-face-to-face time: obtaining and reviewing outside history, ordering and reviewing medications, tests or procedures, care coordination (communications with other health care professionals or caregivers) and documentation in the medical record.

## 2021-07-26 ENCOUNTER — Encounter: Payer: Self-pay | Admitting: Hematology

## 2021-07-31 ENCOUNTER — Ambulatory Visit (HOSPITAL_COMMUNITY)
Admission: RE | Admit: 2021-07-31 | Discharge: 2021-07-31 | Disposition: A | Discharge status: Home / Self Care | Payer: BC Managed Care – PPO | Source: Ambulatory Visit | Attending: Hematology | Admitting: Hematology

## 2021-07-31 DIAGNOSIS — C8338 Diffuse large B-cell lymphoma, lymph nodes of multiple sites: Secondary | ICD-10-CM | POA: Diagnosis not present

## 2021-07-31 DIAGNOSIS — R918 Other nonspecific abnormal finding of lung field: Secondary | ICD-10-CM | POA: Diagnosis not present

## 2021-07-31 DIAGNOSIS — C8330 Diffuse large B-cell lymphoma, unspecified site: Secondary | ICD-10-CM | POA: Diagnosis not present

## 2021-07-31 DIAGNOSIS — C50911 Malignant neoplasm of unspecified site of right female breast: Secondary | ICD-10-CM | POA: Diagnosis not present

## 2021-07-31 LAB — GLUCOSE, CAPILLARY: Glucose-Capillary: 108 mg/dL — ABNORMAL HIGH (ref 70–99)

## 2021-07-31 MED ORDER — FLUDEOXYGLUCOSE F - 18 (FDG) INJECTION
10.1300 | Freq: Once | INTRAVENOUS | Status: AC
  Administered 2021-07-31: 10.13 via INTRAVENOUS

## 2021-08-01 ENCOUNTER — Ambulatory Visit (INDEPENDENT_AMBULATORY_CARE_PROVIDER_SITE_OTHER): Payer: BC Managed Care – PPO | Admitting: Podiatry

## 2021-08-01 ENCOUNTER — Encounter: Payer: Self-pay | Admitting: Podiatry

## 2021-08-01 DIAGNOSIS — M79675 Pain in left toe(s): Secondary | ICD-10-CM

## 2021-08-01 DIAGNOSIS — M79674 Pain in right toe(s): Secondary | ICD-10-CM

## 2021-08-01 DIAGNOSIS — B351 Tinea unguium: Secondary | ICD-10-CM

## 2021-08-02 ENCOUNTER — Other Ambulatory Visit: Payer: Self-pay | Admitting: Hematology

## 2021-08-06 NOTE — Progress Notes (Signed)
  Subjective:  Patient ID: Barbara Rhodes, female    DOB: September 23, 1955,  MRN: [id number]  Barbara Rhodes presents to clinic today for at risk foot care with h/o neuropathy secondary to chemotherapy and painful thick toenails that are difficult to trim. Pain interferes with ambulation. Aggravating factors include wearing enclosed shoe gear. Pain is relieved with periodic professional debridement.  New problem(s): None.   PCP is Rosine Door , and last visit was March, 2023.  Allergies  Allergen Reactions   Penicillins Anaphylaxis    DID THE REACTION INVOLVE: Swelling of the face/tongue/throat, SOB, or low BP? Yes Sudden or severe rash/hives, skin peeling, or the inside of the mouth or nose? No Did it require medical treatment? Yes When did it last happen?      63 years ago If all above answers are "NO", may proceed with cephalosporin use.    Rituximab-Pvvr Other (See Comments)    chills and rigors    Review of Systems: Negative except as noted in the HPI.  Objective: No changes noted in today's physical examination. Objective:   Vascular Examination: Vascular status intact b/l with palpable pedal pulses. Pedal hair present b/l. CFT immediate b/l. No edema. No pain with calf compression b/l. Skin temperature gradient WNL b/l. Pedal hair present. No pain with calf compression b/l.  Neurological Examination: Protective sensation diminished with 10g monofilament b/l.  Dermatological Examination: Pedal skin with normal turgor, texture and tone b/l. Toenails 1-5 b/l thick, discolored, elongated with subungual debris and pain on dorsal palpation. No hyperkeratotic lesions noted b/l.   Musculoskeletal Examination: Muscle strength 5/5 to b/l LE. Limited joint ROM to the 1st MPJ b/l.  Radiographs: None  Assessment/Plan: 1. Pain due to onychomycosis of toenails of both feet   -Patient was evaluated and treated. All patient's and/or POA's  questions/concerns answered on today's visit. -Patient to continue soft, supportive shoe gear daily. -Mycotic toenails 1-5 bilaterally were debrided in length and girth with sterile nail nippers and dremel without incident. -Patient/POA to call should there be question/concern in the interim.   Return in about 9 weeks (around 10/03/2021).  Marzetta Board, DPM

## 2021-08-08 ENCOUNTER — Other Ambulatory Visit: Payer: Self-pay

## 2021-08-08 DIAGNOSIS — C8338 Diffuse large B-cell lymphoma, lymph nodes of multiple sites: Secondary | ICD-10-CM

## 2021-08-09 ENCOUNTER — Ambulatory Visit: Payer: BC Managed Care – PPO | Admitting: Physician Assistant

## 2021-08-09 ENCOUNTER — Ambulatory Visit: Payer: BC Managed Care – PPO

## 2021-08-09 ENCOUNTER — Inpatient Hospital Stay (HOSPITAL_BASED_OUTPATIENT_CLINIC_OR_DEPARTMENT_OTHER): Payer: BC Managed Care – PPO | Admitting: Physician Assistant

## 2021-08-09 ENCOUNTER — Other Ambulatory Visit: Payer: Self-pay

## 2021-08-09 ENCOUNTER — Inpatient Hospital Stay: Payer: BC Managed Care – PPO

## 2021-08-09 ENCOUNTER — Inpatient Hospital Stay: Payer: BC Managed Care – PPO | Attending: Hematology and Oncology

## 2021-08-09 ENCOUNTER — Other Ambulatory Visit: Payer: BC Managed Care – PPO

## 2021-08-09 VITALS — BP 120/65 | HR 72 | Temp 97.8°F | Resp 18 | Wt 210.0 lb

## 2021-08-09 DIAGNOSIS — Z95828 Presence of other vascular implants and grafts: Secondary | ICD-10-CM

## 2021-08-09 DIAGNOSIS — Z5112 Encounter for antineoplastic immunotherapy: Secondary | ICD-10-CM | POA: Insufficient documentation

## 2021-08-09 DIAGNOSIS — C8338 Diffuse large B-cell lymphoma, lymph nodes of multiple sites: Secondary | ICD-10-CM | POA: Diagnosis not present

## 2021-08-09 DIAGNOSIS — Z5111 Encounter for antineoplastic chemotherapy: Secondary | ICD-10-CM | POA: Insufficient documentation

## 2021-08-09 DIAGNOSIS — C8331 Diffuse large B-cell lymphoma, lymph nodes of head, face, and neck: Secondary | ICD-10-CM | POA: Diagnosis not present

## 2021-08-09 LAB — CBC WITH DIFFERENTIAL (CANCER CENTER ONLY)
Abs Immature Granulocytes: 0.04 10*3/uL (ref 0.00–0.07)
Basophils Absolute: 0 10*3/uL (ref 0.0–0.1)
Basophils Relative: 1 %
Eosinophils Absolute: 0 10*3/uL (ref 0.0–0.5)
Eosinophils Relative: 0 %
HCT: 33.6 % — ABNORMAL LOW (ref 36.0–46.0)
Hemoglobin: 11.7 g/dL — ABNORMAL LOW (ref 12.0–15.0)
Immature Granulocytes: 1 %
Lymphocytes Relative: 33 %
Lymphs Abs: 2.3 10*3/uL (ref 0.7–4.0)
MCH: 33 pg (ref 26.0–34.0)
MCHC: 34.8 g/dL (ref 30.0–36.0)
MCV: 94.6 fL (ref 80.0–100.0)
Monocytes Absolute: 0.9 10*3/uL (ref 0.1–1.0)
Monocytes Relative: 13 %
Neutro Abs: 3.7 10*3/uL (ref 1.7–7.7)
Neutrophils Relative %: 52 %
Platelet Count: 350 10*3/uL (ref 150–400)
RBC: 3.55 MIL/uL — ABNORMAL LOW (ref 3.87–5.11)
RDW: 16.8 % — ABNORMAL HIGH (ref 11.5–15.5)
WBC Count: 7 10*3/uL (ref 4.0–10.5)
nRBC: 0 % (ref 0.0–0.2)

## 2021-08-09 LAB — CMP (CANCER CENTER ONLY)
ALT: 30 U/L (ref 0–44)
AST: 17 U/L (ref 15–41)
Albumin: 4.3 g/dL (ref 3.5–5.0)
Alkaline Phosphatase: 80 U/L (ref 38–126)
Anion gap: 8 (ref 5–15)
BUN: 23 mg/dL (ref 8–23)
CO2: 28 mmol/L (ref 22–32)
Calcium: 9.9 mg/dL (ref 8.9–10.3)
Chloride: 104 mmol/L (ref 98–111)
Creatinine: 0.77 mg/dL (ref 0.44–1.00)
GFR, Estimated: >60 mL/min (ref >60)
Glucose, Bld: 94 mg/dL (ref 70–99)
Potassium: 3.6 mmol/L (ref 3.5–5.1)
Sodium: 140 mmol/L (ref 135–145)
Total Bilirubin: 0.4 mg/dL (ref 0.3–1.2)
Total Protein: 6.6 g/dL (ref 6.5–8.1)

## 2021-08-09 LAB — LACTATE DEHYDROGENASE: LDH: 215 U/L — ABNORMAL HIGH (ref 98–192)

## 2021-08-09 MED ORDER — SODIUM CHLORIDE 0.9 % IV SOLN
750.0000 mg/m2 | Freq: Once | INTRAVENOUS | Status: AC
  Administered 2021-08-09: 1560 mg via INTRAVENOUS
  Filled 2021-08-09: qty 78

## 2021-08-09 MED ORDER — SODIUM CHLORIDE 0.9 % IV SOLN: Freq: Once | INTRAVENOUS | Status: AC

## 2021-08-09 MED ORDER — MONTELUKAST SODIUM 10 MG PO TABS
10.0000 mg | ORAL_TABLET | Freq: Once | ORAL | Status: AC
  Administered 2021-08-09: 10 mg via ORAL

## 2021-08-09 MED ORDER — SODIUM CHLORIDE 0.9% FLUSH
10.0000 mL | INTRAVENOUS | Status: DC | PRN
  Administered 2021-08-09: 10 mL

## 2021-08-09 MED ORDER — METHYLPREDNISOLONE SODIUM SUCC 125 MG IJ SOLR
125.0000 mg | Freq: Every day | INTRAMUSCULAR | Status: DC
  Filled 2021-08-09: qty 2

## 2021-08-09 MED ORDER — DIPHENHYDRAMINE HCL 25 MG PO CAPS
50.0000 mg | ORAL_CAPSULE | Freq: Once | ORAL | Status: AC
  Administered 2021-08-09: 50 mg via ORAL
  Filled 2021-08-09: qty 2

## 2021-08-09 MED ORDER — VINCRISTINE SULFATE CHEMO INJECTION 1 MG/ML
1.0000 mg | Freq: Once | INTRAVENOUS | Status: AC
  Administered 2021-08-09: 1 mg via INTRAVENOUS
  Filled 2021-08-09: qty 1

## 2021-08-09 MED ORDER — FAMOTIDINE IN NACL 20-0.9 MG/50ML-% IV SOLN
20.0000 mg | Freq: Once | INTRAVENOUS | Status: AC
  Administered 2021-08-09: 20 mg via INTRAVENOUS
  Filled 2021-08-09: qty 50

## 2021-08-09 MED ORDER — SODIUM CHLORIDE 0.9 % IV SOLN
375.0000 mg/m2 | Freq: Once | INTRAVENOUS | Status: AC
  Administered 2021-08-09: 800 mg via INTRAVENOUS
  Filled 2021-08-09: qty 30

## 2021-08-09 MED ORDER — SODIUM CHLORIDE 0.9% FLUSH
10.0000 mL | Freq: Once | INTRAVENOUS | Status: AC
  Administered 2021-08-09: 10 mL

## 2021-08-09 MED ORDER — SODIUM CHLORIDE 0.9 % IV SOLN
50.0000 mg/m2 | Freq: Once | INTRAVENOUS | Status: AC
  Administered 2021-08-09: 100 mg via INTRAVENOUS
  Filled 2021-08-09: qty 5

## 2021-08-09 MED ORDER — METHYLPREDNISOLONE SODIUM SUCC 125 MG IJ SOLR
125.0000 mg | Freq: Once | INTRAMUSCULAR | Status: AC
  Administered 2021-08-09: 125 mg via INTRAVENOUS

## 2021-08-09 MED ORDER — HEPARIN SOD (PORK) LOCK FLUSH 100 UNIT/ML IV SOLN
500.0000 [IU] | Freq: Once | INTRAVENOUS | Status: AC | PRN
  Administered 2021-08-09: 500 [IU]

## 2021-08-09 MED ORDER — MEPERIDINE HCL 25 MG/ML IJ SOLN
25.0000 mg | Freq: Once | INTRAMUSCULAR | Status: AC
  Administered 2021-08-09: 25 mg via INTRAVENOUS
  Filled 2021-08-09: qty 1

## 2021-08-09 MED ORDER — MONTELUKAST SODIUM 10 MG PO TABS
10.0000 mg | ORAL_TABLET | Freq: Every day | ORAL | Status: DC
  Filled 2021-08-09: qty 1

## 2021-08-09 MED ORDER — ACETAMINOPHEN 325 MG PO TABS
650.0000 mg | ORAL_TABLET | Freq: Once | ORAL | Status: AC
  Administered 2021-08-09: 650 mg via ORAL
  Filled 2021-08-09: qty 2

## 2021-08-09 MED ORDER — PALONOSETRON HCL INJECTION 0.25 MG/5ML
0.2500 mg | Freq: Once | INTRAVENOUS | Status: AC
  Administered 2021-08-09: 0.25 mg via INTRAVENOUS
  Filled 2021-08-09: qty 5

## 2021-08-09 NOTE — Patient Instructions (Signed)
[facility] ONCOL[facility]  Discharge Instructions: Thank you for choosing [facility] to provide your oncology and hematology care.   If you have a lab appointment with the [facility], please go directly to the [facility] and check in at the registration area.   Wear comfortable clothing and clothing appropriate for easy access to any Portacath or PICC line.   We strive to give you quality time with your provider. You may need to reschedule your appointment if you arrive late (15 or more minutes).  Arriving late affects you and other patients whose appointments are after yours.  Also, if you miss three or more appointments without notifying the office, you may be dismissed from the clinic at the provider's discretion.      For prescription refill requests, have your pharmacy contact our office and allow 72 hours for refills to be completed.    Today you received the following chemotherapy and/or immunotherapy agents: Vincristine, Cytoxan, Etoposide, and Rituximab      To help prevent nausea and vomiting after your treatment, we encourage you to take your nausea medication as directed.  BELOW ARE SYMPTOMS THAT SHOULD BE REPORTED IMMEDIATELY: *FEVER GREATER THAN 100.4 F (38 C) OR HIGHER *CHILLS OR SWEATING *NAUSEA AND VOMITING THAT IS NOT CONTROLLED WITH YOUR NAUSEA MEDICATION *UNUSUAL SHORTNESS OF BREATH *UNUSUAL BRUISING OR BLEEDING *URINARY PROBLEMS (pain or burning when urinating, or frequent urination) *BOWEL PROBLEMS (unusual diarrhea, constipation, pain near the anus) TENDERNESS IN MOUTH AND THROAT WITH OR WITHOUT PRESENCE OF ULCERS (sore throat, sores in mouth, or a toothache) UNUSUAL RASH, SWELLING OR PAIN  UNUSUAL VAGINAL DISCHARGE OR ITCHING   Items with * indicate a potential emergency and should be followed up as soon as possible or go to the Emergency Department if any problems should occur.  Please show the CHEMOTHERAPY ALERT CARD or  IMMUNOTHERAPY ALERT CARD at check-in to the Emergency Department and triage nurse.  Should you have questions after your visit or need to cancel or reschedule your appointment, please contact [facility]  Dept: [phone]  and follow the prompts.  Office hours are 8:00 a.m. to 4:30 p.m. Monday - Friday. Please note that voicemails left after 4:00 p.m. may not be returned until the following business day.  We are closed weekends and major holidays. You have access to a nurse at all times for urgent questions. Please call the main number to the clinic Dept: [phone] and follow the prompts.   For any non-urgent questions, you may also contact your provider using MyChart. We now offer e-Visits for anyone 26 and older to request care online for non-urgent symptoms. For details visit [email or web address].   Also download the MyChart app! Go to the app store, search "MyChart", open the app, select [facility], and log in with your MyChart username and password.  Due to Covid, a mask is required upon entering the hospital/clinic. If you do not have a mask, one will be given to you upon arrival. For doctor visits, patients may have 1 support person aged 54 or older with them. For treatment visits, patients cannot have anyone with them due to current Covid guidelines and our immunocompromised population.

## 2021-08-10 ENCOUNTER — Inpatient Hospital Stay: Payer: BC Managed Care – PPO

## 2021-08-10 ENCOUNTER — Encounter: Payer: Self-pay | Admitting: Hematology

## 2021-08-10 VITALS — BP 142/65 | HR 81 | Temp 98.3°F | Resp 18

## 2021-08-10 DIAGNOSIS — C8331 Diffuse large B-cell lymphoma, lymph nodes of head, face, and neck: Secondary | ICD-10-CM | POA: Diagnosis not present

## 2021-08-10 DIAGNOSIS — Z5112 Encounter for antineoplastic immunotherapy: Secondary | ICD-10-CM | POA: Diagnosis not present

## 2021-08-10 DIAGNOSIS — Z5111 Encounter for antineoplastic chemotherapy: Secondary | ICD-10-CM | POA: Diagnosis not present

## 2021-08-10 DIAGNOSIS — C8338 Diffuse large B-cell lymphoma, lymph nodes of multiple sites: Secondary | ICD-10-CM

## 2021-08-10 MED ORDER — SODIUM CHLORIDE 0.9 % IV SOLN: Freq: Once | INTRAVENOUS | Status: AC

## 2021-08-10 MED ORDER — HEPARIN SOD (PORK) LOCK FLUSH 100 UNIT/ML IV SOLN
500.0000 [IU] | Freq: Once | INTRAVENOUS | Status: AC | PRN
  Administered 2021-08-10: 500 [IU]

## 2021-08-10 MED ORDER — SODIUM CHLORIDE 0.9 % IV SOLN
50.0000 mg/m2 | Freq: Once | INTRAVENOUS | Status: AC
  Administered 2021-08-10: 100 mg via INTRAVENOUS
  Filled 2021-08-10: qty 5

## 2021-08-10 MED ORDER — PROCHLORPERAZINE MALEATE 10 MG PO TABS
10.0000 mg | ORAL_TABLET | Freq: Once | ORAL | Status: AC
  Administered 2021-08-10: 10 mg via ORAL
  Filled 2021-08-10: qty 1

## 2021-08-10 MED ORDER — SODIUM CHLORIDE 0.9% FLUSH
10.0000 mL | INTRAVENOUS | Status: DC | PRN
  Administered 2021-08-10: 10 mL

## 2021-08-10 MED ORDER — PANTOPRAZOLE SODIUM 40 MG PO TBEC: 40.0000 mg | DELAYED_RELEASE_TABLET | Freq: Every day | ORAL | 3 refills | Status: DC

## 2021-08-10 NOTE — Progress Notes (Signed)
[facility]   HEMATOLOGY/ONCOLOGY CLINIC NOTE  Date of Service: 08/09/2021  Patient Care Team: Rosine Door as PCP - General (Physician Assistant)  CHIEF COMPLAINTS/PURPOSE OF CONSULTATION:  Follow-up for continued evaluation and management of diffuse large B-cell lymphoma  HISTORY OF PRESENTING ILLNESS:  Please see previous note for details on initial presentation  INTERVAL HISTORY:  Barbara Rhodes is a 66 y.o. female here for follow-up prior to R-CEOP chemotherapy. She is unaccompanied for this visit.   Barbara Rhodes reports that she is tolerating chemotherapy very well. Her appetite and energy levels are stable. She is trying to stay active and do water activities/exercises 3 times a week. She adds that the water exercises has improved her hip pain. She denies nausea, vomiting or abdominal pain. Her bowel habits are unchanged without any recurrent episodes of constipation or diarrhea. She denies easy bruising or signs of active bleeding. She has more urinary urgency but denies any foul smelling odor or pain with urination. She has stable neuropathy that was present at baseline and is unchanged with her treatment. She has some difficulty with dexterity and fine motor skills. She denies any interference with her balance.  She denies fevers, chills, night sweats, shortness of breath, chest pain or cough. She has no other complaints.  MEDICAL HISTORY:  Past Medical History:  Diagnosis Date   Arthritis    Breast cancer ([place]) 2000   Stage 2B, ER+/PR-/Her2-   Colon polyps    Family history of adverse reaction to anesthesia 40   Brother passed away at 70 years old from malignant hyperthermia.   Family history of breast cancer    Family history of prostate cancer    Hypertension     SURGICAL HISTORY: Past Surgical History:  Procedure Laterality Date   ABDOMINAL HYSTERECTOMY     BACK SURGERY     BREAST SURGERY     COLONOSCOPY WITH PROPOFOL     pt said propofol  burned her throat when pushed   IR US GUIDE BX ASP/DRAIN  04/03/2021   LYMPH NODE BIOPSY Left 06/02/2021   Procedure: EXCISIONAL CERVICAL LYMPH NODE BIOPSY;  Surgeon: Jovita Kussmaul, MD;  Location: [facility];  Service: General;  Laterality: Left;   MASTECTOMY Right 2000   MASTECTOMY Right 2000   PORTACATH PLACEMENT Right 06/02/2021   Procedure: INSERTION PORT-A-CATH;  Surgeon: Jovita Kussmaul, MD;  Location: [facility];  Service: General;  Laterality: Right;   POSTERIOR CERVICAL LAMINECTOMY Left 01/23/2018   Procedure: LEFT CERVICAL SEVEN- [place], MICRODISCECTOMY;  Surgeon: Jovita Gamma, MD;  Location: [facility];  Service: Neurosurgery;  Laterality: Left;   ROTATOR CUFF REPAIR     TONSILLECTOMY     TRANSFORAMINAL LUMBAR INTERBODY FUSION (TLIF) WITH PEDICLE SCREW FIXATION 2 LEVEL Right 10/30/2019   Procedure: Right Lumbar 3-4 Lumbar 4-5 Transforaminal lumbar interbody fusion;  Surgeon: Erline Levine, MD;  Location: [facility];  Service: Neurosurgery;  Laterality: Right;  3C/RM 21   TUBAL LIGATION     WISDOM TOOTH EXTRACTION      SOCIAL HISTORY: Social History   Socioeconomic History   Marital status: Married    Spouse name: Not on file   Number of children: Not on file   Years of education: Not on file   Highest education level: Not on file  Occupational History   Not on file  Tobacco Use   Smoking status: Never   Smokeless tobacco: Never  Vaping Use   Vaping Use: Never used  Substance  and Sexual Activity   Alcohol use: No   Drug use: No   Sexual activity: Not on file  Other Topics Concern   Not on file  Social History Narrative   Not on file   Social Determinants of Health   Financial Resource Strain: Not on file  Food Insecurity: Not on file  Transportation Needs: Not on file  Physical Activity: Not on file  Stress: Not on file  Social Connections: Not on file  Intimate Partner Violence: Not on file    FAMILY HISTORY: Family History  Problem  Relation Age of Onset   Breast cancer Paternal Grandmother 72   Prostate cancer Father        dx in his 77s   Colon polyps Father 27   Dementia Mother    Hypertension Mother    Dementia Maternal Aunt    Stroke Maternal Grandfather    Rheum arthritis Paternal Grandfather    Breast cancer Other        MGM's sister, post-menopausal breast cancer   Breast cancer Cousin 51       paternal 2nd cousin    ALLERGIES:  is allergic to penicillins and rituximab-pvvr.  MEDICATIONS:  Current Outpatient Medications  Medication Sig Dispense Refill   acetaminophen (TYLENOL) 500 MG tablet Take 1,000 mg by mouth daily as needed for mild pain or moderate pain.     azelastine (OPTIVAR) 0.05 % ophthalmic solution PLEASE SEE ATTACHED FOR DETAILED DIRECTIONS     B Complex-C (B-COMPLEX WITH VITAMIN C) tablet Take 1 tablet by mouth daily.     celecoxib (CELEBREX) 200 MG capsule Restart in 5 days (Patient taking differently: Take 200 mg by mouth daily.) 1 capsule 0   Cholecalciferol (VITAMIN D3) 50 MCG (2000 UT) capsule Take 4,000 Units by mouth daily.     docusate sodium (COLACE) 100 MG capsule Take 100 mg by mouth daily as needed for mild constipation.     doxycycline (VIBRAMYCIN) 100 MG capsule      gabapentin (NEURONTIN) 100 MG capsule TAKE 1 CAPSULE EVERY DAY BY MOUTH AT BEDTIME.     LORazepam (ATIVAN) 0.5 MG tablet Take 1 tablet (0.5 mg total) by mouth every 6 (six) hours as needed (Nausea or vomiting). (Patient not taking: Reported on 06/01/2021) 30 tablet 0   Magnesium 250 MG TABS Take 250 mg by mouth daily.     methocarbamol (ROBAXIN) 500 MG tablet Take 1 tablet by mouth 3 (three) times daily.     Omega-3 Fatty Acids (FISH OIL ULTRA) 1400 MG CAPS Take 1,400 mg by mouth 3 (three) times a week.     ondansetron (ZOFRAN) 8 MG tablet Take 1 tablet (8 mg total) by mouth 2 (two) times daily as needed for refractory nausea / vomiting. Start on day 3 after cyclophosphamide chemotherapy. 30 tablet 1    oxyCODONE-acetaminophen (PERCOCET/ROXICET) 5-325 MG tablet Take 1 tablet by mouth every 4 (four) hours as needed.     pantoprazole (PROTONIX) 40 MG tablet Take 1 tablet (40 mg total) by mouth at bedtime. 30 tablet 3   pegfilgrastim-cbqv (UDENYCA) 6 MG/0.6ML injection 53m Mason x 1 dose -- 24 to 48 hours after completion of each cycle of chemotherapy. 0.6 mL 4   polyethylene glycol (MIRALAX / GLYCOLAX) 17 g packet Take 17 g by mouth daily. 14 each 0   Potassium 99 MG TABS Take 99 mg by mouth daily.     predniSONE (DELTASONE) 20 MG tablet Take 5 tablets (100 mg total) by mouth  daily. Take with food on days 1-5 of chemotherapy. 25 tablet 5   prochlorperazine (COMPAZINE) 10 MG tablet Take 1 tablet (10 mg total) by mouth every 6 (six) hours as needed (Nausea or vomiting). 30 tablet 6   traMADol (ULTRAM) 50 MG tablet Take 1 tablet by mouth every 6 (six) hours as needed.     triamterene-hydrochlorothiazide (MAXZIDE-25) 37.5-25 MG tablet Take 1 tablet by mouth daily.      trimethoprim-polymyxin b (POLYTRIM) ophthalmic solution PLEASE SEE ATTACHED FOR DETAILED DIRECTIONS     vitamin B-12 (CYANOCOBALAMIN) 100 MCG tablet Take 100 mcg by mouth daily. 2 daily     No current facility-administered medications for this visit.    REVIEW OF SYSTEMS:    10 Point review of Systems was done is negative except as noted above.  PHYSICAL EXAMINATION: ECOG PERFORMANCE STATUS: 1 - Symptomatic but completely ambulatory NAD GENERAL:alert, in no acute distress and comfortable SKIN: no acute rashes, no significant lesions EYES: conjunctiva are pink and non-injected, sclera anicteric LUNGS: clear to auscultation b/l with normal respiratory effort HEART: regular rate & rhythm Extremity: no pedal edema PSYCH: alert & oriented x 3 with fluent speech NEURO: no focal motor/sensory deficits  LABORATORY DATA:  I have reviewed the data as listed .    Latest Ref Rng & Units 08/09/2021    7:59 AM 07/19/2021    7:33 AM  07/10/2021   12:50 PM  CBC  WBC 4.0 - 10.5 K/uL 7.0  8.9  7.2   Hemoglobin 12.0 - 15.0 g/dL 11.7  10.8  10.5   Hematocrit 36.0 - 46.0 % 33.6  31.6  31.6   Platelets 150 - 400 K/uL 350  332  165    .    Latest Ref Rng & Units 08/09/2021    7:59 AM 07/19/2021    7:33 AM 07/10/2021   12:50 PM  CMP  Glucose 70 - 99 mg/dL 94  106  162   BUN 8 - 23 mg/dL _0 Creatinine 0.44 - 1.00 mg/dL 0.77  0.79  0.82   Sodium 135 - 145 mmol/L 140  141  140   Potassium 3.5 - 5.1 mmol/L 3.6  3.4  3.3   Chloride 98 - 111 mmol/L 104  107  103   CO2 22 - 32 mmol/L _1 Calcium 8.9 - 10.3 mg/dL 9.9  9.7  9.5   Total Protein 6.5 - 8.1 g/dL 6.6  6.7  6.4   Total Bilirubin 0.3 - 1.2 mg/dL 0.4  0.3  0.3   Alkaline Phos 38 - 126 U/L 80  82  99   AST 15 - 41 U/L _2 ALT 0 - 44 U/L 30  23  32    . Lab Results  Component Value Date   LDH 215 (H) 08/09/2021    SURGICAL PATHOLOGY  CASE: [id number]  PATIENT: Barbara Rhodes  Surgical Pathology Report      Clinical History: remote history of breast cancer, now with  indeterminate left supraclavicular lymphadenopathy (cm)      FINAL MICROSCOPIC DIAGNOSIS:   A. LYMPH NODE, LEFT SUPRACLAVICULAR, NEEDLE CORE BIOPSY:  -Atypical lymphoid proliferation  -See comment   COMMENT:   The sections show several very small needle core biopsy fragments of  apparently lymph nodal tissue displaying an atypical lymphoid  proliferation of primarily large lymphoid appearing cells characterized  by vesicular chromatin and prominent nucleoli  associated with scattered  mitosis.  The appearance is apparently diffuse with lack of atypical  follicles.  Flow cytometric analysis was performed [facility] [id number]) and  shows predominance of T lymphocytes with nonspecific changes.  No  monoclonal B-cell population identified.  A battery of  immunohistochemical stains was performed but the tissue is prominently  exhausted on deeper sectioning.   Scattered residual very minute  fragments show that the lymphoid appearing cells are positive for LCA,  CD20, CD79a, BCL6, MUM1, and Bcl-2.  No significant staining is seen  with CD138, CD10, CD34, cyclin D1, S100, cytokeratin AE1/AE3,  cytokeratin 8/18, or GATA3.  There is an admixed T-cell population to a  lesser extent as seen with CD3 and CD5 and there is no apparent  co-expression of CD5 in B-cell areas.  The following stains were  performed but there is no optimal tissue present on deeper sectioning  for evaluation including CD30, TdT, and in situ hybridization for kappa,  lambda and EBV.  The overall findings are limited but very concerning  for involvement by a B-cell lymphoproliferative process particularly  large cell lymphoma.  Excisional biopsy is recommended to further  evaluate this process.   SURGICAL PATHOLOGY  CASE: [id number]  PATIENT: Barbara Rhodes  Surgical Pathology Report      Clinical History: Cervical lymphocyte; history of Hodgkin's lymphoma  ([facility])    FINAL MICROSCOPIC DIAGNOSIS:   A. LYMPH NODE, LEFT NECK, EXCISION:   -  Diffuse large B-cell lymphoma, activated B-cell subtype  -  See comment   COMMENT:   The excisional biopsy consists of an enlarged lymph node with complete  effacement by a large pleomorphic population of lymphoid cells with  vesicular chromatin and prominent nucleoli.  By immunohistochemistry,  the atypical lymphocytes are positive for CD20, PAX5, Bcl-2, BCL6 and  Mum-1.  The cells are negative for CD5, CD10, CD30 and EBV by in situ  hybridization.  The proliferative rate by Ki-67 is 60-70%.  CD3  highlights background benign-appearing lymphocytes.  Overall, the  findings are consistent with a diffuse large B cell lymphoma, activated  B-cell phenotype.  Preliminary results of this case were discussed with  Dr. Jenell Milliner on Jun 06, 2021.  FISH studies (high-grade/large B-cell  lymphoma) are pending and will be reported in an  addendum.   RADIOGRAPHIC STUDIES: I have personally reviewed the radiological images as listed and agreed with the findings in the report. NM PET Image Restag (PS) Skull Base To Thigh  Result Date: 07/31/2021 CLINICAL DATA:  Subsequent treatment strategy for large B-cell lymphoma status post chemotherapy and immunotherapy. EXAM: NUCLEAR MEDICINE PET SKULL BASE TO THIGH TECHNIQUE: 10.1 mCi F-18 FDG was injected intravenously. Full-ring PET imaging was performed from the skull base to thigh after the radiotracer. CT data was obtained and used for attenuation correction and anatomic localization. Fasting blood glucose: 108 mg/dl COMPARISON:  05/10/2021 PET-CT. FINDINGS: Mediastinal blood pool activity: SUV max 2.7 Liver activity: SUV max 3.9 NECK: Previously visualized bulky hypermetabolic widespread left neck lymphadenopathy is substantially decreased in size and metabolism. Representative 1.2 cm left level 4 neck lymph node with max SUV 2.1 (series 4/image 42), previously 3.0 cm with previous max SUV 23.9. Representative 0.9 cm left level 5 neck lymph node with max SUV 1.8 (series 4/image 34), previously 1.9 cm with previous max SUV 18.6. No newly enlarged or newly hypermetabolic lymph nodes in the neck. Incidental CT findings: Right internal jugular Port-A-Cath terminates at the cavoatrial junction. CHEST: Previously visualized enlarged hypermetabolic  left axillary and left posterior mediastinal lymph nodes have substantially decreased in size and metabolism. Representative 0.7 cm left axillary node with max SUV 1.3 (series 4/image 43), previously 1.7 cm with max SUV 18.2. Representative 1.1 cm left para-aortic mediastinal node with max SUV 2.4 (series 4/image 82), previously 2.2 cm with previous max SUV 23.1. previously visualized hypermetabolic enlarged left prevascular and subcarinal nodes have resolved. No newly enlarged or newly hypermetabolic lymph nodes in the axilla, mediastinum or hilum. No  hypermetabolic pulmonary findings. Incidental CT findings: Surgical clips again noted in the right axilla. Status post right mastectomy. A few scattered small solid bilateral pulmonary nodules measuring up to 0.4 cm in the right middle lobe (series 7/image 35), below PET resolution and unchanged. ABDOMEN/PELVIS: No abnormal hypermetabolic activity within the liver, pancreas, adrenal glands, or spleen. Normal size spleen. Previously visualized widespread bulky hypermetabolic portacaval, aortocaval, left para-aortic and retrocrural lymphadenopathy is substantially decreased in size and metabolism. Representative 0.9 cm porta hepatis node with max SUV 2.1 (series 4/image 99), previously 2.5 cm with max SUV 22.6. Representative 1.0 cm aortocaval node with max SUV 2.7 (series 4/image 131), previously 2.8 cm with max SUV 19.1. No newly enlarged or newly hypermetabolic lymph nodes in the abdomen or pelvis. Incidental CT findings: Cholelithiasis. Mild sigmoid diverticulosis. SKELETON: Diffuse marrow hypermetabolism without discrete osseous lesions on the PET or CT images. Representative max SUV 5.9 in the T11 vertebral body, previous max SUV 6.0, similar. Incidental CT findings: Status post bilateral posterior spinal fusion L3-L5. IMPRESSION: 1. Substantial interval positive response to therapy as detailed. Residual Deauville category 2 uptake within mildly enlarged left lower neck, mediastinal and retroperitoneal lymph nodes. 2. Diffuse marrow hypermetabolism, favor stimulated marrow state. 3. Chronic findings include: Cholelithiasis. Mild sigmoid diverticulosis. Electronically Signed   By: Ilona Sorrel M.D.   On: 07/31/2021 18:15    ASSESSMENT & PLAN:  Tashawn Greff is a 66 y.o. female with previous history of right-sided breast cancer status post right mastectomy and adjuvant chemotherapy including doxorubicin now with diffuse large B-cell lymphoma.   1) Stage III/IV diffuse large B-cell  lymphoma.-Activated B-cell subtype --High risk lymphoma FISH panel no evidence of double or triple hit lymphoma --Patient received cycle 1 of cytoxan, vincristine, rituxan on 06/03/2021 inpatient. She resume treatment in the outpatient infusion center on 06/28/2021 with cytoxan, vincristine, etoposide and rituxan.  --Mid treatment PET scan on 07/31/2021 showed substantial interval treatment response with residual deauville category 2 uptake within mildly enlarged left lower neck, mediastinal and retroperitoneal lymph nodes.   #Anemia: --Secondary to lymphoma involvement of bone marrow  # Remote history of right breast cancer: --status post mastectomy and adjuvant chemotherapy  # Rigors with Rituxan dose being At 200 mg/h  #Neuropathy: --Present at baseline. Tried gabapentin without improvement of symptoms.  --monitor for now.   PLAN -Labs from today were reviewed and adequate for treatment today -Patient is tolerating treatment well proceed with R-CEOP without any modifications.  -Continue with Udenyca growth factor support at home on day 5 of each cycle of treatment. -Continue to premedicate with her prednisone for 2 days prior to her treatment and take the remaining prednisone for 3 days after her treatment with additional famotidine and an antihistamine to reduce risk of Rituxan related reactions.  FOLLOW UP: RTC on 09/04/2021 with port labs, follow up visit with Dr. Jasiel Belisle Limbo before next cycle of R-CEOP.   All of the patient's questions were answered with apparent satisfaction. The patient knows to call the clinic  with any problems, questions or concerns.  I have spent a total of 30 minutes minutes of face-to-face and non-face-to-face time, preparing to see the patient, performing a medically appropriate examination, counseling and educating the patient, ordering medications/tests, documenting clinical information in the electronic health record,  and care coordination.   Dede Query  PA-C Dept of Hematology and [place] at [facility] Phone: [phone]

## 2021-08-10 NOTE — Patient Instructions (Signed)
[facility]  Discharge Instructions: Thank you for choosing [facility] to provide your oncology and hematology care.   If you have a lab appointment with the Cancer Center, please go directly to the Cancer Center and check in at the registration area.   Wear comfortable clothing and clothing appropriate for easy access to any Portacath or PICC line.   We strive to give you quality time with your provider. You may need to reschedule your appointment if you arrive late (15 or more minutes).  Arriving late affects you and other patients whose appointments are after yours.  Also, if you miss three or more appointments without notifying the office, you may be dismissed from the clinic at the provider's discretion.      For prescription refill requests, have your pharmacy contact our office and allow 72 hours for refills to be completed.    Today you received the following chemotherapy and/or immunotherapy agents etoposide      To help prevent nausea and vomiting after your treatment, we encourage you to take your nausea medication as directed.  BELOW ARE SYMPTOMS THAT SHOULD BE REPORTED IMMEDIATELY: *FEVER GREATER THAN 100.4 F (38 C) OR HIGHER *CHILLS OR SWEATING *NAUSEA AND VOMITING THAT IS NOT CONTROLLED WITH YOUR NAUSEA MEDICATION *UNUSUAL SHORTNESS OF BREATH *UNUSUAL BRUISING OR BLEEDING *URINARY PROBLEMS (pain or burning when urinating, or frequent urination) *BOWEL PROBLEMS (unusual diarrhea, constipation, pain near the anus) TENDERNESS IN MOUTH AND THROAT WITH OR WITHOUT PRESENCE OF ULCERS (sore throat, sores in mouth, or a toothache) UNUSUAL RASH, SWELLING OR PAIN  UNUSUAL VAGINAL DISCHARGE OR ITCHING   Items with * indicate a potential emergency and should be followed up as soon as possible or go to the Emergency Department if any problems should occur.  Please show the CHEMOTHERAPY ALERT CARD or IMMUNOTHERAPY ALERT CARD at check-in to  the Emergency Department and triage nurse.  Should you have questions after your visit or need to cancel or reschedule your appointment, please contact [facility]COL[facility]  Dept: [phone]  and follow the prompts.  Office hours are 8:00 a.m. to 4:30 p.m. Monday - Friday. Please note that voicemails left after 4:00 p.m. may not be returned until the following business day.  We are closed weekends and major holidays. You have access to a nurse at all times for urgent questions. Please call the main number to the clinic Dept: [phone] and follow the prompts.   For any non-urgent questions, you may also contact your provider using MyChart. We now offer e-Visits for anyone 18 and older to request care online for non-urgent symptoms. For details visit [email or web address].   Also download the MyChart app! Go to the app store, search "MyChart", open the app, select [facility], and log in with your MyChart username and password.  Masks are optional in the cancer centers. If you would like for your care team to wear a mask while they are taking care of you, please let them know. For doctor visits, patients may have with them one support person who is at least 68 years old. At this time, visitors are not allowed in the infusion area. 

## 2021-08-11 ENCOUNTER — Other Ambulatory Visit: Payer: Self-pay

## 2021-08-11 ENCOUNTER — Inpatient Hospital Stay: Payer: BC Managed Care – PPO

## 2021-08-11 VITALS — BP 144/81 | HR 98 | Temp 98.1°F | Resp 18

## 2021-08-11 DIAGNOSIS — Z5112 Encounter for antineoplastic immunotherapy: Secondary | ICD-10-CM | POA: Diagnosis not present

## 2021-08-11 DIAGNOSIS — C8338 Diffuse large B-cell lymphoma, lymph nodes of multiple sites: Secondary | ICD-10-CM

## 2021-08-11 DIAGNOSIS — C8331 Diffuse large B-cell lymphoma, lymph nodes of head, face, and neck: Secondary | ICD-10-CM | POA: Diagnosis not present

## 2021-08-11 DIAGNOSIS — Z5111 Encounter for antineoplastic chemotherapy: Secondary | ICD-10-CM | POA: Diagnosis not present

## 2021-08-11 MED ORDER — HEPARIN SOD (PORK) LOCK FLUSH 100 UNIT/ML IV SOLN
500.0000 [IU] | Freq: Once | INTRAVENOUS | Status: AC | PRN
  Administered 2021-08-11: 500 [IU]

## 2021-08-11 MED ORDER — SODIUM CHLORIDE 0.9 % IV SOLN
50.0000 mg/m2 | Freq: Once | INTRAVENOUS | Status: AC
  Administered 2021-08-11: 100 mg via INTRAVENOUS
  Filled 2021-08-11: qty 5

## 2021-08-11 MED ORDER — SODIUM CHLORIDE 0.9% FLUSH
10.0000 mL | INTRAVENOUS | Status: DC | PRN
  Administered 2021-08-11: 10 mL

## 2021-08-11 MED ORDER — SODIUM CHLORIDE 0.9 % IV SOLN: Freq: Once | INTRAVENOUS | Status: AC

## 2021-08-11 MED ORDER — PROCHLORPERAZINE MALEATE 10 MG PO TABS
10.0000 mg | ORAL_TABLET | Freq: Once | ORAL | Status: AC
  Administered 2021-08-11: 10 mg via ORAL
  Filled 2021-08-11: qty 1

## 2021-08-14 ENCOUNTER — Other Ambulatory Visit: Payer: Self-pay

## 2021-08-22 ENCOUNTER — Other Ambulatory Visit: Payer: Self-pay

## 2021-09-01 ENCOUNTER — Other Ambulatory Visit: Payer: Self-pay | Admitting: *Deleted

## 2021-09-01 DIAGNOSIS — C8338 Diffuse large B-cell lymphoma, lymph nodes of multiple sites: Secondary | ICD-10-CM

## 2021-09-04 ENCOUNTER — Inpatient Hospital Stay: Payer: BC Managed Care – PPO | Admitting: Hematology

## 2021-09-04 ENCOUNTER — Inpatient Hospital Stay: Payer: BC Managed Care – PPO | Attending: Hematology and Oncology

## 2021-09-04 ENCOUNTER — Other Ambulatory Visit: Payer: Self-pay

## 2021-09-04 ENCOUNTER — Inpatient Hospital Stay: Payer: BC Managed Care – PPO

## 2021-09-04 ENCOUNTER — Other Ambulatory Visit: Payer: Self-pay | Admitting: Hematology

## 2021-09-04 VITALS — BP 112/67 | HR 66 | Temp 97.8°F | Resp 18 | Wt 211.1 lb

## 2021-09-04 DIAGNOSIS — Z5111 Encounter for antineoplastic chemotherapy: Secondary | ICD-10-CM | POA: Diagnosis not present

## 2021-09-04 DIAGNOSIS — C8338 Diffuse large B-cell lymphoma, lymph nodes of multiple sites: Secondary | ICD-10-CM

## 2021-09-04 DIAGNOSIS — Z5112 Encounter for antineoplastic immunotherapy: Secondary | ICD-10-CM | POA: Diagnosis not present

## 2021-09-04 DIAGNOSIS — C8331 Diffuse large B-cell lymphoma, lymph nodes of head, face, and neck: Secondary | ICD-10-CM | POA: Insufficient documentation

## 2021-09-04 DIAGNOSIS — Z95828 Presence of other vascular implants and grafts: Secondary | ICD-10-CM

## 2021-09-04 LAB — CMP (CANCER CENTER ONLY)
ALT: 21 U/L (ref 0–44)
AST: 17 U/L (ref 15–41)
Albumin: 4.2 g/dL (ref 3.5–5.0)
Alkaline Phosphatase: 73 U/L (ref 38–126)
Anion gap: 6 (ref 5–15)
BUN: 21 mg/dL (ref 8–23)
CO2: 31 mmol/L (ref 22–32)
Calcium: 9.5 mg/dL (ref 8.9–10.3)
Chloride: 105 mmol/L (ref 98–111)
Creatinine: 0.8 mg/dL (ref 0.44–1.00)
GFR, Estimated: >60 mL/min (ref >60)
Glucose, Bld: 93 mg/dL (ref 70–99)
Potassium: 3.4 mmol/L — ABNORMAL LOW (ref 3.5–5.1)
Sodium: 142 mmol/L (ref 135–145)
Total Bilirubin: 0.4 mg/dL (ref 0.3–1.2)
Total Protein: 6.3 g/dL — ABNORMAL LOW (ref 6.5–8.1)

## 2021-09-04 LAB — CBC WITH DIFFERENTIAL (CANCER CENTER ONLY)
Abs Immature Granulocytes: 0.04 10*3/uL (ref 0.00–0.07)
Basophils Absolute: 0 10*3/uL (ref 0.0–0.1)
Basophils Relative: 0 %
Eosinophils Absolute: 0 10*3/uL (ref 0.0–0.5)
Eosinophils Relative: 1 %
HCT: 32.4 % — ABNORMAL LOW (ref 36.0–46.0)
Hemoglobin: 11.4 g/dL — ABNORMAL LOW (ref 12.0–15.0)
Immature Granulocytes: 1 %
Lymphocytes Relative: 32 %
Lymphs Abs: 2.3 10*3/uL (ref 0.7–4.0)
MCH: 32.9 pg (ref 26.0–34.0)
MCHC: 35.2 g/dL (ref 30.0–36.0)
MCV: 93.6 fL (ref 80.0–100.0)
Monocytes Absolute: 1 10*3/uL (ref 0.1–1.0)
Monocytes Relative: 14 %
Neutro Abs: 3.9 10*3/uL (ref 1.7–7.7)
Neutrophils Relative %: 52 %
Platelet Count: 340 10*3/uL (ref 150–400)
RBC: 3.46 MIL/uL — ABNORMAL LOW (ref 3.87–5.11)
RDW: 15 % (ref 11.5–15.5)
WBC Count: 7.4 10*3/uL (ref 4.0–10.5)
nRBC: 0 % (ref 0.0–0.2)

## 2021-09-04 LAB — LACTATE DEHYDROGENASE: LDH: 240 U/L — ABNORMAL HIGH (ref 98–192)

## 2021-09-04 MED ORDER — VINCRISTINE SULFATE CHEMO INJECTION 1 MG/ML
1.0000 mg | Freq: Once | INTRAVENOUS | Status: AC
  Administered 2021-09-04: 1 mg via INTRAVENOUS
  Filled 2021-09-04: qty 1

## 2021-09-04 MED ORDER — DIPHENHYDRAMINE HCL 25 MG PO CAPS
50.0000 mg | ORAL_CAPSULE | Freq: Once | ORAL | Status: AC
  Administered 2021-09-04: 50 mg via ORAL

## 2021-09-04 MED ORDER — POTASSIUM CHLORIDE CRYS ER 20 MEQ PO TBCR
20.0000 meq | EXTENDED_RELEASE_TABLET | Freq: Once | ORAL | Status: AC
  Administered 2021-09-04: 20 meq via ORAL

## 2021-09-04 MED ORDER — PALONOSETRON HCL INJECTION 0.25 MG/5ML
0.2500 mg | Freq: Once | INTRAVENOUS | Status: AC
  Administered 2021-09-04: 0.25 mg via INTRAVENOUS

## 2021-09-04 MED ORDER — MEPERIDINE HCL 25 MG/ML IJ SOLN
25.0000 mg | Freq: Once | INTRAMUSCULAR | Status: AC
  Administered 2021-09-04: 25 mg via INTRAVENOUS

## 2021-09-04 MED ORDER — POTASSIUM CHLORIDE CRYS ER 20 MEQ PO TBCR
EXTENDED_RELEASE_TABLET | ORAL | Status: AC
  Filled 2021-09-04: qty 1

## 2021-09-04 MED ORDER — HEPARIN SOD (PORK) LOCK FLUSH 100 UNIT/ML IV SOLN
500.0000 [IU] | Freq: Once | INTRAVENOUS | Status: AC | PRN
  Administered 2021-09-04: 500 [IU]

## 2021-09-04 MED ORDER — FAMOTIDINE IN NACL 20-0.9 MG/50ML-% IV SOLN
20.0000 mg | Freq: Once | INTRAVENOUS | Status: AC
  Administered 2021-09-04: 20 mg via INTRAVENOUS

## 2021-09-04 MED ORDER — MONTELUKAST SODIUM 10 MG PO TABS
10.0000 mg | ORAL_TABLET | Freq: Once | ORAL | Status: AC
  Administered 2021-09-04: 10 mg via ORAL

## 2021-09-04 MED ORDER — METHYLPREDNISOLONE SODIUM SUCC 125 MG IJ SOLR
125.0000 mg | Freq: Once | INTRAMUSCULAR | Status: AC
  Administered 2021-09-04: 125 mg via INTRAVENOUS

## 2021-09-04 MED ORDER — SODIUM CHLORIDE 0.9 % IV SOLN: Freq: Once | INTRAVENOUS | Status: AC

## 2021-09-04 MED ORDER — ACETAMINOPHEN 325 MG PO TABS
650.0000 mg | ORAL_TABLET | Freq: Once | ORAL | Status: AC
  Administered 2021-09-04: 650 mg via ORAL

## 2021-09-04 MED ORDER — MEPERIDINE HCL 25 MG/ML IJ SOLN
INTRAMUSCULAR | Status: AC
  Filled 2021-09-04: qty 1

## 2021-09-04 MED ORDER — SODIUM CHLORIDE 0.9% FLUSH
10.0000 mL | INTRAVENOUS | Status: DC | PRN
  Administered 2021-09-04: 10 mL

## 2021-09-04 MED ORDER — SODIUM CHLORIDE 0.9 % IV SOLN
750.0000 mg/m2 | Freq: Once | INTRAVENOUS | Status: AC
  Administered 2021-09-04: 1560 mg via INTRAVENOUS
  Filled 2021-09-04: qty 78

## 2021-09-04 MED ORDER — SODIUM CHLORIDE 0.9 % IV SOLN
375.0000 mg/m2 | Freq: Once | INTRAVENOUS | Status: AC
  Administered 2021-09-04: 800 mg via INTRAVENOUS
  Filled 2021-09-04: qty 50

## 2021-09-04 MED ORDER — SODIUM CHLORIDE 0.9% FLUSH
10.0000 mL | Freq: Once | INTRAVENOUS | Status: AC
  Administered 2021-09-04: 10 mL

## 2021-09-04 MED ORDER — SODIUM CHLORIDE 0.9 % IV SOLN
50.0000 mg/m2 | Freq: Once | INTRAVENOUS | Status: AC
  Administered 2021-09-04: 100 mg via INTRAVENOUS
  Filled 2021-09-04: qty 5

## 2021-09-04 NOTE — Progress Notes (Signed)
Barbara Rhodes   HEMATOLOGY/ONCOLOGY CLINIC NOTE  Date of Service: 09/04/2021    Patient Care Team: Rosine Door as PCP - General (Physician Assistant)  CHIEF COMPLAINTS/PURPOSE OF CONSULTATION:  Follow-up for continued evaluation and management of diffuse large B-cell lymphoma  HISTORY OF PRESENTING ILLNESS:  Please see previous note for details on initial presentation  INTERVAL HISTORY:  Barbara Rhodes is a 66 y.o. female here for follow-up prior to cycle 4-day 1 of  R CEOP chemotherapy (has previously received 1 cycle of R-CVP). She reports She is doing well with no new symptoms or concerns.  She notes post treatment minor urinary incontinence with no discomfort.  She notes pain in her right leg closer to the hip onset by her pool exercise when having not exercised in a week. She takes Gabapentin and Oxycodone for pain management for her chronic back pain issues and radiculopathy related to this. No changes in pain from previous cycle.  We discussed getting repeat PET CT scan.   No uncontrolled nausea or vomiting. No change in neuropathic symptoms. No new fatigue. No fevers or chills. No abnormal bleeding or bruising. No new lumps or bumps. No other new or acute focal symptoms.  Labs done today were discussed in detail with the patient.  MEDICAL HISTORY:  Past Medical History:  Diagnosis Date   Arthritis    Breast cancer ([place]) 2000   Stage 2B, ER+/PR-/Her2-   Colon polyps    Family history of adverse reaction to anesthesia 35   Brother passed away at 74 years old from malignant hyperthermia.   Family history of breast cancer    Family history of prostate cancer    Hypertension     SURGICAL HISTORY: Past Surgical History:  Procedure Laterality Date   ABDOMINAL HYSTERECTOMY     BACK SURGERY     BREAST SURGERY     COLONOSCOPY WITH PROPOFOL     pt said propofol burned her throat when pushed   IR US GUIDE BX ASP/DRAIN  04/03/2021   LYMPH NODE  BIOPSY Left 06/02/2021   Procedure: EXCISIONAL CERVICAL LYMPH NODE BIOPSY;  Surgeon: Jovita Kussmaul, MD;  Location: [facility];  Service: General;  Laterality: Left;   MASTECTOMY Right 2000   MASTECTOMY Right 2000   PORTACATH PLACEMENT Right 06/02/2021   Procedure: INSERTION PORT-A-CATH;  Surgeon: Jovita Kussmaul, MD;  Location: [facility];  Service: General;  Laterality: Right;   POSTERIOR CERVICAL LAMINECTOMY Left 01/23/2018   Procedure: LEFT CERVICAL SEVEN- [place], MICRODISCECTOMY;  Surgeon: Jovita Gamma, MD;  Location: [facility];  Service: Neurosurgery;  Laterality: Left;   ROTATOR CUFF REPAIR     TONSILLECTOMY     TRANSFORAMINAL LUMBAR INTERBODY FUSION (TLIF) WITH PEDICLE SCREW FIXATION 2 LEVEL Right 10/30/2019   Procedure: Right Lumbar 3-4 Lumbar 4-5 Transforaminal lumbar interbody fusion;  Surgeon: Erline Levine, MD;  Location: [facility];  Service: Neurosurgery;  Laterality: Right;  3C/RM 21   TUBAL LIGATION     WISDOM TOOTH EXTRACTION      SOCIAL HISTORY: Social History   Socioeconomic History   Marital status: Married    Spouse name: Not on file   Number of children: Not on file   Years of education: Not on file   Highest education level: Not on file  Occupational History   Not on file  Tobacco Use   Smoking status: Never   Smokeless tobacco: Never  Vaping Use   Vaping Use: Never used  Substance and Sexual  Activity   Alcohol use: No   Drug use: No   Sexual activity: Not on file  Other Topics Concern   Not on file  Social History Narrative   Not on file   Social Determinants of Health   Financial Resource Strain: Not on file  Food Insecurity: Not on file  Transportation Needs: Not on file  Physical Activity: Not on file  Stress: Not on file  Social Connections: Not on file  Intimate Partner Violence: Not on file    FAMILY HISTORY: Family History  Problem Relation Age of Onset   Breast cancer Paternal Grandmother 57   Prostate cancer Father         dx in his 59s   Colon polyps Father 20   Dementia Mother    Hypertension Mother    Dementia Maternal Aunt    Stroke Maternal Grandfather    Rheum arthritis Paternal Grandfather    Breast cancer Other        MGM's sister, post-menopausal breast cancer   Breast cancer Cousin 80       paternal 2nd cousin    ALLERGIES:  is allergic to penicillins and rituximab-pvvr.  MEDICATIONS:  Current Outpatient Medications  Medication Sig Dispense Refill   acetaminophen (TYLENOL) 500 MG tablet Take 1,000 mg by mouth daily as needed for mild pain or moderate pain.     B Complex-C (B-COMPLEX WITH VITAMIN C) tablet Take 1 tablet by mouth daily.     celecoxib (CELEBREX) 200 MG capsule Restart in 5 days (Patient taking differently: Take 200 mg by mouth daily.) 1 capsule 0   Cholecalciferol (VITAMIN D3) 50 MCG (2000 UT) capsule Take 4,000 Units by mouth daily.     docusate sodium (COLACE) 100 MG capsule Take 100 mg by mouth daily as needed for mild constipation.     LORazepam (ATIVAN) 0.5 MG tablet Take 1 tablet (0.5 mg total) by mouth every 6 (six) hours as needed (Nausea or vomiting). (Patient not taking: Reported on 06/01/2021) 30 tablet 0   Magnesium 250 MG TABS Take 250 mg by mouth daily.     Omega-3 Fatty Acids (FISH OIL ULTRA) 1400 MG CAPS Take 1,400 mg by mouth 3 (three) times a week.     ondansetron (ZOFRAN) 8 MG tablet Take 1 tablet (8 mg total) by mouth 2 (two) times daily as needed for refractory nausea / vomiting. Start on day 3 after cyclophosphamide chemotherapy. 30 tablet 1   oxyCODONE-acetaminophen (PERCOCET/ROXICET) 5-325 MG tablet Take 1 tablet by mouth every 4 (four) hours as needed.     pantoprazole (PROTONIX) 40 MG tablet Take 1 tablet (40 mg total) by mouth at bedtime. 30 tablet 3   pegfilgrastim-cbqv (UDENYCA) 6 MG/0.6ML injection 8m [place] x 1 dose -- 24 to 48 hours after completion of each cycle of chemotherapy. 0.6 mL 4   polyethylene glycol (MIRALAX / GLYCOLAX) 17 g packet Take  17 g by mouth daily. 14 each 0   Potassium 99 MG TABS Take 99 mg by mouth daily.     predniSONE (DELTASONE) 20 MG tablet Take 5 tablets (100 mg total) by mouth daily. Take with food on days 1-5 of chemotherapy. 25 tablet 5   prochlorperazine (COMPAZINE) 10 MG tablet Take 1 tablet (10 mg total) by mouth every 6 (six) hours as needed (Nausea or vomiting). 30 tablet 6   traMADol (ULTRAM) 50 MG tablet Take 1 tablet by mouth every 6 (six) hours as needed.     triamterene-hydrochlorothiazide (MAXZIDE-25) 37.5-25  MG tablet Take 1 tablet by mouth daily.      vitamin B-12 (CYANOCOBALAMIN) 100 MCG tablet Take 100 mcg by mouth daily. 2 daily     No current facility-administered medications for this visit.   Facility-Administered Medications Ordered in Other Visits  Medication Dose Route Frequency Provider Last Rate Last Admin   0.9 %  sodium chloride infusion   Intravenous Once Brunetta Genera, MD       acetaminophen (TYLENOL) tablet 650 mg  650 mg Oral Once Brunetta Genera, MD       cyclophosphamide (CYTOXAN) 1,560 mg in sodium chloride 0.9 % 250 mL chemo infusion  750 mg/m2 (Order-Specific) Intravenous Once Brunetta Genera, MD       diphenhydrAMINE (BENADRYL) capsule 50 mg  50 mg Oral Once Brunetta Genera, MD       etoposide (VEPESID) 100 mg in sodium chloride 0.9 % 500 mL chemo infusion  50 mg/m2 (Order-Specific) Intravenous Once Brunetta Genera, MD       famotidine (PEPCID) IVPB 20 mg premix  20 mg Intravenous Once Brunetta Genera, MD       heparin lock flush 100 unit/mL  500 Units Intracatheter Once PRN Brunetta Genera, MD       meperidine (DEMEROL) injection 25 mg  25 mg Intravenous Once Brunetta Genera, MD       methylPREDNISolone sodium succinate (SOLU-MEDROL) 125 mg/2 mL injection 125 mg  125 mg Intravenous Once Brunetta Genera, MD       montelukast (SINGULAIR) tablet 10 mg  10 mg Oral Once Brunetta Genera, MD       palonosetron (ALOXI) injection  0.25 mg  0.25 mg Intravenous Once Brunetta Genera, MD       riTUXimab-pvvr (RUXIENCE) 800 mg in sodium chloride 0.9 % 250 mL (2.4242 mg/mL) infusion  375 mg/m2 (Order-Specific) Intravenous Once Brunetta Genera, MD       sodium chloride flush (NS) 0.9 % injection 10 mL  10 mL Intracatheter PRN Brunetta Genera, MD       vinCRIStine (ONCOVIN) 1 mg in sodium chloride 0.9 % 50 mL chemo infusion  1 mg Intravenous Once Brunetta Genera, MD        REVIEW OF SYSTEMS:   10 Point review of Systems was done is negative except as noted above.  PHYSICAL EXAMINATION: ECOG PERFORMANCE STATUS: 1 - Symptomatic but completely ambulatory NAD GENERAL:alert, in no acute distress and comfortable SKIN: no acute rashes, no significant lesions EYES: conjunctiva are pink and non-injected, sclera anicteric OROPHARYNX: MMM, no exudates, no oropharyngeal erythema or ulceration NECK: supple, no JVD LYMPH:  no palpable lymphadenopathy in the cervical, axillary or inguinal regions LUNGS: clear to auscultation b/l with normal respiratory effort HEART: regular rate & rhythm ABDOMEN:  normoactive bowel sounds , non tender, not distended. Extremity: no pedal edema PSYCH: alert & oriented x 3 with fluent speech NEURO: no focal motor/sensory deficits  Exam performed in chair.  LABORATORY DATA:  I have reviewed the data as listed .    Latest Ref Rng & Units 09/04/2021    8:26 AM 08/09/2021    7:59 AM 07/19/2021    7:33 AM  CBC  WBC 4.0 - 10.5 K/uL 7.4  7.0  8.9   Hemoglobin 12.0 - 15.0 g/dL 11.4  11.7  10.8   Hematocrit 36.0 - 46.0 % 32.4  33.6  31.6   Platelets 150 - 400 K/uL 340  350  332    .  Latest Ref Rng & Units 09/04/2021    8:26 AM 08/09/2021    7:59 AM 07/19/2021    7:33 AM  CMP  Glucose 70 - 99 mg/dL 93  94  106   BUN 8 - 23 mg/dL _0 Creatinine 0.44 - 1.00 mg/dL 0.80  0.77  0.79   Sodium 135 - 145 mmol/L 142  140  141   Potassium 3.5 - 5.1 mmol/L 3.4  3.6  3.4    Chloride 98 - 111 mmol/L 105  104  107   CO2 22 - 32 mmol/L _1 Calcium 8.9 - 10.3 mg/dL 9.5  9.9  9.7   Total Protein 6.5 - 8.1 g/dL 6.3  6.6  6.7   Total Bilirubin 0.3 - 1.2 mg/dL 0.4  0.4  0.3   Alkaline Phos 38 - 126 U/L 73  80  82   AST 15 - 41 U/L _2 ALT 0 - 44 U/L _3 . Lab Results  Component Value Date   LDH 240 (H) 09/04/2021    SURGICAL PATHOLOGY  CASE: [id number]  PATIENT: Xiana Bixler  Surgical Pathology Report   Clinical History: remote history of breast cancer, now with  indeterminate left supraclavicular lymphadenopathy (cm)   FINAL MICROSCOPIC DIAGNOSIS:   A. LYMPH NODE, LEFT SUPRACLAVICULAR, NEEDLE CORE BIOPSY:  -Atypical lymphoid proliferation  -See comment   COMMENT:   The sections show several very small needle core biopsy fragments of  apparently lymph nodal tissue displaying an atypical lymphoid  proliferation of primarily large lymphoid appearing cells characterized  by vesicular chromatin and prominent nucleoli associated with scattered  mitosis.  The appearance is apparently diffuse with lack of atypical  follicles.  Flow cytometric analysis was performed [facility] [id number]) and  shows predominance of T lymphocytes with nonspecific changes.  No  monoclonal B-cell population identified.  A battery of  immunohistochemical stains was performed but the tissue is prominently  exhausted on deeper sectioning.  Scattered residual very minute  fragments show that the lymphoid appearing cells are positive for LCA,  CD20, CD79a, BCL6, MUM1, and Bcl-2.  No significant staining is seen  with CD138, CD10, CD34, cyclin D1, S100, cytokeratin AE1/AE3,  cytokeratin 8/18, or GATA3.  There is an admixed T-cell population to a  lesser extent as seen with CD3 and CD5 and there is no apparent  co-expression of CD5 in B-cell areas.  The following stains were  performed but there is no optimal tissue present on deeper sectioning  for  evaluation including CD30, TdT, and in situ hybridization for kappa,  lambda and EBV.  The overall findings are limited but very concerning  for involvement by a B-cell lymphoproliferative process particularly  large cell lymphoma.  Excisional biopsy is recommended to further  evaluate this process.   SURGICAL PATHOLOGY  CASE: [id number]  PATIENT: Berkley Harvey  Surgical Pathology Report      Clinical History: Cervical lymphocyte; history of Hodgkin's lymphoma  ([facility])    FINAL MICROSCOPIC DIAGNOSIS:   A. LYMPH NODE, LEFT NECK, EXCISION:   -  Diffuse large B-cell lymphoma, activated B-cell subtype  -  See comment   COMMENT:   The excisional biopsy consists of an enlarged lymph node with complete  effacement by a large pleomorphic population of lymphoid cells with  vesicular chromatin and prominent nucleoli.  By immunohistochemistry,  the atypical lymphocytes are  positive for CD20, PAX5, Bcl-2, BCL6 and  Mum-1.  The cells are negative for CD5, CD10, CD30 and EBV by in situ  hybridization.  The proliferative rate by Ki-67 is 60-70%.  CD3  highlights background benign-appearing lymphocytes.  Overall, the  findings are consistent with a diffuse large B cell lymphoma, activated  B-cell phenotype.  Preliminary results of this case were discussed with  Dr. Jenell Milliner on Jun 06, 2021.  FISH studies (high-grade/large B-cell  lymphoma) are pending and will be reported in an addendum.   RADIOGRAPHIC STUDIES: I have personally reviewed the radiological images as listed and agreed with the findings in the report. No results found.  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, sports with previous history of right-sided breast cancer status post right mastectomy and adjuvant chemotherapy including doxorubicin now with  #1 stage III/IV diffuse large B-cell lymphoma.-Activated B-cell subtype High risk lymphoma FISH panel no evidence of double or triple hit lymphoma Patient received cycle 1  of R-CVP Has completed 1 cycle of R-CVP as inpatient She is currently on cycle 4 of R-CEOP   #2 anemia due to lymphoma involvement of bone marrow #3 status post Port-A-Cath placement #4 thrombocytopenia secondary to #1 #5 high risk of tumor lysis syndrome #6 anxiety #7 remote history of right breast cancer status postmastectomy and adjuvant chemotherapy #8 rigors with Rituxan dose being At 200 mg/h  Plan -Labs done today were discussed in detail with the patient. CBC and CMP stable LDH slightly elevated likely from G-CSF use No new toxicities from her current chemoimmunotherapy with R-CEOP. We will continue the same regimen with same supportive medications at this time.   Continue Rituxan infusion with peak rates of 300 mg/h with same premedications. Patient will continue Udenyca growth factor support at home on day 5 of each cycle of treatment. -Continue to premedicate with her prednisone for 2 days prior to her treatment and take the remaining prednisone for 3 days after her treatment with additional famotidine and an antihistamine to reduce risk of Rituxan related reactions. -Continue reduced dosage vincristine 1 mg cap. -PET CT scan done 07/31/2021 revealed positive response to therapy with Residual Deauville category 2 uptake within somewhat enlarged left lower neck, mediastinal and retroperitoneal lymph nodes. Diffuse marrow hypermetabolism which suggests stimulated marrow state. Also show Cholelithiasis and mild sigmoid diverticulosis. -We will get repeat PET CT scan after her fifth cycle of R CEOP i.e. 6 cycles of chemotherapy.   Follow-up Please follow-up as per scheduled appointments for fifth cycle of R-CEOP in 3 weeks with port flush labs and MD visit PET CT scan in 8 weeks Return to clinic with Dr. Irene Limbo with port flush and labs in 9 weeks    The total time spent in the appointment was 32 minutes*.  All of the patient's questions were answered with apparent  satisfaction. The patient knows to call the clinic with any problems, questions or concerns.   Sullivan Lone MD MS AAHIVMS [facility] Hematology/Oncology Physician [facility]  .*Total Encounter Time as defined by the Centers for Medicare and Medicaid Services includes, in addition to the face-to-face time of a patient visit (documented in the note above) non-face-to-face time: obtaining and reviewing outside history, ordering and reviewing medications, tests or procedures, care coordination (communications with other health care professionals or caregivers) and documentation in the medical record.  I, Melene Muller, am acting as scribe for Dr. Sullivan Lone, MD.  .I have reviewed the above documentation for accuracy and completeness,  and I agree with the above. Brunetta Genera MD

## 2021-09-05 ENCOUNTER — Inpatient Hospital Stay: Payer: BC Managed Care – PPO

## 2021-09-05 ENCOUNTER — Telehealth: Payer: Self-pay | Admitting: Hematology

## 2021-09-05 VITALS — BP 141/76 | HR 65 | Temp 98.1°F | Resp 18

## 2021-09-05 DIAGNOSIS — C8338 Diffuse large B-cell lymphoma, lymph nodes of multiple sites: Secondary | ICD-10-CM

## 2021-09-05 DIAGNOSIS — Z5112 Encounter for antineoplastic immunotherapy: Secondary | ICD-10-CM | POA: Diagnosis not present

## 2021-09-05 DIAGNOSIS — C8331 Diffuse large B-cell lymphoma, lymph nodes of head, face, and neck: Secondary | ICD-10-CM | POA: Diagnosis not present

## 2021-09-05 DIAGNOSIS — Z5111 Encounter for antineoplastic chemotherapy: Secondary | ICD-10-CM | POA: Diagnosis not present

## 2021-09-05 MED ORDER — PROCHLORPERAZINE MALEATE 10 MG PO TABS
10.0000 mg | ORAL_TABLET | Freq: Once | ORAL | Status: AC
  Administered 2021-09-05: 10 mg via ORAL

## 2021-09-05 MED ORDER — SODIUM CHLORIDE 0.9 % IV SOLN
50.0000 mg/m2 | Freq: Once | INTRAVENOUS | Status: AC
  Administered 2021-09-05: 100 mg via INTRAVENOUS
  Filled 2021-09-05: qty 5

## 2021-09-05 MED ORDER — HEPARIN SOD (PORK) LOCK FLUSH 100 UNIT/ML IV SOLN
500.0000 [IU] | Freq: Once | INTRAVENOUS | Status: AC | PRN
  Administered 2021-09-05: 500 [IU]

## 2021-09-05 MED ORDER — SODIUM CHLORIDE 0.9% FLUSH
10.0000 mL | INTRAVENOUS | Status: DC | PRN
  Administered 2021-09-05: 10 mL

## 2021-09-05 MED ORDER — SODIUM CHLORIDE 0.9 % IV SOLN: Freq: Once | INTRAVENOUS | Status: AC

## 2021-09-05 NOTE — Telephone Encounter (Signed)
Scheduled follow-up appointment per 8/14 los. Patient is aware. 

## 2021-09-06 ENCOUNTER — Inpatient Hospital Stay: Payer: BC Managed Care – PPO

## 2021-09-06 ENCOUNTER — Other Ambulatory Visit: Payer: Self-pay

## 2021-09-06 DIAGNOSIS — Z5111 Encounter for antineoplastic chemotherapy: Secondary | ICD-10-CM | POA: Diagnosis not present

## 2021-09-06 DIAGNOSIS — C8338 Diffuse large B-cell lymphoma, lymph nodes of multiple sites: Secondary | ICD-10-CM

## 2021-09-06 DIAGNOSIS — C8331 Diffuse large B-cell lymphoma, lymph nodes of head, face, and neck: Secondary | ICD-10-CM | POA: Diagnosis not present

## 2021-09-06 DIAGNOSIS — Z5112 Encounter for antineoplastic immunotherapy: Secondary | ICD-10-CM | POA: Diagnosis not present

## 2021-09-06 MED ORDER — SODIUM CHLORIDE 0.9% FLUSH
10.0000 mL | INTRAVENOUS | Status: DC | PRN
  Administered 2021-09-06: 10 mL

## 2021-09-06 MED ORDER — SODIUM CHLORIDE 0.9 % IV SOLN
50.0000 mg/m2 | Freq: Once | INTRAVENOUS | Status: AC
  Administered 2021-09-06: 100 mg via INTRAVENOUS
  Filled 2021-09-06: qty 5

## 2021-09-06 MED ORDER — PROCHLORPERAZINE MALEATE 10 MG PO TABS
10.0000 mg | ORAL_TABLET | Freq: Once | ORAL | Status: AC
  Administered 2021-09-06: 10 mg via ORAL

## 2021-09-06 MED ORDER — HEPARIN SOD (PORK) LOCK FLUSH 100 UNIT/ML IV SOLN
500.0000 [IU] | Freq: Once | INTRAVENOUS | Status: AC | PRN
  Administered 2021-09-06: 500 [IU]

## 2021-09-06 MED ORDER — SODIUM CHLORIDE 0.9 % IV SOLN: Freq: Once | INTRAVENOUS | Status: AC

## 2021-09-06 NOTE — Patient Instructions (Signed)
[facility] MEDICAL ONCOLOGY  Discharge Instructions: Thank you for choosing [facility] to provide your oncology and hematology care.   If you have a lab appointment with the Cancer Center, please go directly to the Cancer Center and check in at the registration area.   Wear comfortable clothing and clothing appropriate for easy access to any Portacath or PICC line.   We strive to give you quality time with your provider. You may need to reschedule your appointment if you arrive late (15 or more minutes).  Arriving late affects you and other patients whose appointments are after yours.  Also, if you miss three or more appointments without notifying the office, you may be dismissed from the clinic at the provider's discretion.      For prescription refill requests, have your pharmacy contact our office and allow 72 hours for refills to be completed.    Today you received the following chemotherapy and/or immunotherapy agents: Etoposide      To help prevent nausea and vomiting after your treatment, we encourage you to take your nausea medication as directed.  BELOW ARE SYMPTOMS THAT SHOULD BE REPORTED IMMEDIATELY: *FEVER GREATER THAN 100.4 F (38 C) OR HIGHER *CHILLS OR SWEATING *NAUSEA AND VOMITING THAT IS NOT CONTROLLED WITH YOUR NAUSEA MEDICATION *UNUSUAL SHORTNESS OF BREATH *UNUSUAL BRUISING OR BLEEDING *URINARY PROBLEMS (pain or burning when urinating, or frequent urination) *BOWEL PROBLEMS (unusual diarrhea, constipation, pain near the anus) TENDERNESS IN MOUTH AND THROAT WITH OR WITHOUT PRESENCE OF ULCERS (sore throat, sores in mouth, or a toothache) UNUSUAL RASH, SWELLING OR PAIN  UNUSUAL VAGINAL DISCHARGE OR ITCHING   Items with * indicate a potential emergency and should be followed up as soon as possible or go to the Emergency Department if any problems should occur.  Please show the CHEMOTHERAPY ALERT CARD or IMMUNOTHERAPY ALERT CARD at check-in to  the Emergency Department and triage nurse.  Should you have questions after your visit or need to cancel or reschedule your appointment, please contact [facility] MEDICAL ONCOLOGY  Dept: [phone]  and follow the prompts.  Office hours are 8:00 a.m. to 4:30 p.m. Monday - Friday. Please note that voicemails left after 4:00 p.m. may not be returned until the following business day.  We are closed weekends and major holidays. You have access to a nurse at all times for urgent questions. Please call the main number to the clinic Dept: [phone] and follow the prompts.   For any non-urgent questions, you may also contact your provider using MyChart. We now offer e-Visits for anyone 18 and older to request care online for non-urgent symptoms. For details visit [email or web address].   Also download the MyChart app! Go to the app store, search "MyChart", open the app, select , and log in with your MyChart username and password.  Masks are optional in the cancer centers. If you would like for your care team to wear a mask while they are taking care of you, please let them know. For doctor visits, patients may have with them one support person who is at least 68 years old. At this time, visitors are not allowed in the infusion area. 

## 2021-09-09 ENCOUNTER — Other Ambulatory Visit: Payer: Self-pay

## 2021-09-13 ENCOUNTER — Encounter: Payer: Self-pay | Admitting: Hematology

## 2021-09-26 ENCOUNTER — Inpatient Hospital Stay: Payer: BC Managed Care – PPO | Attending: Hematology and Oncology

## 2021-09-26 ENCOUNTER — Other Ambulatory Visit: Payer: Self-pay

## 2021-09-26 ENCOUNTER — Inpatient Hospital Stay: Payer: BC Managed Care – PPO | Admitting: Hematology

## 2021-09-26 ENCOUNTER — Inpatient Hospital Stay: Payer: BC Managed Care – PPO

## 2021-09-26 VITALS — BP 144/73 | HR 80 | Temp 97.6°F | Resp 17 | Ht 64.0 in | Wt 215.8 lb

## 2021-09-26 VITALS — BP 131/79 | HR 72 | Temp 97.7°F | Resp 16

## 2021-09-26 DIAGNOSIS — Z95828 Presence of other vascular implants and grafts: Secondary | ICD-10-CM

## 2021-09-26 DIAGNOSIS — Z5111 Encounter for antineoplastic chemotherapy: Secondary | ICD-10-CM | POA: Diagnosis not present

## 2021-09-26 DIAGNOSIS — C8338 Diffuse large B-cell lymphoma, lymph nodes of multiple sites: Secondary | ICD-10-CM

## 2021-09-26 DIAGNOSIS — Z5112 Encounter for antineoplastic immunotherapy: Secondary | ICD-10-CM | POA: Diagnosis not present

## 2021-09-26 DIAGNOSIS — C8331 Diffuse large B-cell lymphoma, lymph nodes of head, face, and neck: Secondary | ICD-10-CM | POA: Diagnosis not present

## 2021-09-26 LAB — CBC WITH DIFFERENTIAL (CANCER CENTER ONLY)
Abs Immature Granulocytes: 0.04 10*3/uL (ref 0.00–0.07)
Basophils Absolute: 0 10*3/uL (ref 0.0–0.1)
Basophils Relative: 1 %
Eosinophils Absolute: 0 10*3/uL (ref 0.0–0.5)
Eosinophils Relative: 1 %
HCT: 32.4 % — ABNORMAL LOW (ref 36.0–46.0)
Hemoglobin: 11.3 g/dL — ABNORMAL LOW (ref 12.0–15.0)
Immature Granulocytes: 1 %
Lymphocytes Relative: 27 %
Lymphs Abs: 2.1 10*3/uL (ref 0.7–4.0)
MCH: 32.8 pg (ref 26.0–34.0)
MCHC: 34.9 g/dL (ref 30.0–36.0)
MCV: 93.9 fL (ref 80.0–100.0)
Monocytes Absolute: 1.1 10*3/uL — ABNORMAL HIGH (ref 0.1–1.0)
Monocytes Relative: 15 %
Neutro Abs: 4.3 10*3/uL (ref 1.7–7.7)
Neutrophils Relative %: 55 %
Platelet Count: 339 10*3/uL (ref 150–400)
RBC: 3.45 MIL/uL — ABNORMAL LOW (ref 3.87–5.11)
RDW: 15.1 % (ref 11.5–15.5)
WBC Count: 7.6 10*3/uL (ref 4.0–10.5)
nRBC: 0 % (ref 0.0–0.2)

## 2021-09-26 LAB — CMP (CANCER CENTER ONLY)
ALT: 19 U/L (ref 0–44)
AST: 14 U/L — ABNORMAL LOW (ref 15–41)
Albumin: 4.2 g/dL (ref 3.5–5.0)
Alkaline Phosphatase: 77 U/L (ref 38–126)
Anion gap: 7 (ref 5–15)
BUN: 18 mg/dL (ref 8–23)
CO2: 29 mmol/L (ref 22–32)
Calcium: 9.8 mg/dL (ref 8.9–10.3)
Chloride: 106 mmol/L (ref 98–111)
Creatinine: 0.71 mg/dL (ref 0.44–1.00)
GFR, Estimated: >60 mL/min (ref >60)
Glucose, Bld: 77 mg/dL (ref 70–99)
Potassium: 3.4 mmol/L — ABNORMAL LOW (ref 3.5–5.1)
Sodium: 142 mmol/L (ref 135–145)
Total Bilirubin: 0.4 mg/dL (ref 0.3–1.2)
Total Protein: 6.4 g/dL — ABNORMAL LOW (ref 6.5–8.1)

## 2021-09-26 LAB — LACTATE DEHYDROGENASE: LDH: 189 U/L (ref 98–192)

## 2021-09-26 MED ORDER — PALONOSETRON HCL INJECTION 0.25 MG/5ML
0.2500 mg | Freq: Once | INTRAVENOUS | Status: AC
  Administered 2021-09-26: 0.25 mg via INTRAVENOUS
  Filled 2021-09-26: qty 5

## 2021-09-26 MED ORDER — METHYLPREDNISOLONE SODIUM SUCC 125 MG IJ SOLR
125.0000 mg | Freq: Once | INTRAMUSCULAR | Status: AC
  Administered 2021-09-26: 125 mg via INTRAVENOUS
  Filled 2021-09-26: qty 2

## 2021-09-26 MED ORDER — HEPARIN SOD (PORK) LOCK FLUSH 100 UNIT/ML IV SOLN
500.0000 [IU] | Freq: Once | INTRAVENOUS | Status: AC | PRN
  Administered 2021-09-26: 500 [IU]

## 2021-09-26 MED ORDER — SODIUM CHLORIDE 0.9 % IV SOLN
50.0000 mg/m2 | Freq: Once | INTRAVENOUS | Status: AC
  Administered 2021-09-26: 100 mg via INTRAVENOUS
  Filled 2021-09-26: qty 5

## 2021-09-26 MED ORDER — SODIUM CHLORIDE 0.9 % IV SOLN
375.0000 mg/m2 | Freq: Once | INTRAVENOUS | Status: AC
  Administered 2021-09-26: 800 mg via INTRAVENOUS
  Filled 2021-09-26: qty 50

## 2021-09-26 MED ORDER — MEPERIDINE HCL 25 MG/ML IJ SOLN
25.0000 mg | Freq: Once | INTRAMUSCULAR | Status: AC
  Administered 2021-09-26: 25 mg via INTRAVENOUS
  Filled 2021-09-26: qty 1

## 2021-09-26 MED ORDER — SODIUM CHLORIDE 0.9% FLUSH
10.0000 mL | Freq: Once | INTRAVENOUS | Status: AC
  Administered 2021-09-26: 10 mL

## 2021-09-26 MED ORDER — MONTELUKAST SODIUM 10 MG PO TABS
10.0000 mg | ORAL_TABLET | Freq: Once | ORAL | Status: AC
  Administered 2021-09-26: 10 mg via ORAL
  Filled 2021-09-26: qty 1

## 2021-09-26 MED ORDER — SODIUM CHLORIDE 0.9 % IV SOLN
750.0000 mg/m2 | Freq: Once | INTRAVENOUS | Status: AC
  Administered 2021-09-26: 1560 mg via INTRAVENOUS
  Filled 2021-09-26: qty 78

## 2021-09-26 MED ORDER — SODIUM CHLORIDE 0.9 % IV SOLN: Freq: Once | INTRAVENOUS | Status: AC

## 2021-09-26 MED ORDER — ACETAMINOPHEN 325 MG PO TABS
650.0000 mg | ORAL_TABLET | Freq: Once | ORAL | Status: AC
  Administered 2021-09-26: 650 mg via ORAL
  Filled 2021-09-26: qty 2

## 2021-09-26 MED ORDER — FAMOTIDINE IN NACL 20-0.9 MG/50ML-% IV SOLN
20.0000 mg | Freq: Once | INTRAVENOUS | Status: AC
  Administered 2021-09-26: 20 mg via INTRAVENOUS
  Filled 2021-09-26: qty 50

## 2021-09-26 MED ORDER — DIPHENHYDRAMINE HCL 25 MG PO CAPS
50.0000 mg | ORAL_CAPSULE | Freq: Once | ORAL | Status: AC
  Administered 2021-09-26: 50 mg via ORAL
  Filled 2021-09-26: qty 2

## 2021-09-26 MED ORDER — VINCRISTINE SULFATE CHEMO INJECTION 1 MG/ML
1.0000 mg | Freq: Once | INTRAVENOUS | Status: AC
  Administered 2021-09-26: 1 mg via INTRAVENOUS
  Filled 2021-09-26: qty 1

## 2021-09-26 MED ORDER — SODIUM CHLORIDE 0.9% FLUSH
10.0000 mL | INTRAVENOUS | Status: DC | PRN
  Administered 2021-09-26: 10 mL

## 2021-09-26 NOTE — Progress Notes (Signed)
Barbara Rhodes   HEMATOLOGY/ONCOLOGY CLINIC NOTE  Date of Service: 09/26/2021  Patient Care Team: Rosine Door as PCP - General (Physician Assistant)  CHIEF COMPLAINTS/PURPOSE OF CONSULTATION:  Follow-up for continued evaluation and management of diffuse large B-cell lymphoma  HISTORY OF PRESENTING ILLNESS:  Please see previous note for details on initial presentation  INTERVAL HISTORY:  Barbara Rhodes is a 66 y.o. female here for follow-up prior to cycle 5-day 1 of  R CEOP chemotherapy (has previously received 1 cycle of R-CVP). She reports She is doing well with no new symptoms or concerns.  She reports that she feels discontent with treatment and is excited to be done.  She notes dental cleaning scheduled for 11/20/2021.  She reports persistent pain in her right leg closer to the hip which and she takes Gabapentin and Oxycodone for pain management for her chronic back pain issues and radiculopathy related to this. No changes in pain from previous cycle. She notes reduced strength in right leg from not being able to go to the pool for aerobic exercise. We discussed trying cancer rehab center following completion of treatment.  We discussed getting repeat PET CT scan. Her repeat PET CT scan is scheduled for 11/01/2021  No uncontrolled nausea or vomiting. No change in neuropathic symptoms. No new fatigue. No fevers or chills. No abnormal bleeding or bruising. No new lumps or bumps. No other new or acute focal symptoms.  Labs done today were discussed in detail with the patient.  MEDICAL HISTORY:  Past Medical History:  Diagnosis Date   Arthritis    Breast cancer ([place]) 2000   Stage 2B, ER+/PR-/Her2-   Colon polyps    Family history of adverse reaction to anesthesia 96   Brother passed away at 83 years old from malignant hyperthermia.   Family history of breast cancer    Family history of prostate cancer    Hypertension     SURGICAL HISTORY: Past  Surgical History:  Procedure Laterality Date   ABDOMINAL HYSTERECTOMY     BACK SURGERY     BREAST SURGERY     COLONOSCOPY WITH PROPOFOL     pt said propofol burned her throat when pushed   IR US GUIDE BX ASP/DRAIN  04/03/2021   LYMPH NODE BIOPSY Left 06/02/2021   Procedure: EXCISIONAL CERVICAL LYMPH NODE BIOPSY;  Surgeon: Jovita Kussmaul, MD;  Location: [facility];  Service: General;  Laterality: Left;   MASTECTOMY Right 2000   MASTECTOMY Right 2000   PORTACATH PLACEMENT Right 06/02/2021   Procedure: INSERTION PORT-A-CATH;  Surgeon: Jovita Kussmaul, MD;  Location: [facility];  Service: General;  Laterality: Right;   POSTERIOR CERVICAL LAMINECTOMY Left 01/23/2018   Procedure: LEFT CERVICAL SEVEN- [place], MICRODISCECTOMY;  Surgeon: Jovita Gamma, MD;  Location: [facility];  Service: Neurosurgery;  Laterality: Left;   ROTATOR CUFF REPAIR     TONSILLECTOMY     TRANSFORAMINAL LUMBAR INTERBODY FUSION (TLIF) WITH PEDICLE SCREW FIXATION 2 LEVEL Right 10/30/2019   Procedure: Right Lumbar 3-4 Lumbar 4-5 Transforaminal lumbar interbody fusion;  Surgeon: Erline Levine, MD;  Location: [facility];  Service: Neurosurgery;  Laterality: Right;  3C/RM 21   TUBAL LIGATION     WISDOM TOOTH EXTRACTION      SOCIAL HISTORY: Social History   Socioeconomic History   Marital status: Married    Spouse name: Not on file   Number of children: Not on file   Years of education: Not on file   Highest education  level: Not on file  Occupational History   Not on file  Tobacco Use   Smoking status: Never   Smokeless tobacco: Never  Vaping Use   Vaping Use: Never used  Substance and Sexual Activity   Alcohol use: No   Drug use: No   Sexual activity: Not on file  Other Topics Concern   Not on file  Social History Narrative   Not on file   Social Determinants of Health   Financial Resource Strain: Not on file  Food Insecurity: Not on file  Transportation Needs: Not on file  Physical Activity:  Not on file  Stress: Not on file  Social Connections: Not on file  Intimate Partner Violence: Not on file    FAMILY HISTORY: Family History  Problem Relation Age of Onset   Breast cancer Paternal Grandmother 74   Prostate cancer Father        dx in his 75s   Colon polyps Father 32   Dementia Mother    Hypertension Mother    Dementia Maternal Aunt    Stroke Maternal Grandfather    Rheum arthritis Paternal Grandfather    Breast cancer Other        MGM's sister, post-menopausal breast cancer   Breast cancer Cousin 33       paternal 2nd cousin    ALLERGIES:  is allergic to penicillins and rituximab-pvvr.  MEDICATIONS:  Current Outpatient Medications  Medication Sig Dispense Refill   acetaminophen (TYLENOL) 500 MG tablet Take 1,000 mg by mouth daily as needed for mild pain or moderate pain.     B Complex-C (B-COMPLEX WITH VITAMIN C) tablet Take 1 tablet by mouth daily.     celecoxib (CELEBREX) 200 MG capsule Restart in 5 days (Patient taking differently: Take 200 mg by mouth daily.) 1 capsule 0   Cholecalciferol (VITAMIN D3) 50 MCG (2000 UT) capsule Take 4,000 Units by mouth daily.     docusate sodium (COLACE) 100 MG capsule Take 100 mg by mouth daily as needed for mild constipation.     LORazepam (ATIVAN) 0.5 MG tablet Take 1 tablet (0.5 mg total) by mouth every 6 (six) hours as needed (Nausea or vomiting). (Patient not taking: Reported on 06/01/2021) 30 tablet 0   Magnesium 250 MG TABS Take 250 mg by mouth daily.     Omega-3 Fatty Acids (FISH OIL ULTRA) 1400 MG CAPS Take 1,400 mg by mouth 3 (three) times a week.     ondansetron (ZOFRAN) 8 MG tablet Take 1 tablet (8 mg total) by mouth 2 (two) times daily as needed for refractory nausea / vomiting. Start on day 3 after cyclophosphamide chemotherapy. 30 tablet 1   oxyCODONE-acetaminophen (PERCOCET/ROXICET) 5-325 MG tablet Take 1 tablet by mouth every 4 (four) hours as needed.     pantoprazole (PROTONIX) 40 MG tablet Take 1 tablet (40  mg total) by mouth at bedtime. 30 tablet 3   pegfilgrastim-cbqv (UDENYCA) 6 MG/0.6ML injection 39m [place] x 1 dose -- 24 to 48 hours after completion of each cycle of chemotherapy. 0.6 mL 4   polyethylene glycol (MIRALAX / GLYCOLAX) 17 g packet Take 17 g by mouth daily. 14 each 0   Potassium 99 MG TABS Take 99 mg by mouth daily.     predniSONE (DELTASONE) 20 MG tablet Take 5 tablets (100 mg total) by mouth daily. Take with food on days 1-5 of chemotherapy. 25 tablet 5   prochlorperazine (COMPAZINE) 10 MG tablet Take 1 tablet (10 mg total) by  mouth every 6 (six) hours as needed (Nausea or vomiting). 30 tablet 6   traMADol (ULTRAM) 50 MG tablet Take 1 tablet by mouth every 6 (six) hours as needed.     triamterene-hydrochlorothiazide (MAXZIDE-25) 37.5-25 MG tablet Take 1 tablet by mouth daily.      vitamin B-12 (CYANOCOBALAMIN) 100 MCG tablet Take 100 mcg by mouth daily. 2 daily     No current facility-administered medications for this visit.    REVIEW OF SYSTEMS:   10 Point review of Systems was done is negative except as noted above.  PHYSICAL EXAMINATION: ECOG PERFORMANCE STATUS: 1 - Symptomatic but completely ambulatory Vitals:   09/26/21 0851  BP: (!) 144/73  Pulse: 80  Resp: 17  Temp: 97.6 F (36.4 C)  SpO2: 99%   NAD GENERAL:alert, in no acute distress and comfortable SKIN: no acute rashes, no significant lesions EYES: conjunctiva are pink and non-injected, sclera anicteric NECK: supple, no JVD LYMPH:  no palpable lymphadenopathy in the cervical, axillary or inguinal regions LUNGS: clear to auscultation b/l with normal respiratory effort HEART: regular rate & rhythm ABDOMEN:  normoactive bowel sounds , non tender, not distended. Extremity: no pedal edema PSYCH: alert & oriented x 3 with fluent speech NEURO: no focal motor/sensory deficits  LABORATORY DATA:  I have reviewed the data as listed .    Latest Ref Rng & Units 09/26/2021    8:31 AM 09/04/2021    8:26 AM 08/09/2021     7:59 AM  CBC  WBC 4.0 - 10.5 K/uL 7.6  7.4  7.0   Hemoglobin 12.0 - 15.0 g/dL 11.3  11.4  11.7   Hematocrit 36.0 - 46.0 % 32.4  32.4  33.6   Platelets 150 - 400 K/uL 339  340  350    .    Latest Ref Rng & Units 09/26/2021    8:31 AM 09/04/2021    8:26 AM 08/09/2021    7:59 AM  CMP  Glucose 70 - 99 mg/dL 77  93  94   BUN 8 - 23 mg/dL 18  21  23    Creatinine 0.44 - 1.00 mg/dL 0.71  0.80  0.77   Sodium 135 - 145 mmol/L 142  142  140   Potassium 3.5 - 5.1 mmol/L 3.4  3.4  3.6   Chloride 98 - 111 mmol/L 106  105  104   CO2 22 - 32 mmol/L 29  31  28    Calcium 8.9 - 10.3 mg/dL 9.8  9.5  9.9   Total Protein 6.5 - 8.1 g/dL 6.4  6.3  6.6   Total Bilirubin 0.3 - 1.2 mg/dL 0.4  0.4  0.4   Alkaline Phos 38 - 126 U/L 77  73  80   AST 15 - 41 U/L 14  17  17    ALT 0 - 44 U/L 19  21  30     . Lab Results  Component Value Date   LDH 189 09/26/2021    SURGICAL PATHOLOGY  CASE: [id number]  PATIENT: Barbara Rhodes  Surgical Pathology Report   Clinical History: remote history of breast cancer, now with  indeterminate left supraclavicular lymphadenopathy (cm)   FINAL MICROSCOPIC DIAGNOSIS:   A. LYMPH NODE, LEFT SUPRACLAVICULAR, NEEDLE CORE BIOPSY:  -Atypical lymphoid proliferation  -See comment   COMMENT:   The sections show several very small needle core biopsy fragments of  apparently lymph nodal tissue displaying an atypical lymphoid  proliferation of primarily large lymphoid appearing cells characterized  by  vesicular chromatin and prominent nucleoli associated with scattered  mitosis.  The appearance is apparently diffuse with lack of atypical  follicles.  Flow cytometric analysis was performed [facility] [id number]) and  shows predominance of T lymphocytes with nonspecific changes.  No  monoclonal B-cell population identified.  A battery of  immunohistochemical stains was performed but the tissue is prominently  exhausted on deeper sectioning.  Scattered residual very minute   fragments show that the lymphoid appearing cells are positive for LCA,  CD20, CD79a, BCL6, MUM1, and Bcl-2.  No significant staining is seen  with CD138, CD10, CD34, cyclin D1, S100, cytokeratin AE1/AE3,  cytokeratin 8/18, or GATA3.  There is an admixed T-cell population to a  lesser extent as seen with CD3 and CD5 and there is no apparent  co-expression of CD5 in B-cell areas.  The following stains were  performed but there is no optimal tissue present on deeper sectioning  for evaluation including CD30, TdT, and in situ hybridization for kappa,  lambda and EBV.  The overall findings are limited but very concerning  for involvement by a B-cell lymphoproliferative process particularly  large cell lymphoma.  Excisional biopsy is recommended to further  evaluate this process.   SURGICAL PATHOLOGY  CASE: [id number]  PATIENT: Barbara Rhodes  Surgical Pathology Report      Clinical History: Cervical lymphocyte; history of Hodgkin's lymphoma  ([facility])    FINAL MICROSCOPIC DIAGNOSIS:   A. LYMPH NODE, LEFT NECK, EXCISION:   -  Diffuse large B-cell lymphoma, activated B-cell subtype  -  See comment   COMMENT:   The excisional biopsy consists of an enlarged lymph node with complete  effacement by a large pleomorphic population of lymphoid cells with  vesicular chromatin and prominent nucleoli.  By immunohistochemistry,  the atypical lymphocytes are positive for CD20, PAX5, Bcl-2, BCL6 and  Mum-1.  The cells are negative for CD5, CD10, CD30 and EBV by in situ  hybridization.  The proliferative rate by Ki-67 is 60-70%.  CD3  highlights background benign-appearing lymphocytes.  Overall, the  findings are consistent with a diffuse large B cell lymphoma, activated  B-cell phenotype.  Preliminary results of this case were discussed with  Dr. Jenell Milliner on Jun 06, 2021.  FISH studies (high-grade/large B-cell  lymphoma) are pending and will be reported in an addendum.   RADIOGRAPHIC  STUDIES: I have personally reviewed the radiological images as listed and agreed with the findings in the report. No results found.  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, sports with previous history of right-sided breast cancer status post right mastectomy and adjuvant chemotherapy including doxorubicin now with  #1 stage III/IV diffuse large B-cell lymphoma.-Activated B-cell subtype High risk lymphoma FISH panel no evidence of double or triple hit lymphoma Patient received cycle 1 of R-CVP Has completed 1 cycle of R-CVP as inpatient She is currently on cycle 4 of R-CEOP   #2 anemia due to lymphoma involvement of bone marrow #3 status post Port-A-Cath placement #4 thrombocytopenia secondary to #1 #5 high risk of tumor lysis syndrome #6 anxiety #7 remote history of right breast cancer status postmastectomy and adjuvant chemotherapy #8 rigors with Rituxan dose being At 200 mg/h  Plan -Labs done today were discussed in detail with the patient. CBC and CMP stable LDH slightly elevated likely from G-CSF use No new toxicities from her current chemoimmunotherapy with R-CEOP. We will continue the same regimen with same supportive medications at this time.   Continue Rituxan infusion with peak rates  of 300 mg/h with same premedications. Patient will continue Udenyca growth factor support at home on day 5 of each cycle of treatment. -Continue to premedicate with her prednisone for 2 days prior to her treatment and take the remaining prednisone for 3 days after her treatment with additional famotidine and an antihistamine to reduce risk of Rituxan related reactions. -Continue reduced dosage vincristine 1 mg cap. -We will get repeat PET CT scan after her fifth cycle of R CEOP i.e. 6 cycles of chemotherapy.   Follow-up RTC with Dr Irene Limbo with portflush and labs in 6 weeks PET/CT as scheduled on 11/01/2021   The total time spent in the appointment was 30 minutes*.  All of the patient's  questions were answered with apparent satisfaction. The patient knows to call the clinic with any problems, questions or concerns.   Sullivan Lone MD MS AAHIVMS [facility] Hematology/Oncology Physician [facility]  .*Total Encounter Time as defined by the Centers for Medicare and Medicaid Services includes, in addition to the face-to-face time of a patient visit (documented in the note above) non-face-to-face time: obtaining and reviewing outside history, ordering and reviewing medications, tests or procedures, care coordination (communications with other health care professionals or caregivers) and documentation in the medical record.  I, Melene Muller, am acting as scribe for Dr. Sullivan Lone, MD.  .I have reviewed the above documentation for accuracy and completeness, and I agree with the above.Brunetta Genera MD

## 2021-09-26 NOTE — Patient Instructions (Signed)
[facility] ONCOLOGY  Discharge Instructions: Thank you for choosing [facility] to provide your oncology and hematology care.   If you have a lab appointment with the [facility], please go directly to the [facility] and check in at the registration area.   Wear comfortable clothing and clothing appropriate for easy access to any Portacath or PICC line.   We strive to give you quality time with your provider. You may need to reschedule your appointment if you arrive late (15 or more minutes).  Arriving late affects you and other patients whose appointments are after yours.  Also, if you miss three or more appointments without notifying the office, you may be dismissed from the clinic at the provider's discretion.      For prescription refill requests, have your pharmacy contact our office and allow 72 hours for refills to be completed.    Today you received the following chemotherapy and/or immunotherapy agents: Vincristine, Cytoxan, Etoposide, and Rituximab      To help prevent nausea and vomiting after your treatment, we encourage you to take your nausea medication as directed.  BELOW ARE SYMPTOMS THAT SHOULD BE REPORTED IMMEDIATELY: *FEVER GREATER THAN 100.4 F (38 C) OR HIGHER *CHILLS OR SWEATING *NAUSEA AND VOMITING THAT IS NOT CONTROLLED WITH YOUR NAUSEA MEDICATION *UNUSUAL SHORTNESS OF BREATH *UNUSUAL BRUISING OR BLEEDING *URINARY PROBLEMS (pain or burning when urinating, or frequent urination) *BOWEL PROBLEMS (unusual diarrhea, constipation, pain near the anus) TENDERNESS IN MOUTH AND THROAT WITH OR WITHOUT PRESENCE OF ULCERS (sore throat, sores in mouth, or a toothache) UNUSUAL RASH, SWELLING OR PAIN  UNUSUAL VAGINAL DISCHARGE OR ITCHING   Items with * indicate a potential emergency and should be followed up as soon as possible or go to the Emergency Department if any problems should occur.  Please show the CHEMOTHERAPY ALERT CARD or  IMMUNOTHERAPY ALERT CARD at check-in to the Emergency Department and triage nurse.  Should you have questions after your visit or need to cancel or reschedule your appointment, please contact [facility]  Dept: [phone]  and follow the prompts.  Office hours are 8:00 a.m. to 4:30 p.m. Monday - Friday. Please note that voicemails left after 4:00 p.m. may not be returned until the following business day.  We are closed weekends and major holidays. You have access to a nurse at all times for urgent questions. Please call the main number to the clinic Dept: [phone] and follow the prompts.   For any non-urgent questions, you may also contact your provider using MyChart. We now offer e-Visits for anyone 7 and older to request care online for non-urgent symptoms. For details visit [email or web address].   Also download the MyChart app! Go to the app store, search "MyChart", open the app, select [facility], and log in with your MyChart username and password.  Due to Covid, a mask is required upon entering the hospital/clinic. If you do not have a mask, one will be given to you upon arrival. For doctor visits, patients may have 1 support person aged 52 or older with them. For treatment visits, patients cannot have anyone with them due to current Covid guidelines and our immunocompromised population.

## 2021-09-27 ENCOUNTER — Inpatient Hospital Stay: Payer: BC Managed Care – PPO

## 2021-09-27 DIAGNOSIS — Z5111 Encounter for antineoplastic chemotherapy: Secondary | ICD-10-CM | POA: Diagnosis not present

## 2021-09-27 DIAGNOSIS — C8331 Diffuse large B-cell lymphoma, lymph nodes of head, face, and neck: Secondary | ICD-10-CM | POA: Diagnosis not present

## 2021-09-27 DIAGNOSIS — C8338 Diffuse large B-cell lymphoma, lymph nodes of multiple sites: Secondary | ICD-10-CM

## 2021-09-27 DIAGNOSIS — Z5112 Encounter for antineoplastic immunotherapy: Secondary | ICD-10-CM | POA: Diagnosis not present

## 2021-09-27 MED ORDER — SODIUM CHLORIDE 0.9 % IV SOLN
50.0000 mg/m2 | Freq: Once | INTRAVENOUS | Status: AC
  Administered 2021-09-27: 100 mg via INTRAVENOUS
  Filled 2021-09-27: qty 5

## 2021-09-27 MED ORDER — HEPARIN SOD (PORK) LOCK FLUSH 100 UNIT/ML IV SOLN
500.0000 [IU] | Freq: Once | INTRAVENOUS | Status: AC | PRN
  Administered 2021-09-27: 500 [IU]

## 2021-09-27 MED ORDER — SODIUM CHLORIDE 0.9 % IV SOLN: Freq: Once | INTRAVENOUS | Status: AC

## 2021-09-27 MED ORDER — SODIUM CHLORIDE 0.9% FLUSH
10.0000 mL | INTRAVENOUS | Status: DC | PRN
  Administered 2021-09-27: 10 mL

## 2021-09-27 MED ORDER — PROCHLORPERAZINE MALEATE 10 MG PO TABS
10.0000 mg | ORAL_TABLET | Freq: Once | ORAL | Status: AC
  Administered 2021-09-27: 10 mg via ORAL
  Filled 2021-09-27: qty 1

## 2021-09-28 ENCOUNTER — Inpatient Hospital Stay: Payer: BC Managed Care – PPO

## 2021-09-28 VITALS — BP 130/74 | HR 87 | Temp 98.0°F | Resp 16

## 2021-09-28 DIAGNOSIS — C8331 Diffuse large B-cell lymphoma, lymph nodes of head, face, and neck: Secondary | ICD-10-CM | POA: Diagnosis not present

## 2021-09-28 DIAGNOSIS — Z5112 Encounter for antineoplastic immunotherapy: Secondary | ICD-10-CM | POA: Diagnosis not present

## 2021-09-28 DIAGNOSIS — C8338 Diffuse large B-cell lymphoma, lymph nodes of multiple sites: Secondary | ICD-10-CM

## 2021-09-28 DIAGNOSIS — Z5111 Encounter for antineoplastic chemotherapy: Secondary | ICD-10-CM | POA: Diagnosis not present

## 2021-09-28 MED ORDER — SODIUM CHLORIDE 0.9 % IV SOLN
50.0000 mg/m2 | Freq: Once | INTRAVENOUS | Status: AC
  Administered 2021-09-28: 100 mg via INTRAVENOUS
  Filled 2021-09-28: qty 5

## 2021-09-28 MED ORDER — SODIUM CHLORIDE 0.9 % IV SOLN: Freq: Once | INTRAVENOUS | Status: AC

## 2021-09-28 MED ORDER — PROCHLORPERAZINE MALEATE 10 MG PO TABS
10.0000 mg | ORAL_TABLET | Freq: Once | ORAL | Status: AC
  Administered 2021-09-28: 10 mg via ORAL
  Filled 2021-09-28: qty 1

## 2021-09-28 MED ORDER — SODIUM CHLORIDE 0.9% FLUSH
10.0000 mL | INTRAVENOUS | Status: DC | PRN
  Administered 2021-09-28: 10 mL

## 2021-09-28 MED ORDER — HEPARIN SOD (PORK) LOCK FLUSH 100 UNIT/ML IV SOLN
500.0000 [IU] | Freq: Once | INTRAVENOUS | Status: AC | PRN
  Administered 2021-09-28: 500 [IU]

## 2021-09-28 NOTE — Patient Instructions (Signed)
[facility]  Discharge Instructions: ?Thank you for choosing [facility] to provide your oncology and hematology care.  ? ?If you have a lab appointment with the Cancer Center, please go directly to the Cancer Center and check in at the registration area. ?  ?Wear comfortable clothing and clothing appropriate for easy access to any Portacath or PICC line.  ? ?We strive to give you quality time with your provider. You may need to reschedule your appointment if you arrive late (15 or more minutes).  Arriving late affects you and other patients whose appointments are after yours.  Also, if you miss three or more appointments without notifying the office, you may be dismissed from the clinic at the provider?s discretion.    ?  ?For prescription refill requests, have your pharmacy contact our office and allow 72 hours for refills to be completed.   ? ?Today you received the following chemotherapy and/or immunotherapy agents: Etoposide.     ?  ?To help prevent nausea and vomiting after your treatment, we encourage you to take your nausea medication as directed. ? ?BELOW ARE SYMPTOMS THAT SHOULD BE REPORTED IMMEDIATELY: ?*FEVER GREATER THAN 100.4 F (38 ?C) OR HIGHER ?*CHILLS OR SWEATING ?*NAUSEA AND VOMITING THAT IS NOT CONTROLLED WITH YOUR NAUSEA MEDICATION ?*UNUSUAL SHORTNESS OF BREATH ?*UNUSUAL BRUISING OR BLEEDING ?*URINARY PROBLEMS (pain or burning when urinating, or frequent urination) ?*BOWEL PROBLEMS (unusual diarrhea, constipation, pain near the anus) ?TENDERNESS IN MOUTH AND THROAT WITH OR WITHOUT PRESENCE OF ULCERS (sore throat, sores in mouth, or a toothache) ?UNUSUAL RASH, SWELLING OR PAIN  ?UNUSUAL VAGINAL DISCHARGE OR ITCHING  ? ?Items with * indicate a potential emergency and should be followed up as soon as possible or go to the Emergency Department if any problems should occur. ? ?Please show the CHEMOTHERAPY ALERT CARD or IMMUNOTHERAPY ALERT CARD at check-in  to the Emergency Department and triage nurse. ? ?Should you have questions after your visit or need to cancel or reschedule your appointment, please contact [facility]  Dept: [phone]  and follow the prompts.  Office hours are 8:00 a.m. to 4:30 p.m. Monday - Friday. Please note that voicemails left after 4:00 p.m. may not be returned until the following business day.  We are closed weekends and major holidays. You have access to a nurse at all times for urgent questions. Please call the main number to the clinic Dept: [phone] and follow the prompts. ? ? ?For any non-urgent questions, you may also contact your provider using MyChart. We now offer e-Visits for anyone 18 and older to request care online for non-urgent symptoms. For details visit [email or web address]. ?  ?Also download the MyChart app! Go to the app store, search "MyChart", open the app, select [facility], and log in with your MyChart username and password. ? ?Due to Covid, a mask is required upon entering the hospital/clinic. If you do not have a mask, one will be given to you upon arrival. For doctor visits, patients may have 1 support person aged 18 or older with them. For treatment visits, patients cannot have anyone with them due to current Covid guidelines and our immunocompromised population.  ? ?

## 2021-10-01 ENCOUNTER — Encounter: Payer: Self-pay | Admitting: Physician Assistant

## 2021-10-01 ENCOUNTER — Encounter: Payer: Self-pay | Admitting: Hematology

## 2021-10-02 ENCOUNTER — Ambulatory Visit: Payer: BC Managed Care – PPO | Admitting: Podiatry

## 2021-10-04 ENCOUNTER — Ambulatory Visit: Payer: BC Managed Care – PPO | Admitting: Podiatry

## 2021-10-04 DIAGNOSIS — C8510 Unspecified B-cell lymphoma, unspecified site: Secondary | ICD-10-CM | POA: Diagnosis not present

## 2021-10-04 DIAGNOSIS — M4316 Spondylolisthesis, lumbar region: Secondary | ICD-10-CM | POA: Diagnosis not present

## 2021-10-04 DIAGNOSIS — I10 Essential (primary) hypertension: Secondary | ICD-10-CM | POA: Diagnosis not present

## 2021-10-04 DIAGNOSIS — E876 Hypokalemia: Secondary | ICD-10-CM | POA: Diagnosis not present

## 2021-10-10 ENCOUNTER — Other Ambulatory Visit: Payer: Self-pay

## 2021-10-10 DIAGNOSIS — C8590 Non-Hodgkin lymphoma, unspecified, unspecified site: Secondary | ICD-10-CM

## 2021-10-10 MED ORDER — PEGFILGRASTIM-CBQV 6 MG/0.6ML ~~LOC~~ SOSY: PREFILLED_SYRINGE | SUBCUTANEOUS | 4 refills | Status: DC

## 2021-10-16 DIAGNOSIS — D2262 Melanocytic nevi of left upper limb, including shoulder: Secondary | ICD-10-CM | POA: Diagnosis not present

## 2021-10-16 DIAGNOSIS — L570 Actinic keratosis: Secondary | ICD-10-CM | POA: Diagnosis not present

## 2021-10-16 DIAGNOSIS — D2261 Melanocytic nevi of right upper limb, including shoulder: Secondary | ICD-10-CM | POA: Diagnosis not present

## 2021-10-16 DIAGNOSIS — D224 Melanocytic nevi of scalp and neck: Secondary | ICD-10-CM | POA: Diagnosis not present

## 2021-11-01 ENCOUNTER — Encounter (HOSPITAL_COMMUNITY)
Admission: RE | Admit: 2021-11-01 | Discharge: 2021-11-01 | Disposition: A | Discharge status: Home / Self Care | Payer: BC Managed Care – PPO | Source: Ambulatory Visit | Attending: Hematology | Admitting: Hematology

## 2021-11-01 DIAGNOSIS — C8338 Diffuse large B-cell lymphoma, lymph nodes of multiple sites: Secondary | ICD-10-CM | POA: Diagnosis not present

## 2021-11-01 DIAGNOSIS — C8330 Diffuse large B-cell lymphoma, unspecified site: Secondary | ICD-10-CM | POA: Diagnosis not present

## 2021-11-01 LAB — GLUCOSE, CAPILLARY: Glucose-Capillary: 114 mg/dL — ABNORMAL HIGH (ref 70–99)

## 2021-11-01 MED ORDER — FLUDEOXYGLUCOSE F - 18 (FDG) INJECTION
11.0000 | Freq: Once | INTRAVENOUS | Status: AC
  Administered 2021-11-01: 10.7 via INTRAVENOUS

## 2021-11-06 ENCOUNTER — Inpatient Hospital Stay: Payer: BC Managed Care – PPO | Attending: Hematology and Oncology | Admitting: Hematology

## 2021-11-06 ENCOUNTER — Inpatient Hospital Stay: Payer: BC Managed Care – PPO

## 2021-11-06 ENCOUNTER — Other Ambulatory Visit: Payer: Self-pay

## 2021-11-06 VITALS — BP 129/68 | HR 73 | Temp 98.1°F | Resp 18 | Wt 215.2 lb

## 2021-11-06 DIAGNOSIS — C8338 Diffuse large B-cell lymphoma, lymph nodes of multiple sites: Secondary | ICD-10-CM

## 2021-11-06 DIAGNOSIS — Z95828 Presence of other vascular implants and grafts: Secondary | ICD-10-CM

## 2021-11-06 DIAGNOSIS — C8331 Diffuse large B-cell lymphoma, lymph nodes of head, face, and neck: Secondary | ICD-10-CM | POA: Diagnosis not present

## 2021-11-06 LAB — CBC WITH DIFFERENTIAL (CANCER CENTER ONLY)
Abs Immature Granulocytes: 0.02 10*3/uL (ref 0.00–0.07)
Basophils Absolute: 0 10*3/uL (ref 0.0–0.1)
Basophils Relative: 1 %
Eosinophils Absolute: 0.3 10*3/uL (ref 0.0–0.5)
Eosinophils Relative: 5 %
HCT: 35.6 % — ABNORMAL LOW (ref 36.0–46.0)
Hemoglobin: 12.5 g/dL (ref 12.0–15.0)
Immature Granulocytes: 0 %
Lymphocytes Relative: 26 %
Lymphs Abs: 1.4 10*3/uL (ref 0.7–4.0)
MCH: 32.9 pg (ref 26.0–34.0)
MCHC: 35.1 g/dL (ref 30.0–36.0)
MCV: 93.7 fL (ref 80.0–100.0)
Monocytes Absolute: 0.5 10*3/uL (ref 0.1–1.0)
Monocytes Relative: 9 %
Neutro Abs: 3.1 10*3/uL (ref 1.7–7.7)
Neutrophils Relative %: 59 %
Platelet Count: 229 10*3/uL (ref 150–400)
RBC: 3.8 MIL/uL — ABNORMAL LOW (ref 3.87–5.11)
RDW: 13.2 % (ref 11.5–15.5)
WBC Count: 5.3 10*3/uL (ref 4.0–10.5)
nRBC: 0 % (ref 0.0–0.2)

## 2021-11-06 LAB — CMP (CANCER CENTER ONLY)
ALT: 18 U/L (ref 0–44)
AST: 18 U/L (ref 15–41)
Albumin: 4.1 g/dL (ref 3.5–5.0)
Alkaline Phosphatase: 82 U/L (ref 38–126)
Anion gap: 6 (ref 5–15)
BUN: 16 mg/dL (ref 8–23)
CO2: 29 mmol/L (ref 22–32)
Calcium: 9.2 mg/dL (ref 8.9–10.3)
Chloride: 106 mmol/L (ref 98–111)
Creatinine: 0.74 mg/dL (ref 0.44–1.00)
GFR, Estimated: >60 mL/min (ref >60)
Glucose, Bld: 117 mg/dL — ABNORMAL HIGH (ref 70–99)
Potassium: 3.8 mmol/L (ref 3.5–5.1)
Sodium: 141 mmol/L (ref 135–145)
Total Bilirubin: 0.5 mg/dL (ref 0.3–1.2)
Total Protein: 6.4 g/dL — ABNORMAL LOW (ref 6.5–8.1)

## 2021-11-06 LAB — MAGNESIUM: Magnesium: 1.9 mg/dL (ref 1.7–2.4)

## 2021-11-06 LAB — LACTATE DEHYDROGENASE: LDH: 175 U/L (ref 98–192)

## 2021-11-06 MED ORDER — SODIUM CHLORIDE 0.9% FLUSH
10.0000 mL | Freq: Once | INTRAVENOUS | Status: AC
  Administered 2021-11-06: 10 mL

## 2021-11-06 MED ORDER — HEPARIN SOD (PORK) LOCK FLUSH 100 UNIT/ML IV SOLN
500.0000 [IU] | Freq: Once | INTRAVENOUS | Status: AC
  Administered 2021-11-06: 500 [IU]

## 2021-11-06 NOTE — Progress Notes (Signed)
Marland Kitchen   HEMATOLOGY/ONCOLOGY CLINIC NOTE  Date of Service: 11/06/21   Patient Care Team: Rosine Door as PCP - General (Physician Assistant)  CHIEF COMPLAINTS/PURPOSE OF CONSULTATION:  Follow-up for continued evaluation and management of diffuse large B-cell lymphoma  HISTORY OF PRESENTING ILLNESS:  Please see previous note for details on initial presentation  INTERVAL HISTORY:  Barbara Rhodes is a 66 y.o. female here for continued evaluation of diffuse large B-cell lymphoma.   She was last seen by me on 11/06/2021 was doing well with no new symptoms or concerns.  She is here with her husband during today's visit. She reports she has been doing well overall and is interested in removing her port.   She reports she has been having tingling sensation and mild nerve pain on her right leg. However, she reports that this is not a new concern. She reports she has been going to PT and will schedule an appointment with spine specialist to manage her lost of feeling on right leg and nerve deafness.  She regularly takes Vitamin-D 5,000 mg supplements.    She is not interested in receiving influenza vaccine and COVID-19 booster.   MEDICAL HISTORY:  Past Medical History:  Diagnosis Date   Arthritis    Breast cancer ([place]) 2000   Stage 2B, ER+/PR-/Her2-   Colon polyps    Family history of adverse reaction to anesthesia 63   Brother passed away at 66 years old from malignant hyperthermia.   Family history of breast cancer    Family history of prostate cancer    Hypertension     SURGICAL HISTORY: Past Surgical History:  Procedure Laterality Date   ABDOMINAL HYSTERECTOMY     BACK SURGERY     BREAST SURGERY     COLONOSCOPY WITH PROPOFOL     pt said propofol burned her throat when pushed   IR US GUIDE BX ASP/DRAIN  04/03/2021   LYMPH NODE BIOPSY Left 06/02/2021   Procedure: EXCISIONAL CERVICAL LYMPH NODE BIOPSY;  Surgeon: Jovita Kussmaul, MD;  Location: [facility];   Service: General;  Laterality: Left;   MASTECTOMY Right 2000   MASTECTOMY Right 2000   PORTACATH PLACEMENT Right 06/02/2021   Procedure: INSERTION PORT-A-CATH;  Surgeon: Jovita Kussmaul, MD;  Location: [facility];  Service: General;  Laterality: Right;   POSTERIOR CERVICAL LAMINECTOMY Left 01/23/2018   Procedure: LEFT CERVICAL SEVEN- [place], MICRODISCECTOMY;  Surgeon: Jovita Gamma, MD;  Location: [place];  Service: Neurosurgery;  Laterality: Left;   ROTATOR CUFF REPAIR     TONSILLECTOMY     TRANSFORAMINAL LUMBAR INTERBODY FUSION (TLIF) WITH PEDICLE SCREW FIXATION 2 LEVEL Right 10/30/2019   Procedure: Right Lumbar 3-4 Lumbar 4-5 Transforaminal lumbar interbody fusion;  Surgeon: Erline Levine, MD;  Location: [place];  Service: Neurosurgery;  Laterality: Right;  3C/RM 21   TUBAL LIGATION     WISDOM TOOTH EXTRACTION      SOCIAL HISTORY: Social History   Socioeconomic History   Marital status: Married    Spouse name: Not on file   Number of children: Not on file   Years of education: Not on file   Highest education level: Not on file  Occupational History   Not on file  Tobacco Use   Smoking status: Never   Smokeless tobacco: Never  Vaping Use   Vaping Use: Never used  Substance and Sexual Activity   Alcohol use: No   Drug use: No   Sexual activity: Not on  file  Other Topics Concern   Not on file  Social History Narrative   Not on file   Social Determinants of Health   Financial Resource Strain: Not on file  Food Insecurity: Not on file  Transportation Needs: Not on file  Physical Activity: Not on file  Stress: Not on file  Social Connections: Not on file  Intimate Partner Violence: Not on file    FAMILY HISTORY: Family History  Problem Relation Age of Onset   Breast cancer Paternal Grandmother 51   Prostate cancer Father        dx in his 19s   Colon polyps Father 56   Dementia Mother    Hypertension Mother    Dementia Maternal Aunt     Stroke Maternal Grandfather    Rheum arthritis Paternal Grandfather    Breast cancer Other        MGM's sister, post-menopausal breast cancer   Breast cancer Cousin 68       paternal 2nd cousin    ALLERGIES:  is allergic to penicillins and rituximab-pvvr.  MEDICATIONS:  Current Outpatient Medications  Medication Sig Dispense Refill   acetaminophen (TYLENOL) 500 MG tablet Take 1,000 mg by mouth daily as needed for mild pain or moderate pain.     B Complex-C (B-COMPLEX WITH VITAMIN C) tablet Take 1 tablet by mouth daily.     celecoxib (CELEBREX) 200 MG capsule Restart in 5 days (Patient taking differently: Take 200 mg by mouth daily.) 1 capsule 0   Cholecalciferol (VITAMIN D3) 50 MCG (2000 UT) capsule Take 4,000 Units by mouth daily.     docusate sodium (COLACE) 100 MG capsule Take 100 mg by mouth daily as needed for mild constipation.     LORazepam (ATIVAN) 0.5 MG tablet Take 1 tablet (0.5 mg total) by mouth every 6 (six) hours as needed (Nausea or vomiting). (Patient not taking: Reported on 06/01/2021) 30 tablet 0   Magnesium 250 MG TABS Take 250 mg by mouth daily.     Omega-3 Fatty Acids (FISH OIL ULTRA) 1400 MG CAPS Take 1,400 mg by mouth 3 (three) times a week.     ondansetron (ZOFRAN) 8 MG tablet Take 1 tablet (8 mg total) by mouth 2 (two) times daily as needed for refractory nausea / vomiting. Start on day 3 after cyclophosphamide chemotherapy. 30 tablet 1   oxyCODONE-acetaminophen (PERCOCET/ROXICET) 5-325 MG tablet Take 1 tablet by mouth every 4 (four) hours as needed.     pantoprazole (PROTONIX) 40 MG tablet Take 1 tablet (40 mg total) by mouth at bedtime. 30 tablet 3   pegfilgrastim-cbqv (UDENYCA) 6 MG/0.6ML injection 12m [place] x 1 dose -- 24 to 48 hours after completion of each cycle of chemotherapy. 0.6 mL 4   polyethylene glycol (MIRALAX / GLYCOLAX) 17 g packet Take 17 g by mouth daily. 14 each 0   Potassium 99 MG TABS Take 99 mg by mouth daily.     predniSONE (DELTASONE) 20 MG  tablet Take 5 tablets (100 mg total) by mouth daily. Take with food on days 1-5 of chemotherapy. 25 tablet 5   prochlorperazine (COMPAZINE) 10 MG tablet Take 1 tablet (10 mg total) by mouth every 6 (six) hours as needed (Nausea or vomiting). 30 tablet 6   traMADol (ULTRAM) 50 MG tablet Take 1 tablet by mouth every 6 (six) hours as needed.     triamterene-hydrochlorothiazide (MAXZIDE-25) 37.5-25 MG tablet Take 1 tablet by mouth daily.      vitamin B-12 (CYANOCOBALAMIN) 100  MCG tablet Take 100 mcg by mouth daily. 2 daily     No current facility-administered medications for this visit.    REVIEW OF SYSTEMS:   .10 Point review of Systems was done is negative except as noted above.   PHYSICAL EXAMINATION: ECOG PERFORMANCE STATUS: 1 - Symptomatic but completely ambulatory Vitals:   11/06/21 1016  BP: 129/68  Pulse: 73  Resp: 18  Temp: 98.1 F (36.7 C)  SpO2: 96%   . GENERAL:alert, in no acute distress and comfortable SKIN: no acute rashes, no significant lesions EYES: conjunctiva are pink and non-injected, sclera anicteric OROPHARYNX: MMM, no exudates, no oropharyngeal erythema or ulceration NECK: supple, no JVD LYMPH:  no palpable lymphadenopathy in the cervical, axillary or inguinal regions LUNGS: clear to auscultation b/l with normal respiratory effort HEART: regular rate & rhythm ABDOMEN:  normoactive bowel sounds , non tender, not distended. Extremity: no pedal edema PSYCH: alert & oriented x 3 with fluent speech NEURO: no focal motor/sensory deficits   LABORATORY DATA:  I have reviewed the data as listed .    Latest Ref Rng & Units 11/06/2021    9:28 AM 09/26/2021    8:31 AM 09/04/2021    8:26 AM  CBC  WBC 4.0 - 10.5 K/uL 5.3  7.6  7.4   Hemoglobin 12.0 - 15.0 g/dL 12.5  11.3  11.4   Hematocrit 36.0 - 46.0 % 35.6  32.4  32.4   Platelets 150 - 400 K/uL 229  339  340    .    Latest Ref Rng & Units 11/06/2021    9:28 AM 09/26/2021    8:31 AM 09/04/2021    8:26 AM   CMP  Glucose 70 - 99 mg/dL 117  77  93   BUN 8 - 23 mg/dL 16  18  21    Creatinine 0.44 - 1.00 mg/dL 0.74  0.71  0.80   Sodium 135 - 145 mmol/L 141  142  142   Potassium 3.5 - 5.1 mmol/L 3.8  3.4  3.4   Chloride 98 - 111 mmol/L 106  106  105   CO2 22 - 32 mmol/L 29  29  31    Calcium 8.9 - 10.3 mg/dL 9.2  9.8  9.5   Total Protein 6.5 - 8.1 g/dL 6.4  6.4  6.3   Total Bilirubin 0.3 - 1.2 mg/dL 0.5  0.4  0.4   Alkaline Phos 38 - 126 U/L 82  77  73   AST 15 - 41 U/L 18  14  17    ALT 0 - 44 U/L 18  19  21     . Lab Results  Component Value Date   LDH 175 11/06/2021    SURGICAL PATHOLOGY  CASE: [id number]  PATIENT: Alexys Oakley  Surgical Pathology Report   Clinical History: remote history of breast cancer, now with  indeterminate left supraclavicular lymphadenopathy (cm)   FINAL MICROSCOPIC DIAGNOSIS:   A. LYMPH NODE, LEFT SUPRACLAVICULAR, NEEDLE CORE BIOPSY:  -Atypical lymphoid proliferation  -See comment   COMMENT:   The sections show several very small needle core biopsy fragments of  apparently lymph nodal tissue displaying an atypical lymphoid  proliferation of primarily large lymphoid appearing cells characterized  by vesicular chromatin and prominent nucleoli associated with scattered  mitosis.  The appearance is apparently diffuse with lack of atypical  follicles.  Flow cytometric analysis was performed [facility] [id number]) and  shows predominance of T lymphocytes with nonspecific changes.  No  monoclonal B-cell  population identified.  A battery of  immunohistochemical stains was performed but the tissue is prominently  exhausted on deeper sectioning.  Scattered residual very minute  fragments show that the lymphoid appearing cells are positive for LCA,  CD20, CD79a, BCL6, MUM1, and Bcl-2.  No significant staining is seen  with CD138, CD10, CD34, cyclin D1, S100, cytokeratin AE1/AE3,  cytokeratin 8/18, or GATA3.  There is an admixed T-cell population to a  lesser  extent as seen with CD3 and CD5 and there is no apparent  co-expression of CD5 in B-cell areas.  The following stains were  performed but there is no optimal tissue present on deeper sectioning  for evaluation including CD30, TdT, and in situ hybridization for kappa,  lambda and EBV.  The overall findings are limited but very concerning  for involvement by a B-cell lymphoproliferative process particularly  large cell lymphoma.  Excisional biopsy is recommended to further  evaluate this process.   SURGICAL PATHOLOGY  CASE: [id number]  PATIENT: Berkley Harvey  Surgical Pathology Report      Clinical History: Cervical lymphocyte; history of Hodgkin's lymphoma  (c[facility])    FINAL MICROSCOPIC DIAGNOSIS:   A. LYMPH NODE, LEFT NECK, EXCISION:   -  Diffuse large B-cell lymphoma, activated B-cell subtype  -  See comment   COMMENT:   The excisional biopsy consists of an enlarged lymph node with complete  effacement by a large pleomorphic population of lymphoid cells with  vesicular chromatin and prominent nucleoli.  By immunohistochemistry,  the atypical lymphocytes are positive for CD20, PAX5, Bcl-2, BCL6 and  Mum-1.  The cells are negative for CD5, CD10, CD30 and EBV by in situ  hybridization.  The proliferative rate by Ki-67 is 60-70%.  CD3  highlights background benign-appearing lymphocytes.  Overall, the  findings are consistent with a diffuse large B cell lymphoma, activated  B-cell phenotype.  Preliminary results of this case were discussed with  Dr. Jenell Milliner on Jun 06, 2021.  FISH studies (high-grade/large B-cell  lymphoma) are pending and will be reported in an addendum.   RADIOGRAPHIC STUDIES: I have personally reviewed the radiological images as listed and agreed with the findings in the report. NM PET Image Restag (PS) Skull Base To Thigh  Result Date: 11/02/2021 CLINICAL DATA:  Subsequent treatment strategy for large B-cell lymphoma. EXAM: NUCLEAR MEDICINE PET SKULL  BASE TO THIGH TECHNIQUE: 10.7 mCi F-18 FDG was injected intravenously. Full-ring PET imaging was performed from the skull base to thigh after the radiotracer. CT data was obtained and used for attenuation correction and anatomic localization. Fasting blood glucose: 114 mg/dl COMPARISON:  Prior PET CTs 05/10/2021 and 07/31/2021 FINDINGS: Mediastinal blood pool activity: SUV max 2.64 Liver activity: SUV max 3.84 NECK: No hypermetabolic lymph nodes in the neck. Very small scattered lower neck nodes but no residual hypermetabolism. Incidental CT findings: None. CHEST: No hypermetabolic mediastinal or hilar nodes. No suspicious pulmonary nodules on the CT scan. Status post right mastectomy. No hypermetabolic breast masses or axillary adenopathy. Incidental CT findings: None. ABDOMEN/PELVIS: No abnormal hypermetabolic activity within the liver, pancreas, adrenal glands, or spleen. No hypermetabolic lymph nodes in the abdomen or pelvis. No residual measurable lymph nodes in the abdomen or pelvis. Incidental CT findings: Stable cholelithiasis. SKELETON: No focal hypermetabolic activity to suggest skeletal metastasis. Resolution of hypermetabolic bone marrow. Incidental CT findings: None. IMPRESSION: Findings for complete metabolic response. No residual measurable hypermetabolic lymphadenopathy to suggest residual lymphoma ([place] 1). Electronically Signed   By: Ricky Stabs.D.  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, sports with previous history of right-sided breast cancer status post right mastectomy and adjuvant chemotherapy including doxorubicin now with:  #1 stage III/IV diffuse large B-cell lymphoma.-Activated B-cell subtype High risk lymphoma FISH panel no evidence of double or triple hit lymphoma #2 anemia due to lymphoma involvement of bone marrow #3 status post Port-A-Cath placement #4 thrombocytopenia secondary to #1 #5 high risk of tumor lysis syndrome #6 anxiety #7  remote history of right breast cancer status postmastectomy and adjuvant chemotherapy #8 rigors with Rituxan dose being At 200 mg/h  Plan: -Labs done today, 11/06/2021, were discussed in detail with the patient and her husband. Labs showed WBC level of 5.3 K, hemoglobin of 12.5 K, and platelets of 229.  -Her PET/CT scans were discussed with the patient and her husband. Her PET scan showed no abnormal results and shows complete radiographic remission. -patient will call Dr Marlou Starks for port a cath removal since she has now completed her planned R-CEOP and is in CR.. -discussed survivorship plan. -discussed recommendations for vaccinations. -Will follow-up every 3 months.    Follow-up: RTC with Dr Irene Limbo with labs in 3 months.   The total time spent in the appointment was 32 minutes* .  All of the patient's questions were answered with apparent satisfaction. The patient knows to call the clinic with any problems, questions or concerns.   I,Param Shah,acting as a Education administrator for Sullivan Lone, MD.,have documented all relevant documentation on the behalf of Sullivan Lone, MD,as directed by  Sullivan Lone, MD while in the presence of Sullivan Lone, MD.  Sullivan Lone MD [place] AAHIVMS [facility] Hematology/Oncology Physician [facility]  .*Total Encounter Time as defined by the Centers for Medicare and Medicaid Services includes, in addition to the face-to-face time of a patient visit (documented in the note above) non-face-to-face time: obtaining and reviewing outside history, ordering and reviewing medications, tests or procedures, care coordination (communications with other health care professionals or caregivers) and documentation in the medical record.

## 2021-11-12 ENCOUNTER — Encounter: Payer: Self-pay | Admitting: Hematology

## 2021-11-16 ENCOUNTER — Other Ambulatory Visit: Payer: Self-pay

## 2021-11-20 DIAGNOSIS — M48061 Spinal stenosis, lumbar region without neurogenic claudication: Secondary | ICD-10-CM | POA: Diagnosis not present

## 2021-11-20 DIAGNOSIS — M21371 Foot drop, right foot: Secondary | ICD-10-CM | POA: Diagnosis not present

## 2021-11-20 DIAGNOSIS — Z6837 Body mass index (BMI) 37.0-37.9, adult: Secondary | ICD-10-CM | POA: Diagnosis not present

## 2021-11-28 ENCOUNTER — Other Ambulatory Visit: Payer: Self-pay

## 2021-11-30 NOTE — Progress Notes (Signed)
Per Dr. Gordy Councilman anesthesia note 06/02/2021, patient classified as ASA 4. Left message with Hassan Rowan at [facility]'s office for surgery to be moved to Main OR.

## 2021-12-07 ENCOUNTER — Other Ambulatory Visit: Payer: Self-pay

## 2021-12-07 ENCOUNTER — Encounter (HOSPITAL_COMMUNITY): Payer: Self-pay | Admitting: General Surgery

## 2021-12-07 ENCOUNTER — Ambulatory Visit: Payer: Self-pay | Admitting: General Surgery

## 2021-12-07 NOTE — Anesthesia Preprocedure Evaluation (Addendum)
Anesthesia Evaluation  Patient identified by MRN, date of birth, ID band Patient awake    Reviewed: Allergy & Precautions, NPO status , Patient's Chart, lab work & pertinent test results  History of Anesthesia Complications (+) Family history of anesthesia reaction and history of anesthetic complications (Brother died at the age of 45 from [facility])  Airway Mallampati: II  TM Distance: >3 FB Neck ROM: Full    Dental no notable dental hx.    Pulmonary neg pulmonary ROS   Pulmonary exam normal breath sounds clear to auscultation       Cardiovascular hypertension, (-) angina (-) Past MI, (-) Cardiac Stents, (-) CABG and (-) Orthopnea  Rhythm:Regular Rate:Normal     Neuro/Psych neg Seizures  Neuromuscular disease (lumbar radiculopathy)    GI/Hepatic negative GI ROS, Neg liver ROS,,,  Endo/Other  negative endocrine ROS    Renal/GU negative Renal ROS     Musculoskeletal  (+) Arthritis ,    Abdominal  (+) - obese  Peds  Hematology negative hematology ROS (+)   Anesthesia Other Findings   Reproductive/Obstetrics                              Anesthesia Physical Anesthesia Plan  ASA: 4  Anesthesia Plan: MAC   Post-op Pain Management:    Induction:   PONV Risk Score and Plan: 2  Airway Management Planned: Natural Airway and Nasal Cannula  Additional Equipment:   Intra-op Plan:   Post-operative Plan:   Informed Consent: I have reviewed the patients History and Physical, chart, labs and discussed the procedure including the risks, benefits and alternatives for the proposed anesthesia with the patient or authorized representative who has indicated his/her understanding and acceptance.   Patient has DNR.  Discussed DNR with patient and Continue DNR.   Dental advisory given  Plan Discussed with: Anesthesiologist  Anesthesia Plan Comments: (The patient prefers local only if okay with Dr.  Marlou Starks. Discussed sedation as well. The patient wants to continue her DNR to include no shocks, chest compressions, or long-term intubation.  PAT note by Karoline Caldwell, PA-C: Follows with hematology/oncology for treatment of diffuse large B-cell lymphoma.  She also has history of right-sided breast cancer s/p right mastectomy and adjuvant chemotherapy.  She has recently completed chemotherapy for her lymphoma and is in clinical remission per last note 11/06/2021.  Family history of malignant hyperthermia; brother reportedly died at age 69 of [place] from halothane. Will follow MH precautions.  CMP and CBC from 11/07/2018 reviewed, unremarkable.  EKG 10/27/2019: NSR.  Rate 78.  Possible LAE.  LAD.  TTE 06/01/2021:  1. Left ventricular ejection fraction, by estimation, is 60 to 65%. Left  ventricular ejection fraction by PLAX is 62 %. The left ventricle has  normal function. The left ventricle has no regional wall motion  abnormalities. Left ventricular diastolic  parameters are consistent with Grade I diastolic dysfunction (impaired  relaxation).   2. Right ventricular systolic function is hyperdynamic. The right  ventricular size is normal. There is normal pulmonary artery systolic  pressure. The estimated right ventricular systolic pressure is 36.4 mmHg.   3. The mitral valve is normal in structure. No evidence of mitral valve  regurgitation.   4. The aortic valve is tricuspid. Aortic valve regurgitation is not  visualized.   5. The inferior vena cava is normal in size with greater than 50%  respiratory variability, suggesting right atrial pressure of 3 mmHg.  6. Cannot exclude a small PFO. Atrial septum is aneruysmal.    )         Anesthesia Quick Evaluation

## 2021-12-07 NOTE — Progress Notes (Addendum)
PCP - Clemmie Krill, PA-C Cardiologist - n/a  PPM/ICD - pt denies Device Orders - n/a Rep Notified - n/a  Chest x-ray - n/a EKG - 11/02/2019 Stress Test - n/a ECHO - 06/01/2021 Cardiac Cath - denies  Sleep Study - denies CPAP - n/a  Diabetic-pt denies Blood Thinner Instructions:pt denies Aspirin Instructions:pt denies  ERAS Protcol -yes PRE-SURGERY Ensure or G2- NO  COVID TEST- n/a   Anesthesia review: sent secure chat to Karoline Caldwell, PA-C. Family hx malignant hyperthermia.   Patient denies shortness of breath, fever, cough and chest pain at PAT phone call appointment   All instructions explained to the patient, with a verbal understanding of the material. Patient agrees to go over the instructions while at home for a better understanding. Patient also instructed to self quarantine after being tested for COVID-19. The opportunity to ask questions was provided.  During phone call pt states she request Local anesthesia with Dr.Toth so she would not need a driver DOS. I explained that she needs to have someone available to driver her home after surgery if needed. Pt verbalizes understanding.

## 2021-12-07 NOTE — Progress Notes (Signed)
Anesthesia Chart Review: Same day workup  Follows with hematology/oncology for treatment of diffuse large B-cell lymphoma.  She also has history of right-sided breast cancer s/p right mastectomy and adjuvant chemotherapy.  She has recently completed chemotherapy for her lymphoma and is in clinical remission per last note 11/06/2021.  Family history of malignant hyperthermia; brother reportedly died at age 68 of [place] from halothane.  CMP and CBC from 11/07/2018 reviewed, unremarkable.  EKG 10/27/2019: NSR.  Rate 78.  Possible LAE.  LAD.  TTE 06/01/2021:  1. Left ventricular ejection fraction, by estimation, is 60 to 65%. Left  ventricular ejection fraction by PLAX is 62 %. The left ventricle has  normal function. The left ventricle has no regional wall motion  abnormalities. Left ventricular diastolic  parameters are consistent with Grade I diastolic dysfunction (impaired  relaxation).   2. Right ventricular systolic function is hyperdynamic. The right  ventricular size is normal. There is normal pulmonary artery systolic  pressure. The estimated right ventricular systolic pressure is 16.1 mmHg.   3. The mitral valve is normal in structure. No evidence of mitral valve  regurgitation.   4. The aortic valve is tricuspid. Aortic valve regurgitation is not  visualized.   5. The inferior vena cava is normal in size with greater than 50%  respiratory variability, suggesting right atrial pressure of 3 mmHg.   6. Cannot exclude a small PFO. Atrial septum is [facility] Short Stay [facility]/Anesthesiology Phone [phone] 12/07/2021 3:42 PM

## 2021-12-08 ENCOUNTER — Encounter (HOSPITAL_COMMUNITY): Payer: Self-pay | Admitting: General Surgery

## 2021-12-08 ENCOUNTER — Ambulatory Visit (HOSPITAL_COMMUNITY)
Admission: RE | Admit: 2021-12-08 | Discharge: 2021-12-08 | Disposition: A | Discharge status: Home / Self Care | Payer: BC Managed Care – PPO | Attending: General Surgery | Admitting: General Surgery

## 2021-12-08 ENCOUNTER — Ambulatory Visit (HOSPITAL_COMMUNITY): Payer: BC Managed Care – PPO | Admitting: Certified Registered"

## 2021-12-08 ENCOUNTER — Encounter (HOSPITAL_COMMUNITY)
Admission: RE | Disposition: A | Discharge status: Home / Self Care | Payer: Self-pay | Source: Home / Self Care | Attending: General Surgery

## 2021-12-08 DIAGNOSIS — I10 Essential (primary) hypertension: Secondary | ICD-10-CM | POA: Insufficient documentation

## 2021-12-08 DIAGNOSIS — M1990 Unspecified osteoarthritis, unspecified site: Secondary | ICD-10-CM | POA: Diagnosis not present

## 2021-12-08 DIAGNOSIS — M5416 Radiculopathy, lumbar region: Secondary | ICD-10-CM | POA: Insufficient documentation

## 2021-12-08 DIAGNOSIS — Z8572 Personal history of non-Hodgkin lymphomas: Secondary | ICD-10-CM | POA: Insufficient documentation

## 2021-12-08 DIAGNOSIS — I890 Lymphedema, not elsewhere classified: Secondary | ICD-10-CM | POA: Diagnosis not present

## 2021-12-08 DIAGNOSIS — Z452 Encounter for adjustment and management of vascular access device: Secondary | ICD-10-CM | POA: Insufficient documentation

## 2021-12-08 HISTORY — PX: PORT-A-CATH REMOVAL: SHX5289

## 2021-12-08 LAB — CBC
HCT: 38.2 % (ref 36.0–46.0)
Hemoglobin: 12.9 g/dL (ref 12.0–15.0)
MCH: 31.4 pg (ref 26.0–34.0)
MCHC: 33.8 g/dL (ref 30.0–36.0)
MCV: 92.9 fL (ref 80.0–100.0)
Platelets: 240 10*3/uL (ref 150–400)
RBC: 4.11 MIL/uL (ref 3.87–5.11)
RDW: 12.5 % (ref 11.5–15.5)
WBC: 4.4 10*3/uL (ref 4.0–10.5)
nRBC: 0 % (ref 0.0–0.2)

## 2021-12-08 LAB — BASIC METABOLIC PANEL WITH GFR
Anion gap: 11 (ref 5–15)
BUN: 15 mg/dL (ref 8–23)
CO2: 25 mmol/L (ref 22–32)
Calcium: 9.3 mg/dL (ref 8.9–10.3)
Chloride: 103 mmol/L (ref 98–111)
Creatinine, Ser: 0.81 mg/dL (ref 0.44–1.00)
GFR, Estimated: >60 mL/min
Glucose, Bld: 111 mg/dL — ABNORMAL HIGH (ref 70–99)
Potassium: 3.4 mmol/L — ABNORMAL LOW (ref 3.5–5.1)
Sodium: 139 mmol/L (ref 135–145)

## 2021-12-08 SURGERY — REMOVAL PORT-A-CATH
Anesthesia: Monitor Anesthesia Care | Site: Chest

## 2021-12-08 MED ORDER — CHLORHEXIDINE GLUCONATE CLOTH 2 % EX PADS: 6.0000 | MEDICATED_PAD | Freq: Once | CUTANEOUS | Status: DC

## 2021-12-08 MED ORDER — PROMETHAZINE HCL 25 MG/ML IJ SOLN: 6.2500 mg | INTRAMUSCULAR | Status: DC | PRN

## 2021-12-08 MED ORDER — FENTANYL CITRATE (PF) 100 MCG/2ML IJ SOLN: 25.0000 ug | INTRAMUSCULAR | Status: DC | PRN

## 2021-12-08 MED ORDER — CHLORHEXIDINE GLUCONATE 0.12 % MT SOLN
OROMUCOSAL | Status: AC
  Administered 2021-12-08: 15 mL via OROMUCOSAL
  Filled 2021-12-08: qty 15

## 2021-12-08 MED ORDER — LACTATED RINGERS IV SOLN: INTRAVENOUS | Status: DC

## 2021-12-08 MED ORDER — LIDOCAINE-EPINEPHRINE 1 %-1:100000 IJ SOLN
INTRAMUSCULAR | Status: AC
  Filled 2021-12-08: qty 1

## 2021-12-08 MED ORDER — OXYCODONE HCL 5 MG/5ML PO SOLN: 5.0000 mg | Freq: Once | ORAL | Status: DC | PRN

## 2021-12-08 MED ORDER — CHLORHEXIDINE GLUCONATE 0.12 % MT SOLN: 15.0000 mL | Freq: Once | OROMUCOSAL | Status: AC

## 2021-12-08 MED ORDER — ORAL CARE MOUTH RINSE: 15.0000 mL | Freq: Once | OROMUCOSAL | Status: AC

## 2021-12-08 MED ORDER — LIDOCAINE-EPINEPHRINE 1 %-1:100000 IJ SOLN
INTRAMUSCULAR | Status: DC | PRN
  Administered 2021-12-08: 10 mL

## 2021-12-08 MED ORDER — OXYCODONE HCL 5 MG PO TABS: 5.0000 mg | ORAL_TABLET | Freq: Once | ORAL | Status: DC | PRN

## 2021-12-08 MED ORDER — 0.9 % SODIUM CHLORIDE (POUR BTL) OPTIME
TOPICAL | Status: DC | PRN
  Administered 2021-12-08: 1000 mL

## 2021-12-08 SURGICAL SUPPLY — 24 items
APPLICATOR CHLORAPREP 10.5 ORG (MISCELLANEOUS) ×1 IMPLANT
COVER SURGICAL LIGHT HANDLE (MISCELLANEOUS) ×1 IMPLANT
DERMABOND ADVANCED .7 DNX12 (GAUZE/BANDAGES/DRESSINGS) ×1 IMPLANT
DRAPE LAPAROTOMY 100X72 PEDS (DRAPES) ×1 IMPLANT
ELECT CAUTERY BLADE 6.4 (BLADE) ×1 IMPLANT
ELECT REM PT RETURN 9FT ADLT (ELECTROSURGICAL) ×1
ELECTRODE REM PT RTRN 9FT ADLT (ELECTROSURGICAL) ×1 IMPLANT
GAUZE 4X4 16PLY ~~LOC~~+RFID DBL (SPONGE) ×1 IMPLANT
GLOVE BIO SURGEON STRL SZ7.5 (GLOVE) ×1 IMPLANT
GOWN STRL REUS W/ TWL LRG LVL3 (GOWN DISPOSABLE) ×2 IMPLANT
GOWN STRL REUS W/TWL LRG LVL3 (GOWN DISPOSABLE) ×2
KIT BASIN OR (CUSTOM PROCEDURE TRAY) ×1 IMPLANT
KIT TURNOVER KIT B (KITS) ×1 IMPLANT
NDL HYPO 25GX1X1/2 BEV (NEEDLE) ×1 IMPLANT
NEEDLE HYPO 25GX1X1/2 BEV (NEEDLE) ×1 IMPLANT
NS IRRIG 1000ML POUR BTL (IV SOLUTION) ×1 IMPLANT
PACK GENERAL/GYN (CUSTOM PROCEDURE TRAY) ×1 IMPLANT
PAD ARMBOARD 7.5X6 YLW CONV (MISCELLANEOUS) ×2 IMPLANT
SUT MNCRL AB 4-0 PS2 18 (SUTURE) ×1 IMPLANT
SUT VIC AB 3-0 SH 27 (SUTURE) ×1
SUT VIC AB 3-0 SH 27X BRD (SUTURE) ×1 IMPLANT
SYR CONTROL 10ML LL (SYRINGE) ×1 IMPLANT
TOWEL GREEN STERILE (TOWEL DISPOSABLE) ×1 IMPLANT
TOWEL GREEN STERILE FF (TOWEL DISPOSABLE) ×1 IMPLANT

## 2021-12-08 NOTE — Op Note (Signed)
12/08/2021  10:32 AM  PATIENT:  Barbara Rhodes  66 y.o. female  PRE-OPERATIVE DIAGNOSIS:  LYMPHOMA  POST-OPERATIVE DIAGNOSIS:  LYMPHOMA  PROCEDURE:  Procedure(s): REMOVAL PORT-A-CATH (N/A)  SURGEON:  Surgeon(s) and Role:    * Jovita Kussmaul, MD - Primary  PHYSICIAN ASSISTANT:   ASSISTANTS: none   ANESTHESIA:   local  EBL:  1 mL   BLOOD ADMINISTERED:none  DRAINS: none   LOCAL MEDICATIONS USED:  LIDOCAINE   SPECIMEN:  No Specimen  DISPOSITION OF SPECIMEN:  N/A  COUNTS:  YES  TOURNIQUET:  * No tourniquets in log *  DICTATION: .Dragon Dictation  After informed consent was obtained the patient was brought to the operating room supine position on the operating table.  The patient's right neck and chest area were then prepped with ChloraPrep, allowed to dry, and draped in usual sterile manner.  An appropriate timeout was performed.  The area around the port was infiltrated with 1% lidocaine until a good field block was created.  A small incision was made through her previous incision with a 15 blade knife.  The incision was carried through the subcutaneous tissue sharply with a 15 blade knife until the capsule surrounding the port was opened.  The 2 anchoring stitches were divided and removed.  The port was then gently pushed out of its pocket and with gentle traction was removed from the patient without difficulty.  Pressure was held for several minutes until the area was completely hemostatic.  The tubing tract site was then closed with a figure-of-eight 3-0 Vicryl stitch.  The subcutaneous tissue was closed with interrupted 3-0 Vicryl stitches.  The skin was closed with interrupted 4-0 Monocryl subcuticular stitches.  Dermabond dressings were applied.  The patient tolerated the procedure well.  At the end of the case all needle sponge and instrument counts were correct.  The patient was then awakened and taken to recovery in stable condition.  PLAN OF CARE: Discharge  to home after PACU  PATIENT DISPOSITION:  PACU - hemodynamically stable.   Delay start of Pharmacological VTE agent (>24hrs) due to surgical blood loss or risk of bleeding: not applicable

## 2021-12-08 NOTE — Progress Notes (Signed)
Patient didn't receive any sedation/ anesthesia only local to remove porta cath. MD is aware she is driving home.

## 2021-12-08 NOTE — H&P (Addendum)
MRN: [id number] DOB: October 14, 1955 Subjective   Chief Complaint: New Consultation (B cell lymphoma)   History of Present Illness: Barbara Rhodes is a 66 y.o. female who is seen today as an office consultation for evaluation of New Consultation (B cell lymphoma) .   Consent for evaluation of left cervical adenopathy. She has had left cervical adenopathy for the last 3 to 4 months she thinks. Her work-up has been somewhat protracted. She has a history of right breast cancer and is BRCA positive. Back in 2000 had a right mastectomy with chemotherapy. She has had bilateral oophorectomies. She is declined left mastectomy. She has developed significant cervical adenopathy on the left side. PET scan shows a large conglomeration of lymph nodes in her left cervical chain region level 2 to level 3 region. These are quite superficial. She had a core biopsy done in March which showed a B-cell lymphoma. She desires care in [place] and wishes to see a Psychologist, sport and exercise and medical oncologist for treatment of this down here. Is unclear to me what her tumor markers are since her work was done in the [facility] system in [place]. These results were reviewed. It is unclear if she requires any more lymph node tissue. Denies any history of night sweats, weight loss or cough.  Review of Systems: A complete review of systems was obtained from the patient. I have reviewed this information and discussed as appropriate with the patient. See HPI as well for other ROS.    Medical History: Past Medical History:  Diagnosis Date   Arthritis   History of cancer   Hypertension   There is no problem list on file for this patient.  Past Surgical History:  Procedure Laterality Date   ANTERIOR FUSION CERVICAL SPINE   ARTHROSCOPIC ROTATOR CUFF REPAIR Bilateral   LAPAROSCOPIC OOPHORECTOMY   LAPAROSCOPIC TUBAL LIGATION   MASTECTOMY    Allergies  Allergen Reactions   Penicillins Anaphylaxis  DID THE REACTION INVOLVE: Swelling of  the face/tongue/throat, SOB, or low BP? Yes Sudden or severe rash/hives, skin peeling, or the inside of the mouth or nose? No Did it require medical treatment? Yes When did it last happen?      63 years ago If all above answers are "NO", may proceed with cephalosporin use. DID THE REACTION INVOLVE: Swelling of the face/tongue/throat, SOB, or low BP? Yes Sudden or severe rash/hives, skin peeling, or the inside of the mouth or nose? No Did it require medical treatment? Yes When did it last happen?      63 years ago If all above answers are "NO", may proceed with cephalosporin use.   Current Outpatient Medications on File Prior to Visit  Medication Sig Dispense Refill   celecoxib (CELEBREX) 200 MG capsule celecoxib 200 mg capsule   triamterene-hydroCHLOROthiazide (MAXZIDE-25) 37.5-25 mg tablet triamterene 37.5 mg-hydrochlorothiazide 25 mg tablet TAKE 1 TABLET BY MOUTH EVERY DAY   No current facility-administered medications on file prior to visit.   Family History  Problem Relation Age of Onset   High blood pressure (Hypertension) Mother   Obesity Father    Social History   Tobacco Use  Smoking Status Never  Smokeless Tobacco Never    Social History   Socioeconomic History   Marital status: Married  Tobacco Use   Smoking status: Never   Smokeless tobacco: Never  Vaping Use   Vaping Use: Never used  Substance and Sexual Activity   Alcohol use: Not Currently   Drug use: Never   Objective:   Vitals:  BP: 138/74  Pulse: 103  SpO2: 98%  Weight: 94.6 kg (208 lb 9.6 oz)  Height: 160 cm (_0 )   Body mass index is 36.95 kg/m.  Physical Exam Constitutional:  Appearance: Normal appearance.  HENT:  Head: Normocephalic.  Mouth/Throat:  Mouth: Mucous membranes are moist.  Neck:  Comments: Large bulky adenopathy involving zone 2 and zone 3 lymph nodes overlying the clavicle. These are mobile. No stridor or tracheal deviation noted. No right cervical chain lymph nodes  that I can palpate today. Cardiovascular:  Rate and Rhythm: Normal rate.  Pulmonary:  Effort: Pulmonary effort is normal.  Breath sounds: No stridor.  Musculoskeletal:  General: Normal range of motion.  Lymphadenopathy:  Cervical: Cervical adenopathy present.  Skin: General: Skin is warm.  Neurological:  General: No focal deficit present.  Mental Status: She is alert.  Psychiatric:  Mood and Affect: Mood normal.  Behavior: Behavior normal.     Labs, Imaging and Diagnostic Testing: CLINICAL DATA: Initial treatment strategy for lymphoma.   EXAM: NUCLEAR MEDICINE PET SKULL BASE TO THIGH   TECHNIQUE: 10.4 mCi F-18 FDG was injected intravenously. Full-ring PET imaging was performed from the skull base to thigh after the radiotracer. CT data was obtained and used for attenuation correction and anatomic localization.   Fasting blood glucose: 125 mg/dl   COMPARISON: Comparison made with CT of the neck of March 10, 2021.   FINDINGS: Mediastinal blood pool activity: SUV max 3.10   Liver activity: SUV max 4.81   NECK: Bulky adenopathy in the LEFT neck tracking through the thoracic inlet. (Image 42/4) adenopathy is nearly confluent with the largest individual lymph node measuring 25 mm short axis previously approximately 14 mm.   (Image 34/4) 20 mm lymph node in the LEFT supraclavicular region/low neck along the posterior margin of the sternocleidomastoid was previously approximately 13 mm. Enlarged lymph nodes extend along the length of the LEFT clavicle with a maximum SUV of 23.9 in this vicinity. Lymph nodes extend to the level of the hyoid in the LEFT neck and into the superior mediastinum along the LEFT neck involving posterior supraclavicular and low cervical triangle lymph nodes as well.   Incidental CT findings: None   CHEST: Enlarging mediastinal and axillary lymph nodes.   (Image 58/4) 2 cm anterior mediastinal/prevascular lymph node with a maximum SUV  of 2.6, previously less than a cm. Adjacent AP window lymph node with similar SUV values measures 16 mm short axis (image 61/4)   (Image 65/4) maximum SUV of a subcarinal lymph node is 21, lymph node measuring 12 mm   LEFT infrahilar/juxta aortic adenopathy largest measuring 20 mm short axis showing similar SUV values with lymph nodes tracking along the LEFT and RIGHT of the aorta into the retrocrural region and into the abdomen. No suspicious pulmonary nodule.   Incidental CT findings: No consolidation or pleural effusion. Aortic atherosclerosis. No aneurysm. No pericardial fluid. Limited assessment of cardiovascular structures given lack of intravenous contrast. Signs of RIGHT mastectomy and axillary dissection without increased metabolic activity in the RIGHT axilla.   ABDOMEN/PELVIS: Bulky upper abdominal and retroperitoneal adenopathy. Celiac lymph node (image 101/4) 25 mm with a maximum SUV of 22.63   Similarly FDG avid lymph nodes tracking in the intra-aortocaval groove and along the LEFT periaortic chain largest as much as 3 cm short axis (image 123/4) slightly smaller lymph nodes in the LEFT periaortic chain, lymph nodes extending to the level of the iliac bifurcation.   Paste that   No  solid organ involvement. No pelvic adenopathy.   Incidental CT findings: No acute findings relative to liver, gallbladder, pancreas, adrenal glands or kidneys. Spleen without increased metabolic activity beyond metabolic activity in the liver. Splenic size 11 cm.   Urinary bladder is collapsed. No acute gastrointestinal process.   SKELETON: Diffuse marrow uptake of FDG involving the marrow spaces with the maximum SUV for example at the T12 level of 6.7 T8 level displaying a maximum SUV of 6.2. Bilateral iliac crest with similar increased metabolic activity relatively homogeneous marrow space activity throughout the spine and pelvis, also seen in the bilateral proximal femora and to  a lesser extent in the proximal humeri.   Incidental CT findings: Signs of spinal fusion spanning levels L3 through L5 with interbody cage placement. Degenerative changes throughout the spine. No focal osseous abnormality on CT.   IMPRESSION: Findings of adenopathy in the neck, chest and in the abdomen in this patient with reported history of lymphoma, categorized as Deauville criteria 5 disease.   No signs of splenomegaly, top-normal size spleen with splenic activity very close to hepatic activity but not exceeding hepatic activity, attention on follow-up.   Diffuse marrow uptake of FDG which in the absence of marrow stimulation is suspicious for marrow space involvement.   Signs of RIGHT mastectomy and axillary dissection in this patient with history of breast cancer.   Cholelithiasis.     Electronically Signed By: Zetta Bills M.D. On: 05/10/2021 14:43  Assessment and Plan:   Diagnoses and all orders for this visit:  Lymphadenopathy of left cervical region - Ambulatory Referral to Oncology-Medical  Diffuse large B-cell lymphoma of lymph nodes of neck (CMS-HCC) - Ambulatory Referral to Oncology-Medical    Refer to medical oncology for evaluation. I do not know if she needs any further lymph node biopsies. If she does, we can schedule her for a left cervical lymph node biopsy. Further care will be dictated by medical oncology at this point in time. I discussed the pros and cons of surgery and risk of bleeding, infection, injury to nerves to the upper extremity or neck, injury to major blood vessels, loss of use of left arm if this happens, and potential other comorbidities of lymph node biopsy. She would like the area to be gone but explained to her chemotherapy is could be more effective and there is no role for surgery to treat that. Surgery is only for diagnostic purposes in the circumstance. She understands the above risk. We will have her see oncology first and then do  surgery second if she needs for the lymph node tissue removed.   She presents now for port removal

## 2021-12-08 NOTE — Anesthesia Postprocedure Evaluation (Signed)
Anesthesia Post Note  Patient: Barbara Rhodes  Procedure(s) Performed: REMOVAL PORT-A-CATH (Chest)     Patient location during evaluation: PACU Anesthesia Type: MAC Level of consciousness: awake Pain management: pain level controlled Vital Signs Assessment: post-procedure vital signs reviewed and stable Respiratory status: spontaneous breathing, nonlabored ventilation and respiratory function stable Cardiovascular status: stable and blood pressure returned to baseline Postop Assessment: no apparent nausea or vomiting Anesthetic complications: no   No notable events documented.  Last Vitals:  Vitals:   12/08/21 1045 12/08/21 1100  BP: (!) 168/84 (!) 157/75  Pulse: 87 87  Resp: 18 15  Temp:  36.7 C  SpO2: 100% 97%    Last Pain:  Vitals:   12/08/21 1100  TempSrc:   PainSc: 0-No pain                 Nilda Simmer

## 2021-12-08 NOTE — Anesthesia Procedure Notes (Signed)
Procedure Name: MAC Date/Time: 12/08/2021 10:10 AM  Performed by: Michele Rockers, CRNAPre-anesthesia Checklist: Patient identified, Emergency Drugs available, Suction available, Timeout performed and Patient being monitored Patient Re-evaluated:Patient Re-evaluated prior to induction Oxygen Delivery Method: Nasal Cannula

## 2021-12-08 NOTE — Interval H&P Note (Signed)
History and Physical Interval Note:  12/08/2021 9:33 AM  Barbara Rhodes  has presented today for surgery, with the diagnosis of LYMPHOMA.  The various methods of treatment have been discussed with the patient and family. After consideration of risks, benefits and other options for treatment, the patient has consented to  Procedure(s): REMOVAL PORT-A-CATH (N/A) as a surgical intervention.  The patient's history has been reviewed, patient examined, no change in status, stable for surgery.  I have reviewed the patient's chart and labs.  Questions were answered to the patient's satisfaction.     Autumn Messing III

## 2021-12-08 NOTE — Transfer of Care (Signed)
Immediate Anesthesia Transfer of Care Note  Patient: Allona Gondek  Procedure(s) Performed: REMOVAL PORT-A-CATH (Chest)  Patient Location: PACU  Anesthesia Type:MAC  Level of Consciousness: awake, alert , oriented, patient cooperative, and responds to stimulation  Airway & Oxygen Therapy: Patient Spontanous Breathing and Patient connected to nasal cannula oxygen  Post-op Assessment: Report given to RN, Post -op Vital signs reviewed and stable, and Patient moving all extremities X 4  Post vital signs: Reviewed and stable  Last Vitals:  Vitals Value Taken Time  BP 153/86 12/08/21 1041  Temp 36.5 C 12/08/21 1039  Pulse 90 12/08/21 1043  Resp 26 12/08/21 1043  SpO2 100 % 12/08/21 1043  Vitals shown include unvalidated device data.  Last Pain:  Vitals:   12/08/21 1039  TempSrc:   PainSc: 0-No pain         Complications: No notable events documented.

## 2021-12-11 ENCOUNTER — Encounter (HOSPITAL_COMMUNITY): Payer: Self-pay | Admitting: General Surgery

## 2021-12-20 DIAGNOSIS — M21371 Foot drop, right foot: Secondary | ICD-10-CM | POA: Diagnosis not present

## 2021-12-25 DIAGNOSIS — M21371 Foot drop, right foot: Secondary | ICD-10-CM | POA: Diagnosis not present

## 2021-12-28 DIAGNOSIS — M21371 Foot drop, right foot: Secondary | ICD-10-CM | POA: Diagnosis not present

## 2022-01-02 DIAGNOSIS — M21371 Foot drop, right foot: Secondary | ICD-10-CM | POA: Diagnosis not present

## 2022-01-04 DIAGNOSIS — M6281 Muscle weakness (generalized): Secondary | ICD-10-CM | POA: Diagnosis not present

## 2022-01-09 ENCOUNTER — Other Ambulatory Visit: Payer: Self-pay | Admitting: Pharmacist

## 2022-01-09 DIAGNOSIS — M6281 Muscle weakness (generalized): Secondary | ICD-10-CM | POA: Diagnosis not present

## 2022-01-09 DIAGNOSIS — H353112 Nonexudative age-related macular degeneration, right eye, intermediate dry stage: Secondary | ICD-10-CM | POA: Diagnosis not present

## 2022-01-18 DIAGNOSIS — M21371 Foot drop, right foot: Secondary | ICD-10-CM | POA: Diagnosis not present

## 2022-01-25 DIAGNOSIS — M21371 Foot drop, right foot: Secondary | ICD-10-CM | POA: Diagnosis not present

## 2022-02-01 DIAGNOSIS — M21371 Foot drop, right foot: Secondary | ICD-10-CM | POA: Diagnosis not present

## 2022-02-05 ENCOUNTER — Other Ambulatory Visit: Payer: Self-pay

## 2022-02-05 DIAGNOSIS — C8338 Diffuse large B-cell lymphoma, lymph nodes of multiple sites: Secondary | ICD-10-CM

## 2022-02-06 ENCOUNTER — Inpatient Hospital Stay: Payer: BC Managed Care – PPO | Attending: Hematology

## 2022-02-06 ENCOUNTER — Inpatient Hospital Stay (HOSPITAL_BASED_OUTPATIENT_CLINIC_OR_DEPARTMENT_OTHER): Payer: BC Managed Care – PPO | Admitting: Hematology

## 2022-02-06 VITALS — BP 130/70 | HR 97 | Temp 97.9°F | Resp 18 | Wt 216.6 lb

## 2022-02-06 DIAGNOSIS — C8330 Diffuse large B-cell lymphoma, unspecified site: Secondary | ICD-10-CM | POA: Insufficient documentation

## 2022-02-06 DIAGNOSIS — D630 Anemia in neoplastic disease: Secondary | ICD-10-CM | POA: Insufficient documentation

## 2022-02-06 DIAGNOSIS — C8338 Diffuse large B-cell lymphoma, lymph nodes of multiple sites: Secondary | ICD-10-CM | POA: Diagnosis not present

## 2022-02-06 DIAGNOSIS — F418 Other specified anxiety disorders: Secondary | ICD-10-CM | POA: Diagnosis not present

## 2022-02-06 DIAGNOSIS — Z5111 Encounter for antineoplastic chemotherapy: Secondary | ICD-10-CM | POA: Diagnosis not present

## 2022-02-06 DIAGNOSIS — Z9221 Personal history of antineoplastic chemotherapy: Secondary | ICD-10-CM | POA: Diagnosis not present

## 2022-02-06 DIAGNOSIS — D6959 Other secondary thrombocytopenia: Secondary | ICD-10-CM | POA: Diagnosis not present

## 2022-02-06 DIAGNOSIS — Z9011 Acquired absence of right breast and nipple: Secondary | ICD-10-CM | POA: Insufficient documentation

## 2022-02-06 DIAGNOSIS — Z853 Personal history of malignant neoplasm of breast: Secondary | ICD-10-CM | POA: Insufficient documentation

## 2022-02-06 LAB — CMP (CANCER CENTER ONLY)
ALT: 18 U/L (ref 0–44)
AST: 18 U/L (ref 15–41)
Albumin: 4 g/dL (ref 3.5–5.0)
Alkaline Phosphatase: 84 U/L (ref 38–126)
Anion gap: 8 (ref 5–15)
BUN: 12 mg/dL (ref 8–23)
CO2: 29 mmol/L (ref 22–32)
Calcium: 9.9 mg/dL (ref 8.9–10.3)
Chloride: 105 mmol/L (ref 98–111)
Creatinine: 0.74 mg/dL (ref 0.44–1.00)
GFR, Estimated: >60 mL/min (ref >60)
Glucose, Bld: 74 mg/dL (ref 70–99)
Potassium: 3.9 mmol/L (ref 3.5–5.1)
Sodium: 142 mmol/L (ref 135–145)
Total Bilirubin: 0.4 mg/dL (ref 0.3–1.2)
Total Protein: 6.7 g/dL (ref 6.5–8.1)

## 2022-02-06 LAB — CBC WITH DIFFERENTIAL (CANCER CENTER ONLY)
Abs Immature Granulocytes: 0.01 10*3/uL (ref 0.00–0.07)
Basophils Absolute: 0 10*3/uL (ref 0.0–0.1)
Basophils Relative: 1 %
Eosinophils Absolute: 0.2 10*3/uL (ref 0.0–0.5)
Eosinophils Relative: 3 %
HCT: 40.2 % (ref 36.0–46.0)
Hemoglobin: 14.1 g/dL (ref 12.0–15.0)
Immature Granulocytes: 0 %
Lymphocytes Relative: 32 %
Lymphs Abs: 1.9 10*3/uL (ref 0.7–4.0)
MCH: 30.4 pg (ref 26.0–34.0)
MCHC: 35.1 g/dL (ref 30.0–36.0)
MCV: 86.6 fL (ref 80.0–100.0)
Monocytes Absolute: 0.5 10*3/uL (ref 0.1–1.0)
Monocytes Relative: 9 %
Neutro Abs: 3.3 10*3/uL (ref 1.7–7.7)
Neutrophils Relative %: 55 %
Platelet Count: 276 10*3/uL (ref 150–400)
RBC: 4.64 MIL/uL (ref 3.87–5.11)
RDW: 13.4 % (ref 11.5–15.5)
WBC Count: 6 10*3/uL (ref 4.0–10.5)
nRBC: 0 % (ref 0.0–0.2)

## 2022-02-06 LAB — LACTATE DEHYDROGENASE: LDH: 162 U/L (ref 98–192)

## 2022-02-06 NOTE — Progress Notes (Signed)
Marland Kitchen   HEMATOLOGY/ONCOLOGY CLINIC NOTE  Date of Service: 02/06/22   Patient Care Team: Rosine Door as PCP - General (Physician Assistant)  CHIEF COMPLAINTS/PURPOSE OF CONSULTATION:  Follow-up for continued evaluation and management of diffuse large B-cell lymphoma  HISTORY OF PRESENTING ILLNESS:  Please see previous note for details on initial presentation  INTERVAL HISTORY:  Barbara Rhodes is a 67 y.o. female here for continued evaluation of diffuse large B-cell lymphoma.   She was last seen by me on 11/06/2021 was doing well with no new symptoms or concerns.  Patient was last seen by me on 11/06/2021 and she complained of tingling sensation and mild right leg nerve pain, but this is not a new concern. During the last visit, she was interested in removing her port.  Patient reports she has been doing well overall without any new medical concerns since our last visit. She reports that her right leg lymphoedema is back, which she noticed due to not being able to wear her jeans. She notes that her lymphoedema does not reduce at night. She denies redness or pain when walking. Her last PT is this Thursday on 02/08/2022.   She complains of indigestion issues which has ben causing her not to eat as much as she used to and she complains of worsening arthritis on her legs and hands.   Patient denies fever, chills, night sweats, unexpected weight loss, new infection issues, new lumps/bumps, chest pain, abdominal pain, or leg swelling.  Patient has not received influenza vaccine, COVID-19 Booster, and RSV vaccine. She is not inclined to any vaccinations.    MEDICAL HISTORY:  Past Medical History:  Diagnosis Date   Arthritis    Breast cancer ([place]) 2000   Stage 2B, ER+/PR-/Her2-   Colon polyps    Family history of adverse reaction to anesthesia 7   Brother passed away at 57 years old from malignant hyperthermia.   Family history of breast cancer    Family  history of prostate cancer    Hypertension     SURGICAL HISTORY: Past Surgical History:  Procedure Laterality Date   ABDOMINAL HYSTERECTOMY     BACK SURGERY     BREAST SURGERY     COLONOSCOPY WITH PROPOFOL     pt said propofol burned her throat when pushed   IR US GUIDE BX ASP/DRAIN  04/03/2021   LYMPH NODE BIOPSY Left 06/02/2021   Procedure: EXCISIONAL CERVICAL LYMPH NODE BIOPSY;  Surgeon: Jovita Kussmaul, MD;  Location: [facility];  Service: General;  Laterality: Left;   MASTECTOMY Right 2000   MASTECTOMY Right 2000   PORT-A-CATH REMOVAL N/A 12/08/2021   Procedure: REMOVAL PORT-A-CATH;  Surgeon: Jovita Kussmaul, MD;  Location: [place];  Service: General;  Laterality: N/A;   PORTACATH PLACEMENT Right 06/02/2021   Procedure: INSERTION PORT-A-CATH;  Surgeon: Jovita Kussmaul, MD;  Location: [facility];  Service: General;  Laterality: Right;   POSTERIOR CERVICAL LAMINECTOMY Left 01/23/2018   Procedure: LEFT CERVICAL SEVEN- [place], MICRODISCECTOMY;  Surgeon: Jovita Gamma, MD;  Location: [place];  Service: Neurosurgery;  Laterality: Left;   ROTATOR CUFF REPAIR     TONSILLECTOMY     TRANSFORAMINAL LUMBAR INTERBODY FUSION (TLIF) WITH PEDICLE SCREW FIXATION 2 LEVEL Right 10/30/2019   Procedure: Right Lumbar 3-4 Lumbar 4-5 Transforaminal lumbar interbody fusion;  Surgeon: Erline Levine, MD;  Location: [facility];  Service: Neurosurgery;  Laterality: Right;  3C/RM 21   TUBAL LIGATION     WISDOM TOOTH  EXTRACTION      SOCIAL HISTORY: Social History   Socioeconomic History   Marital status: Married    Spouse name: Not on file   Number of children: Not on file   Years of education: Not on file   Highest education level: Not on file  Occupational History   Not on file  Tobacco Use   Smoking status: Never   Smokeless tobacco: Never  Vaping Use   Vaping Use: Never used  Substance and Sexual Activity   Alcohol use: No   Drug use: No   Sexual activity: Not on file  Other  Topics Concern   Not on file  Social History Narrative   Not on file   Social Determinants of Health   Financial Resource Strain: Not on file  Food Insecurity: Not on file  Transportation Needs: Not on file  Physical Activity: Not on file  Stress: Not on file  Social Connections: Not on file  Intimate Partner Violence: Not on file    FAMILY HISTORY: Family History  Problem Relation Age of Onset   Breast cancer Paternal Grandmother 62   Prostate cancer Father        dx in his 22s   Colon polyps Father 91   Dementia Mother    Hypertension Mother    Dementia Maternal Aunt    Stroke Maternal Grandfather    Rheum arthritis Paternal Grandfather    Breast cancer Other        MGM's sister, post-menopausal breast cancer   Breast cancer Cousin 37       paternal 2nd cousin    ALLERGIES:  is allergic to penicillins and rituximab-pvvr.  MEDICATIONS:  Current Outpatient Medications  Medication Sig Dispense Refill   acetaminophen (TYLENOL) 500 MG tablet Take 1,000 mg by mouth daily as needed for mild pain or moderate pain.     B Complex-C (B-COMPLEX WITH VITAMIN C) tablet Take 1 tablet by mouth daily.     calcium carbonate (OSCAL) 1500 (600 Ca) MG TABS tablet Take 600 mg of elemental calcium by mouth daily.     celecoxib (CELEBREX) 100 MG capsule Take 100 mg by mouth daily.     Cholecalciferol (VITAMIN D3) 50 MCG (2000 UT) capsule Take 4,000 Units by mouth daily.     docusate sodium (COLACE) 100 MG capsule Take 100 mg by mouth daily as needed for mild constipation.     Magnesium 250 MG TABS Take 250 mg by mouth daily.     Omega-3 Fatty Acids (FISH OIL ULTRA) 1400 MG CAPS Take 1,400 mg by mouth daily.     Potassium 99 MG TABS Take 99 mg by mouth daily.     triamterene-hydrochlorothiazide (MAXZIDE-25) 37.5-25 MG tablet Take 1 tablet by mouth daily.      vitamin B-12 (CYANOCOBALAMIN) 100 MCG tablet Take 100 mcg by mouth daily.     No current facility-administered medications for this  visit.    REVIEW OF SYSTEMS:   .10 Point review of Systems was done is negative except as noted above.   PHYSICAL EXAMINATION: ECOG PERFORMANCE STATUS: 1 - Symptomatic but completely ambulatory Vitals:   02/06/22 0923  BP: 130/70  Pulse: 97  Resp: 18  Temp: 97.9 F (36.6 C)  SpO2: 99%  . GENERAL:alert, in no acute distress and comfortable SKIN: no acute rashes, no significant lesions EYES: conjunctiva are pink and non-injected, sclera anicteric OROPHARYNX: MMM, no exudates, no oropharyngeal erythema or ulceration NECK: supple, no JVD LYMPH:  no palpable  lymphadenopathy in the cervical, axillary or inguinal regions LUNGS: clear to auscultation b/l with normal respiratory effort HEART: regular rate & rhythm ABDOMEN:  normoactive bowel sounds , non tender, not distended. Extremity: no pedal edema PSYCH: alert & oriented x 3 with fluent speech NEURO: no focal motor/sensory deficits   LABORATORY DATA:  I have reviewed the data as listed .    Latest Ref Rng & Units 02/06/2022    9:00 AM 12/08/2021    7:58 AM 11/06/2021    9:28 AM  CBC  WBC 4.0 - 10.5 K/uL 6.0  4.4  5.3   Hemoglobin 12.0 - 15.0 g/dL 14.1  12.9  12.5   Hematocrit 36.0 - 46.0 % 40.2  38.2  35.6   Platelets 150 - 400 K/uL 276  240  229    .    Latest Ref Rng & Units 02/06/2022    9:00 AM 12/08/2021    7:58 AM 11/06/2021    9:28 AM  CMP  Glucose 70 - 99 mg/dL 74  111  117   BUN 8 - 23 mg/dL '12  15  16   '$ Creatinine 0.44 - 1.00 mg/dL 0.74  0.81  0.74   Sodium 135 - 145 mmol/L 142  139  141   Potassium 3.5 - 5.1 mmol/L 3.9  3.4  3.8   Chloride 98 - 111 mmol/L 105  103  106   CO2 22 - 32 mmol/L '29  25  29   '$ Calcium 8.9 - 10.3 mg/dL 9.9  9.3  9.2   Total Protein 6.5 - 8.1 g/dL 6.7   6.4   Total Bilirubin 0.3 - 1.2 mg/dL 0.4   0.5   Alkaline Phos 38 - 126 U/L 84   82   AST 15 - 41 U/L 18   18   ALT 0 - 44 U/L 18   18    . Lab Results  Component Value Date   LDH 162 02/06/2022    SURGICAL  PATHOLOGY  CASE: [id number]  PATIENT: [facility]  Surgical Pathology Report   Clinical History: remote history of breast cancer, now with  indeterminate left supraclavicular lymphadenopathy (cm)   FINAL MICROSCOPIC DIAGNOSIS:   A. LYMPH NODE, LEFT SUPRACLAVICULAR, NEEDLE CORE BIOPSY:  -Atypical lymphoid proliferation  -See comment   COMMENT:   The sections show several very small needle core biopsy fragments of  apparently lymph nodal tissue displaying an atypical lymphoid  proliferation of primarily large lymphoid appearing cells characterized  by vesicular chromatin and prominent nucleoli associated with scattered  mitosis.  The appearance is apparently diffuse with lack of atypical  follicles.  Flow cytometric analysis was performed [facility] [id number]) and  shows predominance of T lymphocytes with nonspecific changes.  No  monoclonal B-cell population identified.  A battery of  immunohistochemical stains was performed but the tissue is prominently  exhausted on deeper sectioning.  Scattered residual very minute  fragments show that the lymphoid appearing cells are positive for LCA,  CD20, CD79a, BCL6, MUM1, and Bcl-2.  No significant staining is seen  with CD138, CD10, CD34, cyclin D1, S100, cytokeratin AE1/AE3,  cytokeratin 8/18, or GATA3.  There is an admixed T-cell population to a  lesser extent as seen with CD3 and CD5 and there is no apparent  co-expression of CD5 in B-cell areas.  The following stains were  performed but there is no optimal tissue present on deeper sectioning  for evaluation including CD30, TdT, and in situ hybridization for kappa,  lambda and EBV.  The overall findings are limited but very concerning  for involvement by a B-cell lymphoproliferative process particularly  large cell lymphoma.  Excisional biopsy is recommended to further  evaluate this process.   SURGICAL PATHOLOGY  CASE: [id number]  PATIENT: [facility]  Surgical Pathology  Report      Clinical History: Cervical lymphocyte; history of Hodgkin's lymphoma  ([facility])    FINAL MICROSCOPIC DIAGNOSIS:   A. LYMPH NODE, LEFT NECK, EXCISION:   -  Diffuse large B-cell lymphoma, activated B-cell subtype  -  See comment   COMMENT:   The excisional biopsy consists of an enlarged lymph node with complete  effacement by a large pleomorphic population of lymphoid cells with  vesicular chromatin and prominent nucleoli.  By immunohistochemistry,  the atypical lymphocytes are positive for CD20, PAX5, Bcl-2, BCL6 and  Mum-1.  The cells are negative for CD5, CD10, CD30 and EBV by in situ  hybridization.  The proliferative rate by Ki-67 is 60-70%.  CD3  highlights background benign-appearing lymphocytes.  Overall, the  findings are consistent with a diffuse large B cell lymphoma, activated  B-cell phenotype.  Preliminary results of this case were discussed with  Dr. Jenell Milliner on Jun 06, 2021.  FISH studies (high-grade/large B-cell  lymphoma) are pending and will be reported in an addendum.   RADIOGRAPHIC STUDIES: I have personally reviewed the radiological images as listed and agreed with the findings in the report. No results found.  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, sports with previous history of right-sided breast cancer status post right mastectomy and adjuvant chemotherapy including doxorubicin now with:  #1 stage III/IV diffuse large B-cell lymphoma.-Activated B-cell subtype High risk lymphoma FISH panel no evidence of double or triple hit lymphoma #2 anemia due to lymphoma involvement of bone marrow #3 status post Port-A-Cath placement #4 thrombocytopenia secondary to #1 #5 high risk of tumor lysis syndrome #6 anxiety #7 remote history of right breast cancer status postmastectomy and adjuvant chemotherapy #8 rigors with Rituxan dose being At 200 mg/h  Plan: discussed lab results from today, 02/06/2022, with the patient. CBC is stable. CMPstabler LDH  wnl -patient has no lab or clinical evidence of recurrence/progression of DLBCL at this time. -Recommended influenza vaccine, COVID-19 Booster, and RSV vaccine.  -Answered all of patient's questions. -Will follow-up in 3 months.   Follow-up: RTC with Dr Irene Limbo with labs in 3 months. The total time spent in the appointment was 20 minutes* .  All of the patient's questions were answered with apparent satisfaction. The patient knows to call the clinic with any problems, questions or concerns.   Sullivan Lone MD MS AAHIVMS [facility] Hematology/Oncology Physician [facility]  .*Total Encounter Time as defined by the Centers for Medicare and Medicaid Services includes, in addition to the face-to-face time of a patient visit (documented in the note above) non-face-to-face time: obtaining and reviewing outside history, ordering and reviewing medications, tests or procedures, care coordination (communications with other health care professionals or caregivers) and documentation in the medical record.   I, Cleda Mccreedy, am acting as a Education administrator for Sullivan Lone, MD. .I have reviewed the above documentation for accuracy and completeness, and I agree with the above. Brunetta Genera MD'

## 2022-02-08 DIAGNOSIS — M21371 Foot drop, right foot: Secondary | ICD-10-CM | POA: Diagnosis not present

## 2022-02-12 ENCOUNTER — Encounter: Payer: Self-pay | Admitting: Hematology

## 2022-03-20 DIAGNOSIS — H353112 Nonexudative age-related macular degeneration, right eye, intermediate dry stage: Secondary | ICD-10-CM | POA: Diagnosis not present

## 2022-04-02 DIAGNOSIS — L723 Sebaceous cyst: Secondary | ICD-10-CM | POA: Diagnosis not present

## 2022-04-02 DIAGNOSIS — N898 Other specified noninflammatory disorders of vagina: Secondary | ICD-10-CM | POA: Diagnosis not present

## 2022-04-02 DIAGNOSIS — N390 Urinary tract infection, site not specified: Secondary | ICD-10-CM | POA: Diagnosis not present

## 2022-04-05 DIAGNOSIS — L720 Epidermal cyst: Secondary | ICD-10-CM | POA: Diagnosis not present

## 2022-04-10 DIAGNOSIS — M4802 Spinal stenosis, cervical region: Secondary | ICD-10-CM | POA: Diagnosis not present

## 2022-04-10 DIAGNOSIS — I10 Essential (primary) hypertension: Secondary | ICD-10-CM | POA: Diagnosis not present

## 2022-04-10 DIAGNOSIS — C8510 Unspecified B-cell lymphoma, unspecified site: Secondary | ICD-10-CM | POA: Diagnosis not present

## 2022-04-10 DIAGNOSIS — M4316 Spondylolisthesis, lumbar region: Secondary | ICD-10-CM | POA: Diagnosis not present

## 2022-04-23 DIAGNOSIS — M21371 Foot drop, right foot: Secondary | ICD-10-CM | POA: Diagnosis not present

## 2022-04-23 DIAGNOSIS — M48061 Spinal stenosis, lumbar region without neurogenic claudication: Secondary | ICD-10-CM | POA: Diagnosis not present

## 2022-04-23 DIAGNOSIS — Z6836 Body mass index (BMI) 36.0-36.9, adult: Secondary | ICD-10-CM | POA: Diagnosis not present

## 2022-05-07 NOTE — Progress Notes (Signed)
HEMATOLOGY/ONCOLOGY CLINIC NOTE  Date of Service: 05/08/22   Patient Care Team: Sheela Stack as PCP - General (Physician Assistant)  CHIEF COMPLAINTS/PURPOSE OF CONSULTATION:  Follow-up for continued evaluation and management of diffuse large B-cell lymphoma  HISTORY OF PRESENTING ILLNESS:  Please see previous note for details on initial presentation  INTERVAL HISTORY:  Barbara Rhodes is a 67 y.o. female here for continued evaluation of diffuse large B-cell lymphoma.   She was last seen by me on 02/06/22 and was doing well without any new medical concerns.  Today, she states that she has been doing well overall from an oncologic perspective.  She has been seeing her neurosurgeon for follow up leg jerking symptoms at nighttime which was keeping her awake. She took has been taking Baclofen for the lats x2 weeks with complete improvement of this problem.  She states that her back pain limits her during the day with certain chores such as vacuuming. She is swimming and walking but has difficulty walking up steps, due to stability issues, without her ergonomic cane.  She is eating well. She denies any recent illnesses or infections. She denies any fevers, chills, or lumps/bumps.  She states that she is UTD on her age-related screenings- she has decided not to do any further screening colonoscopies.   We discussed her labs in details today.    MEDICAL HISTORY:  Past Medical History:  Diagnosis Date   Arthritis    Breast cancer (HCC) 2000   Stage 2B, ER+/PR-/Her2-   Colon polyps    Family history of adverse reaction to anesthesia 80   Brother passed away at 50 years old from malignant hyperthermia.   Family history of breast cancer    Family history of prostate cancer    Hypertension     SURGICAL HISTORY: Past Surgical History:  Procedure Laterality Date   ABDOMINAL HYSTERECTOMY     BACK SURGERY     BREAST SURGERY     COLONOSCOPY WITH  PROPOFOL     pt said propofol burned her throat when pushed   IR US GUIDE BX ASP/DRAIN  04/03/2021   LYMPH NODE BIOPSY Left 06/02/2021   Procedure: EXCISIONAL CERVICAL LYMPH NODE BIOPSY;  Surgeon: Griselda Miner, MD;  Location: [facility];  Service: General;  Laterality: Left;   MASTECTOMY Right 2000   MASTECTOMY Right 2000   PORT-A-CATH REMOVAL N/A 12/08/2021   Procedure: REMOVAL PORT-A-CATH;  Surgeon: Griselda Miner, MD;  Location: [facility] OR;  Service: General;  Laterality: N/A;   PORTACATH PLACEMENT Right 06/02/2021   Procedure: INSERTION PORT-A-CATH;  Surgeon: Griselda Miner, MD;  Location: [facility];  Service: General;  Laterality: Right;   POSTERIOR CERVICAL LAMINECTOMY Left 01/23/2018   Procedure: LEFT CERVICAL SEVEN- THORACIC ONE LAMINOTOMY,FORAMINOTOMY, MICRODISCECTOMY;  Surgeon: Shirlean Kelly, MD;  Location: [facility];  Service: Neurosurgery;  Laterality: Left;   ROTATOR CUFF REPAIR     TONSILLECTOMY     TRANSFORAMINAL LUMBAR INTERBODY FUSION (TLIF) WITH PEDICLE SCREW FIXATION 2 LEVEL Right 10/30/2019   Procedure: Right Lumbar 3-4 Lumbar 4-5 Transforaminal lumbar interbody fusion;  Surgeon: Maeola Harman, MD;  Location: [facility] OR;  Service: Neurosurgery;  Laterality: Right;  3C/RM 21   TUBAL LIGATION     WISDOM TOOTH EXTRACTION      SOCIAL HISTORY: Social History   Socioeconomic History   Marital status: Married    Spouse name: Not on file   Number of children: Not on file   Years of education:  Not on file   Highest education level: Not on file  Occupational History   Not on file  Tobacco Use   Smoking status: Never   Smokeless tobacco: Never  Vaping Use   Vaping Use: Never used  Substance and Sexual Activity   Alcohol use: No   Drug use: No   Sexual activity: Not on file  Other Topics Concern   Not on file  Social History Narrative   Not on file   Social Determinants of Health   Financial Resource Strain: Not on file  Food Insecurity: Not on file  Transportation Needs: Not on  file  Physical Activity: Not on file  Stress: Not on file  Social Connections: Not on file  Intimate Partner Violence: Not on file    FAMILY HISTORY: Family History  Problem Relation Age of Onset   Breast cancer Paternal Grandmother 13   Prostate cancer Father        dx in his 98s   Colon polyps Father 57   Dementia Mother    Hypertension Mother    Dementia Maternal Aunt    Stroke Maternal Grandfather    Rheum arthritis Paternal Grandfather    Breast cancer Other        MGM's sister, post-menopausal breast cancer   Breast cancer Cousin 73       paternal 2nd cousin    ALLERGIES:  is allergic to penicillins and rituximab-pvvr.  MEDICATIONS:  Current Outpatient Medications  Medication Sig Dispense Refill   acetaminophen (TYLENOL) 500 MG tablet Take 1,000 mg by mouth daily as needed for mild pain or moderate pain.     B Complex-C (B-COMPLEX WITH VITAMIN C) tablet Take 1 tablet by mouth daily.     calcium carbonate (OSCAL) 1500 (600 Ca) MG TABS tablet Take 600 mg of elemental calcium by mouth daily.     calcium citrate (CALCITRATE - DOSED IN MG ELEMENTAL CALCIUM) 950 (200 Ca) MG tablet Take 200 mg of elemental calcium by mouth daily.     celecoxib (CELEBREX) 100 MG capsule Take 100 mg by mouth daily.     Cholecalciferol (VITAMIN D3) 50 MCG (2000 UT) capsule Take 4,000 Units by mouth daily.     docusate sodium (COLACE) 100 MG capsule Take 100 mg by mouth daily as needed for mild constipation.     Magnesium 400 MG TABS Take 400 mg by mouth daily.     Omega-3 Fatty Acids (FISH OIL ULTRA) 1400 MG CAPS Take 1,400 mg by mouth daily.     Potassium 99 MG TABS Take 99 mg by mouth daily.     triamterene-hydrochlorothiazide (MAXZIDE-25) 37.5-25 MG tablet Take 1 tablet by mouth daily.      vitamin B-12 (CYANOCOBALAMIN) 100 MCG tablet Take 100 mcg by mouth daily.     No current facility-administered medications for this visit.    REVIEW OF SYSTEMS:   10 Point review of Systems was done  is negative except as noted above.   PHYSICAL EXAMINATION: ECOG PERFORMANCE STATUS: 1 - Symptomatic but completely ambulatory Vitals:   05/08/22 0859  BP: 139/76  Pulse: 68  Resp: 18  Temp: 97.8 F (36.6 C)  SpO2: 99%   GENERAL:alert, in no acute distress and comfortable SKIN: no acute rashes, no significant lesions EYES: conjunctiva are pink and non-injected, sclera anicteric OROPHARYNX: MMM, no exudates, no oropharyngeal erythema or ulceration NECK: supple, no JVD LYMPH:  no palpable lymphadenopathy in the cervical, axillary or inguinal regions LUNGS: clear to auscultation b/l  with normal respiratory effort HEART: regular rate & rhythm ABDOMEN:  normoactive bowel sounds , non tender, not distended. Extremity: no pedal edema PSYCH: alert & oriented x 3 with fluent speech NEURO: no focal motor/sensory deficits   LABORATORY DATA:  I have reviewed the data as listed    Latest Ref Rng & Units 05/08/2022    8:43 AM 02/06/2022    9:00 AM 12/08/2021    7:58 AM  CBC  WBC 4.0 - 10.5 K/uL 4.6  6.0  4.4   Hemoglobin 12.0 - 15.0 g/dL 69.6  29.5  28.4   Hematocrit 36.0 - 46.0 % 39.3  40.2  38.2   Platelets 150 - 400 K/uL 243  276  240       Latest Ref Rng & Units 02/06/2022    9:00 AM 12/08/2021    7:58 AM 11/06/2021    9:28 AM  CMP  Glucose 70 - 99 mg/dL 74  132  440   BUN 8 - 23 mg/dL Creatinine 0.44 - 1.00 mg/dL 1.02  7.25  3.66   Sodium 135 - 145 mmol/L 142  139  141   Potassium 3.5 - 5.1 mmol/L 3.9  3.4  3.8   Chloride 98 - 111 mmol/L 105  103  106   CO2 22 - 32 mmol/L Calcium 8.9 - 10.3 mg/dL 9.9  9.3  9.2   Total Protein 6.5 - 8.1 g/dL 6.7   6.4   Total Bilirubin 0.3 - 1.2 mg/dL 0.4   0.5   Alkaline Phos 38 - 126 U/L 84   82   AST 15 - 41 U/L 18   18   ALT 0 - 44 U/L 18   18    Lab Results  Component Value Date   LDH 162 02/06/2022    SURGICAL PATHOLOGY 04/03/2021 FINAL MICROSCOPIC DIAGNOSIS:  A. LYMPH NODE, LEFT SUPRACLAVICULAR,  NEEDLE CORE BIOPSY:  -Atypical lymphoid proliferation  -See comment   COMMENT:  The sections show several very small needle core biopsy fragments of  apparently lymph nodal tissue displaying an atypical lymphoid  proliferation of primarily large lymphoid appearing cells characterized  by vesicular chromatin and prominent nucleoli associated with scattered  mitosis.  The appearance is apparently diffuse with lack of atypical  follicles.  Flow cytometric analysis was performed [facility] [id number]) and  shows predominance of T lymphocytes with nonspecific changes.  No  monoclonal B-cell population identified.  A battery of  immunohistochemical stains was performed but the tissue is prominently  exhausted on deeper sectioning.  Scattered residual very minute  fragments show that the lymphoid appearing cells are positive for LCA,  CD20, CD79a, BCL6, MUM1, and Bcl-2.  No significant staining is seen  with CD138, CD10, CD34, cyclin D1, S100, cytokeratin AE1/AE3,  cytokeratin 8/18, or GATA3.  There is an admixed T-cell population to a  lesser extent as seen with CD3 and CD5 and there is no apparent  co-expression of CD5 in B-cell areas.  The following stains were  performed but there is no optimal tissue present on deeper sectioning  for evaluation including CD30, TdT, and in situ hybridization for kappa,  lambda and EBV.  The overall findings are limited but very concerning  for involvement by a B-cell lymphoproliferative process particularly  large cell lymphoma.  Excisional biopsy is recommended to further  evaluate this process.   SURGICAL PATHOLOGY 06/02/2021 FINAL MICROSCOPIC DIAGNOSIS:  A.  LYMPH NODE, LEFT NECK, EXCISION:  -  Diffuse large B-cell lymphoma, activated B-cell subtype  -  See comment   COMMENT:  The excisional biopsy consists of an enlarged lymph node with complete  effacement by a large pleomorphic population of lymphoid cells with  vesicular chromatin and prominent  nucleoli.  By immunohistochemistry,  the atypical lymphocytes are positive for CD20, PAX5, Bcl-2, BCL6 and  Mum-1.  The cells are negative for CD5, CD10, CD30 and EBV by in situ  hybridization.  The proliferative rate by Ki-67 is 60-70%.  CD3  highlights background benign-appearing lymphocytes.  Overall, the  findings are consistent with a diffuse large B cell lymphoma, activated  B-cell phenotype.  Preliminary results of this case were discussed with  Dr. Lance Bosch on Jun 06, 2021.  FISH studies (high-grade/large B-cell  lymphoma) are pending and will be reported in an addendum.     RADIOGRAPHIC STUDIES: I have personally reviewed the radiological images as listed and agreed with the findings in the report. No results found.   ASSESSMENT & PLAN:  67 y.o. retired [employer] fund[employer] with previous history of right-sided breast cancer status post right mastectomy and adjuvant chemotherapy including doxorubicin now with:  #1 stage III/IV diffuse large B-cell lymphoma.-Activated B-cell subtype High risk lymphoma FISH panel no evidence of double or triple hit lymphoma #2 anemia due to lymphoma involvement of bone marrow #3 status post Port-A-Cath placement #4 thrombocytopenia secondary to #1 #5 high risk of tumor lysis syndrome #6 anxiety #7 remote history of right breast cancer status postmastectomy and adjuvant chemotherapy #8 rigors with Rituxan dose being At 200 mg/h  Plan: - Discussed lab results from today, 05/08/22, with the patient. CBC is stable. CMP is stable. LDH is WNL at 156. - Patient has no lab or clinical evidence of recurrence/progression of DLBCL at this time. -Answered all of patient's questions. -Will follow-up in 3 months.   Follow-up: RTC with Dr Candise Che with labs in 3 months.   The total time spent in the appointment was 22 minutes* .  All of the patient's questions were answered with apparent satisfaction. The patient knows to call the clinic with any problems, questions or  concerns.   Wyvonnia Lora MD MS AAHIVMS [facility] Hematology/Oncology Physician [facility]  *Total Encounter Time as defined by the Centers for Medicare and Medicaid Services includes, in addition to the face-to-face time of a patient visit (documented in the note above) non-face-to-face time: obtaining and reviewing outside history, ordering and reviewing medications, tests or procedures, care coordination (communications with other health care professionals or caregivers) and documentation in the medical record.   I,Alexis Herring,acting as a Neurosurgeon for Wyvonnia Lora, MD.,have documented all relevant documentation on the behalf of Wyvonnia Lora, MD,as directed by  Wyvonnia Lora, MD while in the presence of Wyvonnia Lora, MD.  .I have reviewed the above documentation for accuracy and completeness, and I agree with the above.  Johney Maine MD

## 2022-05-08 ENCOUNTER — Inpatient Hospital Stay (HOSPITAL_BASED_OUTPATIENT_CLINIC_OR_DEPARTMENT_OTHER): Payer: BC Managed Care – PPO | Admitting: Hematology

## 2022-05-08 ENCOUNTER — Inpatient Hospital Stay: Payer: BC Managed Care – PPO | Attending: Hematology

## 2022-05-08 VITALS — BP 139/76 | HR 68 | Temp 97.8°F | Resp 18 | Ht 63.5 in | Wt 213.4 lb

## 2022-05-08 DIAGNOSIS — Z803 Family history of malignant neoplasm of breast: Secondary | ICD-10-CM | POA: Insufficient documentation

## 2022-05-08 DIAGNOSIS — F419 Anxiety disorder, unspecified: Secondary | ICD-10-CM | POA: Diagnosis not present

## 2022-05-08 DIAGNOSIS — Z853 Personal history of malignant neoplasm of breast: Secondary | ICD-10-CM | POA: Diagnosis not present

## 2022-05-08 DIAGNOSIS — D630 Anemia in neoplastic disease: Secondary | ICD-10-CM | POA: Diagnosis not present

## 2022-05-08 DIAGNOSIS — C8338 Diffuse large B-cell lymphoma, lymph nodes of multiple sites: Secondary | ICD-10-CM

## 2022-05-08 DIAGNOSIS — Z8042 Family history of malignant neoplasm of prostate: Secondary | ICD-10-CM | POA: Insufficient documentation

## 2022-05-08 DIAGNOSIS — D6959 Other secondary thrombocytopenia: Secondary | ICD-10-CM | POA: Insufficient documentation

## 2022-05-08 DIAGNOSIS — C8330 Diffuse large B-cell lymphoma, unspecified site: Secondary | ICD-10-CM | POA: Insufficient documentation

## 2022-05-08 DIAGNOSIS — Z9221 Personal history of antineoplastic chemotherapy: Secondary | ICD-10-CM | POA: Diagnosis not present

## 2022-05-08 LAB — CMP (CANCER CENTER ONLY)
ALT: 18 U/L (ref 0–44)
AST: 18 U/L (ref 15–41)
Albumin: 4.3 g/dL (ref 3.5–5.0)
Alkaline Phosphatase: 79 U/L (ref 38–126)
Anion gap: 8 (ref 5–15)
BUN: 19 mg/dL (ref 8–23)
CO2: 30 mmol/L (ref 22–32)
Calcium: 9.7 mg/dL (ref 8.9–10.3)
Chloride: 103 mmol/L (ref 98–111)
Creatinine: 0.81 mg/dL (ref 0.44–1.00)
GFR, Estimated: >60 mL/min (ref >60)
Glucose, Bld: 100 mg/dL — ABNORMAL HIGH (ref 70–99)
Potassium: 3.5 mmol/L (ref 3.5–5.1)
Sodium: 141 mmol/L (ref 135–145)
Total Bilirubin: 0.7 mg/dL (ref 0.3–1.2)
Total Protein: 7 g/dL (ref 6.5–8.1)

## 2022-05-08 LAB — CBC WITH DIFFERENTIAL (CANCER CENTER ONLY)
Abs Immature Granulocytes: 0.01 10*3/uL (ref 0.00–0.07)
Basophils Absolute: 0 10*3/uL (ref 0.0–0.1)
Basophils Relative: 0 %
Eosinophils Absolute: 0.1 10*3/uL (ref 0.0–0.5)
Eosinophils Relative: 3 %
HCT: 39.3 % (ref 36.0–46.0)
Hemoglobin: 13.9 g/dL (ref 12.0–15.0)
Immature Granulocytes: 0 %
Lymphocytes Relative: 34 %
Lymphs Abs: 1.5 10*3/uL (ref 0.7–4.0)
MCH: 30.8 pg (ref 26.0–34.0)
MCHC: 35.4 g/dL (ref 30.0–36.0)
MCV: 87.1 fL (ref 80.0–100.0)
Monocytes Absolute: 0.4 10*3/uL (ref 0.1–1.0)
Monocytes Relative: 9 %
Neutro Abs: 2.4 10*3/uL (ref 1.7–7.7)
Neutrophils Relative %: 54 %
Platelet Count: 243 10*3/uL (ref 150–400)
RBC: 4.51 MIL/uL (ref 3.87–5.11)
RDW: 13.4 % (ref 11.5–15.5)
WBC Count: 4.6 10*3/uL (ref 4.0–10.5)
nRBC: 0 % (ref 0.0–0.2)

## 2022-05-08 LAB — LACTATE DEHYDROGENASE: LDH: 156 U/L (ref 98–192)

## 2022-05-09 ENCOUNTER — Telehealth: Payer: Self-pay | Admitting: Hematology

## 2022-05-10 DIAGNOSIS — L720 Epidermal cyst: Secondary | ICD-10-CM | POA: Diagnosis not present

## 2022-05-14 ENCOUNTER — Encounter: Payer: Self-pay | Admitting: Hematology

## 2022-05-25 DIAGNOSIS — Z6835 Body mass index (BMI) 35.0-35.9, adult: Secondary | ICD-10-CM | POA: Diagnosis not present

## 2022-05-25 DIAGNOSIS — M79645 Pain in left finger(s): Secondary | ICD-10-CM | POA: Diagnosis not present

## 2022-06-20 DIAGNOSIS — C50911 Malignant neoplasm of unspecified site of right female breast: Secondary | ICD-10-CM | POA: Diagnosis not present

## 2022-06-26 DIAGNOSIS — Z6836 Body mass index (BMI) 36.0-36.9, adult: Secondary | ICD-10-CM | POA: Diagnosis not present

## 2022-06-26 DIAGNOSIS — Z124 Encounter for screening for malignant neoplasm of cervix: Secondary | ICD-10-CM | POA: Diagnosis not present

## 2022-06-26 DIAGNOSIS — Z01419 Encounter for gynecological examination (general) (routine) without abnormal findings: Secondary | ICD-10-CM | POA: Diagnosis not present

## 2022-07-17 DIAGNOSIS — H353121 Nonexudative age-related macular degeneration, left eye, early dry stage: Secondary | ICD-10-CM | POA: Diagnosis not present

## 2022-07-17 DIAGNOSIS — K5900 Constipation, unspecified: Secondary | ICD-10-CM | POA: Diagnosis not present

## 2022-07-17 DIAGNOSIS — N393 Stress incontinence (female) (male): Secondary | ICD-10-CM | POA: Diagnosis not present

## 2022-07-17 DIAGNOSIS — M6281 Muscle weakness (generalized): Secondary | ICD-10-CM | POA: Diagnosis not present

## 2022-07-23 ENCOUNTER — Telehealth: Payer: Self-pay | Admitting: Hematology

## 2022-07-23 NOTE — Telephone Encounter (Signed)
Patient is aware of upcoming appointment times/dates.  

## 2022-07-24 DIAGNOSIS — N393 Stress incontinence (female) (male): Secondary | ICD-10-CM | POA: Diagnosis not present

## 2022-07-24 DIAGNOSIS — K5900 Constipation, unspecified: Secondary | ICD-10-CM | POA: Diagnosis not present

## 2022-07-24 DIAGNOSIS — M6281 Muscle weakness (generalized): Secondary | ICD-10-CM | POA: Diagnosis not present

## 2022-08-01 DIAGNOSIS — M6281 Muscle weakness (generalized): Secondary | ICD-10-CM | POA: Diagnosis not present

## 2022-08-01 DIAGNOSIS — N393 Stress incontinence (female) (male): Secondary | ICD-10-CM | POA: Diagnosis not present

## 2022-08-01 DIAGNOSIS — K5900 Constipation, unspecified: Secondary | ICD-10-CM | POA: Diagnosis not present

## 2022-08-07 ENCOUNTER — Other Ambulatory Visit: Payer: BC Managed Care – PPO

## 2022-08-07 ENCOUNTER — Ambulatory Visit: Payer: BC Managed Care – PPO | Admitting: Hematology

## 2022-08-07 DIAGNOSIS — N393 Stress incontinence (female) (male): Secondary | ICD-10-CM | POA: Diagnosis not present

## 2022-08-07 DIAGNOSIS — K5900 Constipation, unspecified: Secondary | ICD-10-CM | POA: Diagnosis not present

## 2022-08-07 DIAGNOSIS — M6281 Muscle weakness (generalized): Secondary | ICD-10-CM | POA: Diagnosis not present

## 2022-08-13 ENCOUNTER — Other Ambulatory Visit: Payer: Self-pay | Admitting: *Deleted

## 2022-08-13 DIAGNOSIS — C8338 Diffuse large B-cell lymphoma, lymph nodes of multiple sites: Secondary | ICD-10-CM

## 2022-08-14 ENCOUNTER — Ambulatory Visit: Payer: BC Managed Care – PPO | Admitting: Hematology

## 2022-08-14 ENCOUNTER — Inpatient Hospital Stay: Payer: BC Managed Care – PPO | Attending: Hematology

## 2022-08-14 ENCOUNTER — Other Ambulatory Visit: Payer: BC Managed Care – PPO

## 2022-08-14 ENCOUNTER — Other Ambulatory Visit: Payer: Self-pay

## 2022-08-14 DIAGNOSIS — C8330 Diffuse large B-cell lymphoma, unspecified site: Secondary | ICD-10-CM | POA: Insufficient documentation

## 2022-08-14 DIAGNOSIS — C8338 Diffuse large B-cell lymphoma, lymph nodes of multiple sites: Secondary | ICD-10-CM

## 2022-08-14 DIAGNOSIS — N393 Stress incontinence (female) (male): Secondary | ICD-10-CM | POA: Diagnosis not present

## 2022-08-14 DIAGNOSIS — M6281 Muscle weakness (generalized): Secondary | ICD-10-CM | POA: Diagnosis not present

## 2022-08-14 DIAGNOSIS — K5900 Constipation, unspecified: Secondary | ICD-10-CM | POA: Diagnosis not present

## 2022-08-14 LAB — CMP (CANCER CENTER ONLY)
ALT: 15 U/L (ref 0–44)
AST: 17 U/L (ref 15–41)
Albumin: 4.1 g/dL (ref 3.5–5.0)
Alkaline Phosphatase: 78 U/L (ref 38–126)
Anion gap: 4 — ABNORMAL LOW (ref 5–15)
BUN: 12 mg/dL (ref 8–23)
CO2: 32 mmol/L (ref 22–32)
Calcium: 9.5 mg/dL (ref 8.9–10.3)
Chloride: 107 mmol/L (ref 98–111)
Creatinine: 0.77 mg/dL (ref 0.44–1.00)
GFR, Estimated: >60 mL/min (ref >60)
Glucose, Bld: 99 mg/dL (ref 70–99)
Potassium: 4.4 mmol/L (ref 3.5–5.1)
Sodium: 143 mmol/L (ref 135–145)
Total Bilirubin: 0.6 mg/dL (ref 0.3–1.2)
Total Protein: 6.3 g/dL — ABNORMAL LOW (ref 6.5–8.1)

## 2022-08-14 LAB — CBC WITH DIFFERENTIAL (CANCER CENTER ONLY)
Abs Immature Granulocytes: 0.02 10*3/uL (ref 0.00–0.07)
Basophils Absolute: 0 10*3/uL (ref 0.0–0.1)
Basophils Relative: 1 %
Eosinophils Absolute: 0.2 10*3/uL (ref 0.0–0.5)
Eosinophils Relative: 4 %
HCT: 39.5 % (ref 36.0–46.0)
Hemoglobin: 13.5 g/dL (ref 12.0–15.0)
Immature Granulocytes: 1 %
Lymphocytes Relative: 34 %
Lymphs Abs: 1.5 10*3/uL (ref 0.7–4.0)
MCH: 31.3 pg (ref 26.0–34.0)
MCHC: 34.2 g/dL (ref 30.0–36.0)
MCV: 91.4 fL (ref 80.0–100.0)
Monocytes Absolute: 0.4 10*3/uL (ref 0.1–1.0)
Monocytes Relative: 8 %
Neutro Abs: 2.3 10*3/uL (ref 1.7–7.7)
Neutrophils Relative %: 52 %
Platelet Count: 235 10*3/uL (ref 150–400)
RBC: 4.32 MIL/uL (ref 3.87–5.11)
RDW: 13.6 % (ref 11.5–15.5)
WBC Count: 4.3 10*3/uL (ref 4.0–10.5)
nRBC: 0 % (ref 0.0–0.2)

## 2022-08-14 LAB — LACTATE DEHYDROGENASE: LDH: 166 U/L (ref 98–192)

## 2022-08-21 DIAGNOSIS — K5900 Constipation, unspecified: Secondary | ICD-10-CM | POA: Diagnosis not present

## 2022-08-21 DIAGNOSIS — M6281 Muscle weakness (generalized): Secondary | ICD-10-CM | POA: Diagnosis not present

## 2022-08-21 DIAGNOSIS — N393 Stress incontinence (female) (male): Secondary | ICD-10-CM | POA: Diagnosis not present

## 2022-09-04 ENCOUNTER — Other Ambulatory Visit: Payer: Self-pay

## 2022-09-04 ENCOUNTER — Inpatient Hospital Stay: Payer: BC Managed Care – PPO | Attending: Hematology | Admitting: Hematology

## 2022-09-04 VITALS — BP 137/64 | HR 85 | Temp 97.9°F | Resp 20 | Wt 210.7 lb

## 2022-09-04 DIAGNOSIS — C8338 Diffuse large B-cell lymphoma, lymph nodes of multiple sites: Secondary | ICD-10-CM | POA: Diagnosis not present

## 2022-09-04 DIAGNOSIS — N393 Stress incontinence (female) (male): Secondary | ICD-10-CM | POA: Diagnosis not present

## 2022-09-04 DIAGNOSIS — K5900 Constipation, unspecified: Secondary | ICD-10-CM | POA: Diagnosis not present

## 2022-09-04 DIAGNOSIS — M6281 Muscle weakness (generalized): Secondary | ICD-10-CM | POA: Diagnosis not present

## 2022-09-04 NOTE — Progress Notes (Signed)
HEMATOLOGY/ONCOLOGY TELE-MED VISIT NOTE  Date of Service: 09/04/22   Patient Care Team: Sheela Stack as PCP - General (Physician Assistant)  CHIEF COMPLAINTS/PURPOSE OF CONSULTATION:  Follow-up for continued evaluation and management of diffuse large B-cell lymphoma  HISTORY OF PRESENTING ILLNESS:  Please see previous note for details on initial presentation  INTERVAL HISTORY:  Barbara Rhodes is a 67 y.o. female is connected via phone for continued evaluation of diffuse large B-cell lymphoma.  .I connected with Barbara Rhodes on 09/04/2022 at 12:30 PM EDT by telephone visit and verified that I am speaking with the correct person using two identifiers.   Patient was last seen by me on 05/08/2022 and she complained of back pain and stability issues, but was doing well overall.   Patient notes she has been doing well overall without any new or severe medical concerns. She does complain of chronic leg jerking movement.   She denies any new infection issues, fever, chills, night sweats, new lumps/bumps, shortness of breath, abdominal pain, abnormal bowel movement, chest pain, or leg swelling.   I discussed the limitations, risks, security and privacy concerns of performing an evaluation and management service by telemedicine and the availability of in-person appointments. I also discussed with the patient that there may be a patient responsible charge related to this service. The patient expressed understanding and agreed to proceed.   Other persons participating in the visit and their role in the encounter: None   Patient's location: Home  Provider's location: [facility]   Chief Complaint: Follow-up for continued evaluation and management of diffuse large B-cell lymphoma    MEDICAL HISTORY:  Past Medical History:  Diagnosis Date   Arthritis    Breast cancer (HCC) 2000   Stage 2B, ER+/PR-/Her2-   Colon polyps    Family history of adverse  reaction to anesthesia 56   Brother passed away at 30 years old from malignant hyperthermia.   Family history of breast cancer    Family history of prostate cancer    Hypertension     SURGICAL HISTORY: Past Surgical History:  Procedure Laterality Date   ABDOMINAL HYSTERECTOMY     BACK SURGERY     BREAST SURGERY     COLONOSCOPY WITH PROPOFOL     pt said propofol burned her throat when pushed   IR US GUIDE BX ASP/DRAIN  04/03/2021   LYMPH NODE BIOPSY Left 06/02/2021   Procedure: EXCISIONAL CERVICAL LYMPH NODE BIOPSY;  Surgeon: Griselda Miner, MD;  Location: [facility];  Service: General;  Laterality: Left;   MASTECTOMY Right 2000   MASTECTOMY Right 2000   PORT-A-CATH REMOVAL N/A 12/08/2021   Procedure: REMOVAL PORT-A-CATH;  Surgeon: Griselda Miner, MD;  Location: [facility] OR;  Service: General;  Laterality: N/A;   PORTACATH PLACEMENT Right 06/02/2021   Procedure: INSERTION PORT-A-CATH;  Surgeon: Griselda Miner, MD;  Location: [facility];  Service: General;  Laterality: Right;   POSTERIOR CERVICAL LAMINECTOMY Left 01/23/2018   Procedure: LEFT CERVICAL SEVEN- THORACIC ONE LAMINOTOMY,FORAMINOTOMY, MICRODISCECTOMY;  Surgeon: Shirlean Kelly, MD;  Location: [facility] OR;  Service: Neurosurgery;  Laterality: Left;   ROTATOR CUFF REPAIR     TONSILLECTOMY     TRANSFORAMINAL LUMBAR INTERBODY FUSION (TLIF) WITH PEDICLE SCREW FIXATION 2 LEVEL Right 10/30/2019   Procedure: Right Lumbar 3-4 Lumbar 4-5 Transforaminal lumbar interbody fusion;  Surgeon: Maeola Harman, MD;  Location: [facility] OR;  Service: Neurosurgery;  Laterality: Right;  3C/RM 21   TUBAL LIGATION  WISDOM TOOTH EXTRACTION      SOCIAL HISTORY: Social History   Socioeconomic History   Marital status: Married    Spouse name: Not on file   Number of children: Not on file   Years of education: Not on file   Highest education level: Not on file  Occupational History   Not on file  Tobacco Use   Smoking status: Never   Smokeless tobacco: Never  Vaping  Use   Vaping status: Never Used  Substance and Sexual Activity   Alcohol use: No   Drug use: No   Sexual activity: Not on file  Other Topics Concern   Not on file  Social History Narrative   Not on file   Social Determinants of Health   Financial Resource Strain: Not on file  Food Insecurity: Not on file  Transportation Needs: Not on file  Physical Activity: Not on file  Stress: Not on file  Social Connections: Not on file  Intimate Partner Violence: Not on file    FAMILY HISTORY: Family History  Problem Relation Age of Onset   Breast cancer Paternal Grandmother 62   Prostate cancer Father        dx in his 34s   Colon polyps Father 60   Dementia Mother    Hypertension Mother    Dementia Maternal Aunt    Stroke Maternal Grandfather    Rheum arthritis Paternal Grandfather    Breast cancer Other        MGM's sister, post-menopausal breast cancer   Breast cancer Cousin 62       paternal 2nd cousin    ALLERGIES:  is allergic to penicillins and rituximab-pvvr.  MEDICATIONS:  Current Outpatient Medications  Medication Sig Dispense Refill   acetaminophen (TYLENOL) 500 MG tablet Take 1,000 mg by mouth daily as needed for mild pain or moderate pain.     B Complex-C (B-COMPLEX WITH VITAMIN C) tablet Take 1 tablet by mouth daily.     calcium carbonate (OSCAL) 1500 (600 Ca) MG TABS tablet Take 600 mg of elemental calcium by mouth daily.     calcium citrate (CALCITRATE - DOSED IN MG ELEMENTAL CALCIUM) 950 (200 Ca) MG tablet Take 200 mg of elemental calcium by mouth daily.     celecoxib (CELEBREX) 100 MG capsule Take 100 mg by mouth daily.     Cholecalciferol (VITAMIN D3) 50 MCG (2000 UT) capsule Take 4,000 Units by mouth daily.     docusate sodium (COLACE) 100 MG capsule Take 100 mg by mouth daily as needed for mild constipation.     Magnesium 400 MG TABS Take 400 mg by mouth daily.     Omega-3 Fatty Acids (FISH OIL ULTRA) 1400 MG CAPS Take 1,400 mg by mouth daily.      Potassium 99 MG TABS Take 99 mg by mouth daily.     triamterene-hydrochlorothiazide (MAXZIDE-25) 37.5-25 MG tablet Take 1 tablet by mouth daily.      vitamin B-12 (CYANOCOBALAMIN) 100 MCG tablet Take 100 mcg by mouth daily.     No current facility-administered medications for this visit.    REVIEW OF SYSTEMS:   10 Point review of Systems was done is negative except as noted above.   PHYSICAL EXAMINATION: TELE-MED VISIT   LABORATORY DATA:  I have reviewed the data as listed    Latest Ref Rng & Units 08/14/2022    8:37 AM 05/08/2022    8:43 AM 02/06/2022    9:00 AM  CBC  WBC  4.0 - 10.5 K/uL 4.3  4.6  6.0   Hemoglobin 12.0 - 15.0 g/dL 16.1  09.6  04.5   Hematocrit 36.0 - 46.0 % 39.5  39.3  40.2   Platelets 150 - 400 K/uL 235  243  276       Latest Ref Rng & Units 08/14/2022    8:37 AM 05/08/2022    8:43 AM 02/06/2022    9:00 AM  CMP  Glucose 70 - 99 mg/dL 99  409  74   BUN 8 - 23 mg/dL 12  19  12    Creatinine 0.44 - 1.00 mg/dL 8.11  9.14  7.82   Sodium 135 - 145 mmol/L 143  141  142   Potassium 3.5 - 5.1 mmol/L 4.4  3.5  3.9   Chloride 98 - 111 mmol/L 107  103  105   CO2 22 - 32 mmol/L 32  30  29   Calcium 8.9 - 10.3 mg/dL 9.5  9.7  9.9   Total Protein 6.5 - 8.1 g/dL 6.3  7.0  6.7   Total Bilirubin 0.3 - 1.2 mg/dL 0.6  0.7  0.4   Alkaline Phos 38 - 126 U/L 78  79  84   AST 15 - 41 U/L 17  18  18    ALT 0 - 44 U/L 15  18  18     Lab Results  Component Value Date   LDH 166 08/14/2022    SURGICAL PATHOLOGY 04/03/2021 FINAL MICROSCOPIC DIAGNOSIS:  A. LYMPH NODE, LEFT SUPRACLAVICULAR, NEEDLE CORE BIOPSY:  -Atypical lymphoid proliferation  -See comment   COMMENT:  The sections show several very small needle core biopsy fragments of  apparently lymph nodal tissue displaying an atypical lymphoid  proliferation of primarily large lymphoid appearing cells characterized  by vesicular chromatin and prominent nucleoli associated with scattered  mitosis.  The appearance is  apparently diffuse with lack of atypical  follicles.  Flow cytometric analysis was performed [facility] [id number]) and  shows predominance of T lymphocytes with nonspecific changes.  No  monoclonal B-cell population identified.  A battery of  immunohistochemical stains was performed but the tissue is prominently  exhausted on deeper sectioning.  Scattered residual very minute  fragments show that the lymphoid appearing cells are positive for LCA,  CD20, CD79a, BCL6, MUM1, and Bcl-2.  No significant staining is seen  with CD138, CD10, CD34, cyclin D1, S100, cytokeratin AE1/AE3,  cytokeratin 8/18, or GATA3.  There is an admixed T-cell population to a  lesser extent as seen with CD3 and CD5 and there is no apparent  co-expression of CD5 in B-cell areas.  The following stains were  performed but there is no optimal tissue present on deeper sectioning  for evaluation including CD30, TdT, and in situ hybridization for kappa,  lambda and EBV.  The overall findings are limited but very concerning  for involvement by a B-cell lymphoproliferative process particularly  large cell lymphoma.  Excisional biopsy is recommended to further  evaluate this process.   SURGICAL PATHOLOGY 06/02/2021 FINAL MICROSCOPIC DIAGNOSIS:  A. LYMPH NODE, LEFT NECK, EXCISION:  -  Diffuse large B-cell lymphoma, activated B-cell subtype  -  See comment   COMMENT:  The excisional biopsy consists of an enlarged lymph node with complete  effacement by a large pleomorphic population of lymphoid cells with  vesicular chromatin and prominent nucleoli.  By immunohistochemistry,  the atypical lymphocytes are positive for CD20, PAX5, Bcl-2, BCL6 and  Mum-1.  The cells are negative  for CD5, CD10, CD30 and EBV by in situ  hybridization.  The proliferative rate by Ki-67 is 60-70%.  CD3  highlights background benign-appearing lymphocytes.  Overall, the  findings are consistent with a diffuse large B cell lymphoma, activated  B-cell  phenotype.  Preliminary results of this case were discussed with  Dr. Lance Bosch on Jun 06, 2021.  FISH studies (high-grade/large B-cell  lymphoma) are pending and will be reported in an addendum.     RADIOGRAPHIC STUDIES: I have personally reviewed the radiological images as listed and agreed with the findings in the report. No results found.   ASSESSMENT & PLAN:  67 y.o. retired [employer] fund[employer] with previous history of right-sided breast cancer status post right mastectomy and adjuvant chemotherapy including doxorubicin now with:  #1 stage III/IV diffuse large B-cell lymphoma.-Activated B-cell subtype High risk lymphoma FISH panel no evidence of double or triple hit lymphoma #2 anemia due to lymphoma involvement of bone marrow #3 status post Port-A-Cath placement #4 thrombocytopenia secondary to #1 #5 high risk of tumor lysis syndrome #6 anxiety #7 remote history of right breast cancer status postmastectomy and adjuvant chemotherapy #8 rigors with Rituxan dose being At 200 mg/h  Plan: -Discussed lab results from today, 08/14/2022, with the patient. CBC and CMP are stable.  - Patient has no lab or clinical evidence of recurrence/progression of DLBCL at this time. -Answered all of patient's questions.  -Will follow-up in 3 months.   Follow-up: RTC with dr Candise Che with labs in 3 months  The total time spent in the appointment was 10 minutes* .  All of the patient's questions were answered with apparent satisfaction. The patient knows to call the clinic with any problems, questions or concerns.   Wyvonnia Lora MD MS AAHIVMS [facility] Hematology/Oncology Physician [facility]  .*Total Encounter Time as defined by the Centers for Medicare and Medicaid Services includes, in addition to the face-to-face time of a patient visit (documented in the note above) non-face-to-face time: obtaining and reviewing outside history, ordering and reviewing medications, tests or procedures, care  coordination (communications with other health care professionals or caregivers) and documentation in the medical record.   I,Param Shah,acting as a Neurosurgeon for Wyvonnia Lora, MD.,have documented all relevant documentation on the behalf of Wyvonnia Lora, MD,as directed by  Wyvonnia Lora, MD while in the presence of Wyvonnia Lora, MD.   .I have reviewed the above documentation for accuracy and completeness, and I agree with the above. Johney Maine MD

## 2022-09-06 ENCOUNTER — Other Ambulatory Visit: Payer: BC Managed Care – PPO

## 2022-09-06 ENCOUNTER — Ambulatory Visit: Payer: BC Managed Care – PPO | Admitting: Hematology

## 2022-09-06 ENCOUNTER — Telehealth: Payer: Self-pay | Admitting: Hematology

## 2022-09-06 NOTE — Telephone Encounter (Signed)
Patient is aware of upcoming appointment times/dates.  

## 2022-09-10 ENCOUNTER — Encounter: Payer: Self-pay | Admitting: Hematology

## 2022-09-25 DIAGNOSIS — N393 Stress incontinence (female) (male): Secondary | ICD-10-CM | POA: Diagnosis not present

## 2022-09-25 DIAGNOSIS — M6281 Muscle weakness (generalized): Secondary | ICD-10-CM | POA: Diagnosis not present

## 2022-09-25 DIAGNOSIS — K5900 Constipation, unspecified: Secondary | ICD-10-CM | POA: Diagnosis not present

## 2022-10-09 DIAGNOSIS — D1721 Benign lipomatous neoplasm of skin and subcutaneous tissue of right arm: Secondary | ICD-10-CM | POA: Diagnosis not present

## 2022-10-09 DIAGNOSIS — D225 Melanocytic nevi of trunk: Secondary | ICD-10-CM | POA: Diagnosis not present

## 2022-10-09 DIAGNOSIS — L570 Actinic keratosis: Secondary | ICD-10-CM | POA: Diagnosis not present

## 2022-10-09 DIAGNOSIS — L814 Other melanin hyperpigmentation: Secondary | ICD-10-CM | POA: Diagnosis not present

## 2022-10-09 DIAGNOSIS — L821 Other seborrheic keratosis: Secondary | ICD-10-CM | POA: Diagnosis not present

## 2022-10-18 DIAGNOSIS — H353112 Nonexudative age-related macular degeneration, right eye, intermediate dry stage: Secondary | ICD-10-CM | POA: Diagnosis not present

## 2022-10-24 DIAGNOSIS — M48061 Spinal stenosis, lumbar region without neurogenic claudication: Secondary | ICD-10-CM | POA: Diagnosis not present

## 2022-10-24 DIAGNOSIS — Z6835 Body mass index (BMI) 35.0-35.9, adult: Secondary | ICD-10-CM | POA: Diagnosis not present

## 2022-10-24 DIAGNOSIS — M21371 Foot drop, right foot: Secondary | ICD-10-CM | POA: Diagnosis not present

## 2022-11-01 ENCOUNTER — Other Ambulatory Visit: Payer: Self-pay | Admitting: Medical Genetics

## 2022-11-01 DIAGNOSIS — Z006 Encounter for examination for normal comparison and control in clinical research program: Secondary | ICD-10-CM

## 2022-12-03 ENCOUNTER — Other Ambulatory Visit: Payer: Self-pay

## 2022-12-03 DIAGNOSIS — C8338 Diffuse large B-cell lymphoma, lymph nodes of multiple sites: Secondary | ICD-10-CM

## 2022-12-03 DIAGNOSIS — M4802 Spinal stenosis, cervical region: Secondary | ICD-10-CM | POA: Diagnosis not present

## 2022-12-03 DIAGNOSIS — I10 Essential (primary) hypertension: Secondary | ICD-10-CM | POA: Diagnosis not present

## 2022-12-03 DIAGNOSIS — C8510 Unspecified B-cell lymphoma, unspecified site: Secondary | ICD-10-CM | POA: Diagnosis not present

## 2022-12-03 DIAGNOSIS — M545 Low back pain, unspecified: Secondary | ICD-10-CM | POA: Diagnosis not present

## 2022-12-04 ENCOUNTER — Inpatient Hospital Stay: Payer: BC Managed Care – PPO | Attending: Hematology

## 2022-12-04 ENCOUNTER — Inpatient Hospital Stay: Payer: BC Managed Care – PPO | Admitting: Hematology

## 2022-12-04 VITALS — BP 139/77 | HR 75 | Temp 97.5°F | Resp 18 | Wt 209.7 lb

## 2022-12-04 DIAGNOSIS — Z79899 Other long term (current) drug therapy: Secondary | ICD-10-CM | POA: Insufficient documentation

## 2022-12-04 DIAGNOSIS — Z803 Family history of malignant neoplasm of breast: Secondary | ICD-10-CM | POA: Insufficient documentation

## 2022-12-04 DIAGNOSIS — D6959 Other secondary thrombocytopenia: Secondary | ICD-10-CM | POA: Diagnosis not present

## 2022-12-04 DIAGNOSIS — C8338 Diffuse large B-cell lymphoma, lymph nodes of multiple sites: Secondary | ICD-10-CM

## 2022-12-04 DIAGNOSIS — Z83719 Family history of colon polyps, unspecified: Secondary | ICD-10-CM | POA: Diagnosis not present

## 2022-12-04 DIAGNOSIS — D630 Anemia in neoplastic disease: Secondary | ICD-10-CM | POA: Diagnosis not present

## 2022-12-04 DIAGNOSIS — Z9011 Acquired absence of right breast and nipple: Secondary | ICD-10-CM | POA: Insufficient documentation

## 2022-12-04 DIAGNOSIS — R6889 Other general symptoms and signs: Secondary | ICD-10-CM | POA: Insufficient documentation

## 2022-12-04 DIAGNOSIS — F419 Anxiety disorder, unspecified: Secondary | ICD-10-CM | POA: Insufficient documentation

## 2022-12-04 DIAGNOSIS — C8330 Diffuse large B-cell lymphoma, unspecified site: Secondary | ICD-10-CM | POA: Insufficient documentation

## 2022-12-04 DIAGNOSIS — Z853 Personal history of malignant neoplasm of breast: Secondary | ICD-10-CM | POA: Diagnosis not present

## 2022-12-04 DIAGNOSIS — Z8042 Family history of malignant neoplasm of prostate: Secondary | ICD-10-CM | POA: Insufficient documentation

## 2022-12-04 LAB — CBC WITH DIFFERENTIAL (CANCER CENTER ONLY)
Abs Immature Granulocytes: 0.02 10*3/uL (ref 0.00–0.07)
Basophils Absolute: 0 10*3/uL (ref 0.0–0.1)
Basophils Relative: 0 %
Eosinophils Absolute: 0.2 10*3/uL (ref 0.0–0.5)
Eosinophils Relative: 5 %
HCT: 39.7 % (ref 36.0–46.0)
Hemoglobin: 13.7 g/dL (ref 12.0–15.0)
Immature Granulocytes: 0 %
Lymphocytes Relative: 35 %
Lymphs Abs: 1.7 10*3/uL (ref 0.7–4.0)
MCH: 31.3 pg (ref 26.0–34.0)
MCHC: 34.5 g/dL (ref 30.0–36.0)
MCV: 90.6 fL (ref 80.0–100.0)
Monocytes Absolute: 0.4 10*3/uL (ref 0.1–1.0)
Monocytes Relative: 8 %
Neutro Abs: 2.4 10*3/uL (ref 1.7–7.7)
Neutrophils Relative %: 52 %
Platelet Count: 227 10*3/uL (ref 150–400)
RBC: 4.38 MIL/uL (ref 3.87–5.11)
RDW: 13.2 % (ref 11.5–15.5)
WBC Count: 4.7 10*3/uL (ref 4.0–10.5)
nRBC: 0 % (ref 0.0–0.2)

## 2022-12-04 LAB — CMP (CANCER CENTER ONLY)
ALT: 20 U/L (ref 0–44)
AST: 20 U/L (ref 15–41)
Albumin: 4.2 g/dL (ref 3.5–5.0)
Alkaline Phosphatase: 97 U/L (ref 38–126)
Anion gap: 6 (ref 5–15)
BUN: 17 mg/dL (ref 8–23)
CO2: 32 mmol/L (ref 22–32)
Calcium: 9.4 mg/dL (ref 8.9–10.3)
Chloride: 104 mmol/L (ref 98–111)
Creatinine: 0.83 mg/dL (ref 0.44–1.00)
GFR, Estimated: >60 mL/min (ref >60)
Glucose, Bld: 107 mg/dL — ABNORMAL HIGH (ref 70–99)
Potassium: 3.8 mmol/L (ref 3.5–5.1)
Sodium: 142 mmol/L (ref 135–145)
Total Bilirubin: 0.7 mg/dL (ref <1.2)
Total Protein: 6.6 g/dL (ref 6.5–8.1)

## 2022-12-04 LAB — LACTATE DEHYDROGENASE: LDH: 174 U/L (ref 98–192)

## 2022-12-04 NOTE — Progress Notes (Signed)
HEMATOLOGY/ONCOLOGY CLINIC NOTE  Date of Service: 12/04/22   Patient Care Team: Sheela Stack as PCP - General (Physician Assistant)  CHIEF COMPLAINTS/PURPOSE OF CONSULTATION:  Follow-up for continued evaluation and management of diffuse large B-cell lymphoma  HISTORY OF PRESENTING ILLNESS:  Please see previous note for details on initial presentation  INTERVAL HISTORY:  Barbara Rhodes is a 67 y.o. female is connected via phone for continued evaluation of diffuse large B-cell lymphoma.  I last connected with the patient on 09/04/2022 and she complained of chronic leg jerking movement, but she was doing well overall.   Patient notes she has been doing well overall without any new or severe medical concerns since our last visit. She denies any new infection issues, fever, chills, night sweats, unexpected weight loss, back pain, bone pain, chest pain, or leg swelling. She does complain of chronic leg jerking.   She has discontinued her blood pressure medication. She notes that her Neurologist has changed her Baclofen dosage to 10 mg.      MEDICAL HISTORY:  Past Medical History:  Diagnosis Date   Arthritis    Breast cancer (HCC) 2000   Stage 2B, ER+/PR-/Her2-   Colon polyps    Family history of adverse reaction to anesthesia 68   Brother passed away at 30 years old from malignant hyperthermia.   Family history of breast cancer    Family history of prostate cancer    Hypertension     SURGICAL HISTORY: Past Surgical History:  Procedure Laterality Date   ABDOMINAL HYSTERECTOMY     BACK SURGERY     BREAST SURGERY     COLONOSCOPY WITH PROPOFOL     pt said propofol burned her throat when pushed   IR US GUIDE BX ASP/DRAIN  04/03/2021   LYMPH NODE BIOPSY Left 06/02/2021   Procedure: EXCISIONAL CERVICAL LYMPH NODE BIOPSY;  Surgeon: Griselda Miner, MD;  Location: [facility];  Service: General;  Laterality: Left;   MASTECTOMY Right 2000   MASTECTOMY Right  2000   PORT-A-CATH REMOVAL N/A 12/08/2021   Procedure: REMOVAL PORT-A-CATH;  Surgeon: Griselda Miner, MD;  Location: [facility] OR;  Service: General;  Laterality: N/A;   PORTACATH PLACEMENT Right 06/02/2021   Procedure: INSERTION PORT-A-CATH;  Surgeon: Griselda Miner, MD;  Location: [facility];  Service: General;  Laterality: Right;   POSTERIOR CERVICAL LAMINECTOMY Left 01/23/2018   Procedure: LEFT CERVICAL SEVEN- THORACIC ONE LAMINOTOMY,FORAMINOTOMY, MICRODISCECTOMY;  Surgeon: Shirlean Kelly, MD;  Location: [facility] OR;  Service: Neurosurgery;  Laterality: Left;   ROTATOR CUFF REPAIR     TONSILLECTOMY     TRANSFORAMINAL LUMBAR INTERBODY FUSION (TLIF) WITH PEDICLE SCREW FIXATION 2 LEVEL Right 10/30/2019   Procedure: Right Lumbar 3-4 Lumbar 4-5 Transforaminal lumbar interbody fusion;  Surgeon: Maeola Harman, MD;  Location: [facility] OR;  Service: Neurosurgery;  Laterality: Right;  3C/RM 21   TUBAL LIGATION     WISDOM TOOTH EXTRACTION      SOCIAL HISTORY: Social History   Socioeconomic History   Marital status: Married    Spouse name: Not on file   Number of children: Not on file   Years of education: Not on file   Highest education level: Not on file  Occupational History   Not on file  Tobacco Use   Smoking status: Never   Smokeless tobacco: Never  Vaping Use   Vaping status: Never Used  Substance and Sexual Activity   Alcohol use: No   Drug use: No  Sexual activity: Not on file  Other Topics Concern   Not on file  Social History Narrative   Not on file   Social Determinants of Health   Financial Resource Strain: Not on file  Food Insecurity: Not on file  Transportation Needs: Not on file  Physical Activity: Not on file  Stress: Not on file  Social Connections: Not on file  Intimate Partner Violence: Not on file    FAMILY HISTORY: Family History  Problem Relation Age of Onset   Breast cancer Paternal Grandmother 65   Prostate cancer Father        dx in his 43s   Colon polyps Father 98    Dementia Mother    Hypertension Mother    Dementia Maternal Aunt    Stroke Maternal Grandfather    Rheum arthritis Paternal Grandfather    Breast cancer Other        MGM's sister, post-menopausal breast cancer   Breast cancer Cousin 64       paternal 2nd cousin    ALLERGIES:  is allergic to penicillins and rituximab-pvvr.  MEDICATIONS:  Current Outpatient Medications  Medication Sig Dispense Refill   acetaminophen (TYLENOL) 500 MG tablet Take 1,000 mg by mouth daily as needed for mild pain or moderate pain.     B Complex-C (B-COMPLEX WITH VITAMIN C) tablet Take 1 tablet by mouth daily.     calcium carbonate (OSCAL) 1500 (600 Ca) MG TABS tablet Take 600 mg of elemental calcium by mouth daily.     calcium citrate (CALCITRATE - DOSED IN MG ELEMENTAL CALCIUM) 950 (200 Ca) MG tablet Take 200 mg of elemental calcium by mouth daily.     celecoxib (CELEBREX) 100 MG capsule Take 100 mg by mouth daily.     Cholecalciferol (VITAMIN D3) 50 MCG (2000 UT) capsule Take 4,000 Units by mouth daily.     docusate sodium (COLACE) 100 MG capsule Take 100 mg by mouth daily as needed for mild constipation.     Magnesium 400 MG TABS Take 400 mg by mouth daily.     Omega-3 Fatty Acids (FISH OIL ULTRA) 1400 MG CAPS Take 1,400 mg by mouth daily.     Potassium 99 MG TABS Take 99 mg by mouth daily.     triamterene-hydrochlorothiazide (MAXZIDE-25) 37.5-25 MG tablet Take 1 tablet by mouth daily.      vitamin B-12 (CYANOCOBALAMIN) 100 MCG tablet Take 100 mcg by mouth daily.     No current facility-administered medications for this visit.    REVIEW OF SYSTEMS:   10 Point review of Systems was done is negative except as noted above.   PHYSICAL EXAMINATION:  .BP 139/77   Pulse 75   Temp (!) 97.5 F (36.4 C)   Resp 18   Wt 209 lb 11.2 oz (95.1 kg)   SpO2 97%   BMI 36.56 kg/m  . GENERAL:alert, in no acute distress and comfortable SKIN: no acute rashes, no significant lesions EYES: conjunctiva are pink  and non-injected, sclera anicteric OROPHARYNX: MMM, no exudates, no oropharyngeal erythema or ulceration NECK: supple, no JVD LYMPH:  no palpable lymphadenopathy in the cervical, axillary or inguinal regions LUNGS: clear to auscultation b/l with normal respiratory effort HEART: regular rate & rhythm ABDOMEN:  normoactive bowel sounds , non tender, not distended. Extremity: no pedal edema PSYCH: alert & oriented x 3 with fluent speech NEURO: no focal motor/sensory deficits    LABORATORY DATA:  I have reviewed the data as listed  Latest Ref Rng & Units 12/04/2022    8:49 AM 08/14/2022    8:37 AM 05/08/2022    8:43 AM  CBC  WBC 4.0 - 10.5 K/uL 4.7  4.3  4.6   Hemoglobin 12.0 - 15.0 g/dL 16.1  09.6  04.5   Hematocrit 36.0 - 46.0 % 39.7  39.5  39.3   Platelets 150 - 400 K/uL 227  235  243       Latest Ref Rng & Units 12/04/2022    8:49 AM 08/14/2022    8:37 AM 05/08/2022    8:43 AM  CMP  Glucose 70 - 99 mg/dL 409  99  811   BUN 8 - 23 mg/dL 17  12  19    Creatinine 0.44 - 1.00 mg/dL 9.14  7.82  9.56   Sodium 135 - 145 mmol/L 142  143  141   Potassium 3.5 - 5.1 mmol/L 3.8  4.4  3.5   Chloride 98 - 111 mmol/L 104  107  103   CO2 22 - 32 mmol/L 32  32  30   Calcium 8.9 - 10.3 mg/dL 9.4  9.5  9.7   Total Protein 6.5 - 8.1 g/dL 6.6  6.3  7.0   Total Bilirubin <1.2 mg/dL 0.7  0.6  0.7   Alkaline Phos 38 - 126 U/L 97  78  79   AST 15 - 41 U/L 20  17  18    ALT 0 - 44 U/L 20  15  18     Lab Results  Component Value Date   LDH 174 12/04/2022    SURGICAL PATHOLOGY 04/03/2021 FINAL MICROSCOPIC DIAGNOSIS:  A. LYMPH NODE, LEFT SUPRACLAVICULAR, NEEDLE CORE BIOPSY:  -Atypical lymphoid proliferation  -See comment   COMMENT:  The sections show several very small needle core biopsy fragments of  apparently lymph nodal tissue displaying an atypical lymphoid  proliferation of primarily large lymphoid appearing cells characterized  by vesicular chromatin and prominent nucleoli associated  with scattered  mitosis.  The appearance is apparently diffuse with lack of atypical  follicles.  Flow cytometric analysis was performed [facility] [id number]) and  shows predominance of T lymphocytes with nonspecific changes.  No  monoclonal B-cell population identified.  A battery of  immunohistochemical stains was performed but the tissue is prominently  exhausted on deeper sectioning.  Scattered residual very minute  fragments show that the lymphoid appearing cells are positive for LCA,  CD20, CD79a, BCL6, MUM1, and Bcl-2.  No significant staining is seen  with CD138, CD10, CD34, cyclin D1, S100, cytokeratin AE1/AE3,  cytokeratin 8/18, or GATA3.  There is an admixed T-cell population to a  lesser extent as seen with CD3 and CD5 and there is no apparent  co-expression of CD5 in B-cell areas.  The following stains were  performed but there is no optimal tissue present on deeper sectioning  for evaluation including CD30, TdT, and in situ hybridization for kappa,  lambda and EBV.  The overall findings are limited but very concerning  for involvement by a B-cell lymphoproliferative process particularly  large cell lymphoma.  Excisional biopsy is recommended to further  evaluate this process.   SURGICAL PATHOLOGY 06/02/2021 FINAL MICROSCOPIC DIAGNOSIS:  A. LYMPH NODE, LEFT NECK, EXCISION:  -  Diffuse large B-cell lymphoma, activated B-cell subtype  -  See comment   COMMENT:  The excisional biopsy consists of an enlarged lymph node with complete  effacement by a large pleomorphic population of lymphoid cells with  vesicular chromatin  and prominent nucleoli.  By immunohistochemistry,  the atypical lymphocytes are positive for CD20, PAX5, Bcl-2, BCL6 and  Mum-1.  The cells are negative for CD5, CD10, CD30 and EBV by in situ  hybridization.  The proliferative rate by Ki-67 is 60-70%.  CD3  highlights background benign-appearing lymphocytes.  Overall, the  findings are consistent with a diffuse  large B cell lymphoma, activated  B-cell phenotype.  Preliminary results of this case were discussed with  Dr. Lance Bosch on Jun 06, 2021.  FISH studies (high-grade/large B-cell  lymphoma) are pending and will be reported in an addendum.     RADIOGRAPHIC STUDIES: I have personally reviewed the radiological images as listed and agreed with the findings in the report. No results found.   ASSESSMENT & PLAN:  67 y.o. retired [employer] fund[employer] with previous history of right-sided breast cancer status post right mastectomy and adjuvant chemotherapy including doxorubicin now with:  #1 stage III/IV diffuse large B-cell lymphoma.-Activated B-cell subtype High risk lymphoma FISH panel no evidence of double or triple hit lymphoma #2 anemia due to lymphoma involvement of bone marrow #3 status post Port-A-Cath placement #4 thrombocytopenia secondary to #1 #5 high risk of tumor lysis syndrome #6 anxiety #7 remote history of right breast cancer status postmastectomy and adjuvant chemotherapy #8 rigors with Rituxan dose being At 200 mg/h  Plan: -Discussed lab results from today, 12/04/2022, in detail with the patient. CBC is stable. CMP stable.  -Recommend influenza vaccine, COVID-19 Booster, RSV vaccine, and other age related vaccines.  -Discussed the option of CT scan around 1 year after completing treatment. Patient is around 1 year out. Patient agrees.  -Schedule patient for CT scan in 3 months before our next visit.  - Patient has no lab or clinical evidence of recurrence/progression of DLBCL at this time. -Answered all of patient's questions.  Ct chest/abd/pelvis in 10 weeks RTC with Dr Candise Che with labs in 3 months   Follow-up: Ct chest/abd/pelvis in 10 weeks RTC with Dr Candise Che with labs in 3 months  The total time spent in the appointment was 23 minutes* .  All of the patient's questions were answered with apparent satisfaction. The patient knows to call the clinic with any problems, questions or  concerns.   Wyvonnia Lora MD MS AAHIVMS [facility] Hematology/Oncology Physician [facility]  .*Total Encounter Time as defined by the Centers for Medicare and Medicaid Services includes, in addition to the face-to-face time of a patient visit (documented in the note above) non-face-to-face time: obtaining and reviewing outside history, ordering and reviewing medications, tests or procedures, care coordination (communications with other health care professionals or caregivers) and documentation in the medical record.   I,Param Shah,acting as a Neurosurgeon for Wyvonnia Lora, MD.,have documented all relevant documentation on the behalf of Wyvonnia Lora, MD,as directed by  Wyvonnia Lora, MD while in the presence of Wyvonnia Lora, MD.  .I have reviewed the above documentation for accuracy and completeness, and I agree with the above. Johney Maine MD

## 2022-12-06 ENCOUNTER — Telehealth: Payer: Self-pay | Admitting: Hematology

## 2022-12-06 NOTE — Telephone Encounter (Signed)
Patient is aware of scheduled appointment times/dates for follow up visit per Dr. Candise Che scheduling orders on 12/04/2022

## 2022-12-10 ENCOUNTER — Encounter: Payer: Self-pay | Admitting: Hematology

## 2023-02-04 DIAGNOSIS — R269 Unspecified abnormalities of gait and mobility: Secondary | ICD-10-CM | POA: Diagnosis not present

## 2023-02-04 DIAGNOSIS — M546 Pain in thoracic spine: Secondary | ICD-10-CM | POA: Diagnosis not present

## 2023-02-04 DIAGNOSIS — M6281 Muscle weakness (generalized): Secondary | ICD-10-CM | POA: Diagnosis not present

## 2023-02-05 DIAGNOSIS — M6281 Muscle weakness (generalized): Secondary | ICD-10-CM | POA: Diagnosis not present

## 2023-02-05 DIAGNOSIS — R269 Unspecified abnormalities of gait and mobility: Secondary | ICD-10-CM | POA: Diagnosis not present

## 2023-02-05 DIAGNOSIS — M546 Pain in thoracic spine: Secondary | ICD-10-CM | POA: Diagnosis not present

## 2023-02-12 DIAGNOSIS — M6281 Muscle weakness (generalized): Secondary | ICD-10-CM | POA: Diagnosis not present

## 2023-02-12 DIAGNOSIS — R269 Unspecified abnormalities of gait and mobility: Secondary | ICD-10-CM | POA: Diagnosis not present

## 2023-02-12 DIAGNOSIS — M546 Pain in thoracic spine: Secondary | ICD-10-CM | POA: Diagnosis not present

## 2023-02-19 DIAGNOSIS — R269 Unspecified abnormalities of gait and mobility: Secondary | ICD-10-CM | POA: Diagnosis not present

## 2023-02-19 DIAGNOSIS — M6281 Muscle weakness (generalized): Secondary | ICD-10-CM | POA: Diagnosis not present

## 2023-02-19 DIAGNOSIS — M546 Pain in thoracic spine: Secondary | ICD-10-CM | POA: Diagnosis not present

## 2023-02-26 DIAGNOSIS — M6281 Muscle weakness (generalized): Secondary | ICD-10-CM | POA: Diagnosis not present

## 2023-02-26 DIAGNOSIS — R269 Unspecified abnormalities of gait and mobility: Secondary | ICD-10-CM | POA: Diagnosis not present

## 2023-02-26 DIAGNOSIS — M546 Pain in thoracic spine: Secondary | ICD-10-CM | POA: Diagnosis not present

## 2023-02-28 ENCOUNTER — Encounter: Payer: Self-pay | Admitting: Hematology

## 2023-03-04 ENCOUNTER — Encounter: Payer: Self-pay | Admitting: Hematology

## 2023-03-05 DIAGNOSIS — R269 Unspecified abnormalities of gait and mobility: Secondary | ICD-10-CM | POA: Diagnosis not present

## 2023-03-05 DIAGNOSIS — M6281 Muscle weakness (generalized): Secondary | ICD-10-CM | POA: Diagnosis not present

## 2023-03-05 DIAGNOSIS — M546 Pain in thoracic spine: Secondary | ICD-10-CM | POA: Diagnosis not present

## 2023-03-11 ENCOUNTER — Encounter (HOSPITAL_COMMUNITY): Payer: Self-pay

## 2023-03-11 ENCOUNTER — Ambulatory Visit (HOSPITAL_COMMUNITY)
Admission: RE | Admit: 2023-03-11 | Discharge: 2023-03-11 | Disposition: A | Discharge status: Home / Self Care | Payer: Medicare Other | Source: Ambulatory Visit | Attending: Hematology | Admitting: Hematology

## 2023-03-11 DIAGNOSIS — C8338 Diffuse large B-cell lymphoma, lymph nodes of multiple sites: Secondary | ICD-10-CM | POA: Insufficient documentation

## 2023-03-11 DIAGNOSIS — K8020 Calculus of gallbladder without cholecystitis without obstruction: Secondary | ICD-10-CM | POA: Diagnosis not present

## 2023-03-11 DIAGNOSIS — C8510 Unspecified B-cell lymphoma, unspecified site: Secondary | ICD-10-CM | POA: Diagnosis not present

## 2023-03-11 DIAGNOSIS — R590 Localized enlarged lymph nodes: Secondary | ICD-10-CM | POA: Diagnosis not present

## 2023-03-11 MED ORDER — IOHEXOL 300 MG/ML  SOLN
30.0000 mL | Freq: Once | INTRAMUSCULAR | Status: AC | PRN
  Administered 2023-03-11: 30 mL via ORAL

## 2023-03-11 MED ORDER — IOHEXOL 300 MG/ML  SOLN
100.0000 mL | Freq: Once | INTRAMUSCULAR | Status: AC | PRN
  Administered 2023-03-11: 100 mL via INTRAVENOUS

## 2023-03-12 ENCOUNTER — Ambulatory Visit: Payer: BC Managed Care – PPO | Admitting: Hematology

## 2023-03-12 ENCOUNTER — Other Ambulatory Visit: Payer: BC Managed Care – PPO

## 2023-03-12 DIAGNOSIS — H353112 Nonexudative age-related macular degeneration, right eye, intermediate dry stage: Secondary | ICD-10-CM | POA: Diagnosis not present

## 2023-03-19 ENCOUNTER — Other Ambulatory Visit: Payer: Self-pay | Admitting: *Deleted

## 2023-03-19 ENCOUNTER — Inpatient Hospital Stay: Payer: Medicare Other | Attending: Hematology

## 2023-03-19 ENCOUNTER — Other Ambulatory Visit: Payer: Self-pay | Admitting: Medical Genetics

## 2023-03-19 ENCOUNTER — Inpatient Hospital Stay: Payer: Medicare Other | Admitting: Hematology

## 2023-03-19 VITALS — BP 142/71 | HR 71 | Temp 98.1°F | Resp 17 | Wt 213.3 lb

## 2023-03-19 DIAGNOSIS — Z803 Family history of malignant neoplasm of breast: Secondary | ICD-10-CM | POA: Insufficient documentation

## 2023-03-19 DIAGNOSIS — Z006 Encounter for examination for normal comparison and control in clinical research program: Secondary | ICD-10-CM

## 2023-03-19 DIAGNOSIS — C8338 Diffuse large B-cell lymphoma, lymph nodes of multiple sites: Secondary | ICD-10-CM

## 2023-03-19 DIAGNOSIS — C8330 Diffuse large B-cell lymphoma, unspecified site: Secondary | ICD-10-CM | POA: Insufficient documentation

## 2023-03-19 DIAGNOSIS — Z79899 Other long term (current) drug therapy: Secondary | ICD-10-CM | POA: Insufficient documentation

## 2023-03-19 LAB — CMP (CANCER CENTER ONLY)
ALT: 19 U/L (ref 0–44)
AST: 18 U/L (ref 15–41)
Albumin: 4.1 g/dL (ref 3.5–5.0)
Alkaline Phosphatase: 94 U/L (ref 38–126)
Anion gap: 4 — ABNORMAL LOW (ref 5–15)
BUN: 15 mg/dL (ref 8–23)
CO2: 30 mmol/L (ref 22–32)
Calcium: 8.9 mg/dL (ref 8.9–10.3)
Chloride: 109 mmol/L (ref 98–111)
Creatinine: 0.78 mg/dL (ref 0.44–1.00)
GFR, Estimated: >60 mL/min (ref >60)
Glucose, Bld: 106 mg/dL — ABNORMAL HIGH (ref 70–99)
Potassium: 3.9 mmol/L (ref 3.5–5.1)
Sodium: 143 mmol/L (ref 135–145)
Total Bilirubin: 0.5 mg/dL (ref 0.0–1.2)
Total Protein: 6.4 g/dL — ABNORMAL LOW (ref 6.5–8.1)

## 2023-03-19 LAB — CBC WITH DIFFERENTIAL (CANCER CENTER ONLY)
Abs Immature Granulocytes: 0.01 10*3/uL (ref 0.00–0.07)
Basophils Absolute: 0 10*3/uL (ref 0.0–0.1)
Basophils Relative: 1 %
Eosinophils Absolute: 0.2 10*3/uL (ref 0.0–0.5)
Eosinophils Relative: 3 %
HCT: 38.8 % (ref 36.0–46.0)
Hemoglobin: 13.3 g/dL (ref 12.0–15.0)
Immature Granulocytes: 0 %
Lymphocytes Relative: 40 %
Lymphs Abs: 2 10*3/uL (ref 0.7–4.0)
MCH: 30.5 pg (ref 26.0–34.0)
MCHC: 34.3 g/dL (ref 30.0–36.0)
MCV: 89 fL (ref 80.0–100.0)
Monocytes Absolute: 0.4 10*3/uL (ref 0.1–1.0)
Monocytes Relative: 8 %
Neutro Abs: 2.4 10*3/uL (ref 1.7–7.7)
Neutrophils Relative %: 48 %
Platelet Count: 233 10*3/uL (ref 150–400)
RBC: 4.36 MIL/uL (ref 3.87–5.11)
RDW: 13.4 % (ref 11.5–15.5)
WBC Count: 4.9 10*3/uL (ref 4.0–10.5)
nRBC: 0 % (ref 0.0–0.2)

## 2023-03-19 LAB — LACTATE DEHYDROGENASE: LDH: 159 U/L (ref 98–192)

## 2023-03-19 NOTE — Progress Notes (Signed)
 New order requested. Confirmed consent on file.

## 2023-03-19 NOTE — Progress Notes (Signed)
 HEMATOLOGY/ONCOLOGY CLINIC NOTE  Date of Service: 03/19/23   Patient Care Team: Sheela Stack as PCP - General (Physician Assistant)  CHIEF COMPLAINTS/PURPOSE OF CONSULTATION:  Follow-up for continued evaluation and management of diffuse large B-cell lymphoma  HISTORY OF PRESENTING ILLNESS:  Please see previous note for details on initial presentation  INTERVAL HISTORY:  Barbara Rhodes is a 68 y.o. female is connected via phone for continued evaluation of diffuse large B-cell lymphoma.  Patient was last seen by me on 12/04/2022 and she was doing well overall.   Patient notes she has been doing well overall since our last visit. She denies any new infection issues, fever, chills, night sweats, unexpected weight loss, chest pain, abdominal pain, or leg swelling. She does complain of chronic back pain and bilateral leg pain.   She has discontinued Maxzide. She does check her blood pressure at home, which have been in the normal range without her medication.     MEDICAL HISTORY:  Past Medical History:  Diagnosis Date   Arthritis    Breast cancer (HCC) 2000   Stage 2B, ER+/PR-/Her2-   Colon polyps    Diffuse large B cell lymphoma (HCC) 05/2021   Family history of adverse reaction to anesthesia 04-01-57   Brother passed away at 32 years old from malignant hyperthermia.   Family history of breast cancer    Family history of prostate cancer    Hypertension     SURGICAL HISTORY: Past Surgical History:  Procedure Laterality Date   ABDOMINAL HYSTERECTOMY     BACK SURGERY     BREAST SURGERY     COLONOSCOPY WITH PROPOFOL     pt said propofol burned her throat when pushed   IR US GUIDE BX ASP/DRAIN  04/03/2021   LYMPH NODE BIOPSY Left 06/02/2021   Procedure: EXCISIONAL CERVICAL LYMPH NODE BIOPSY;  Surgeon: Griselda Miner, MD;  Location: [facility];  Service: General;  Laterality: Left;   MASTECTOMY Right 2000   MASTECTOMY Right 2000   PORT-A-CATH REMOVAL N/A  12/08/2021   Procedure: REMOVAL PORT-A-CATH;  Surgeon: Griselda Miner, MD;  Location: [facility] OR;  Service: General;  Laterality: N/A;   PORTACATH PLACEMENT Right 06/02/2021   Procedure: INSERTION PORT-A-CATH;  Surgeon: Griselda Miner, MD;  Location: [facility];  Service: General;  Laterality: Right;   POSTERIOR CERVICAL LAMINECTOMY Left 01/23/2018   Procedure: LEFT CERVICAL SEVEN- THORACIC ONE LAMINOTOMY,FORAMINOTOMY, MICRODISCECTOMY;  Surgeon: Shirlean Kelly, MD;  Location: [facility] OR;  Service: Neurosurgery;  Laterality: Left;   ROTATOR CUFF REPAIR     TONSILLECTOMY     TRANSFORAMINAL LUMBAR INTERBODY FUSION (TLIF) WITH PEDICLE SCREW FIXATION 2 LEVEL Right 10/30/2019   Procedure: Right Lumbar 3-4 Lumbar 4-5 Transforaminal lumbar interbody fusion;  Surgeon: Maeola Harman, MD;  Location: [facility] OR;  Service: Neurosurgery;  Laterality: Right;  3C/RM 21   TUBAL LIGATION     WISDOM TOOTH EXTRACTION      SOCIAL HISTORY: Social History   Socioeconomic History   Marital status: Married    Spouse name: Not on file   Number of children: Not on file   Years of education: Not on file   Highest education level: Not on file  Occupational History   Not on file  Tobacco Use   Smoking status: Never   Smokeless tobacco: Never  Vaping Use   Vaping status: Never Used  Substance and Sexual Activity   Alcohol use: No   Drug use: No   Sexual activity:  Not on file  Other Topics Concern   Not on file  Social History Narrative   Not on file   Social Drivers of Health   Financial Resource Strain: Not on file  Food Insecurity: Not on file  Transportation Needs: Not on file  Physical Activity: Not on file  Stress: Not on file  Social Connections: Not on file  Intimate Partner Violence: Not on file    FAMILY HISTORY: Family History  Problem Relation Age of Onset   Breast cancer Paternal Grandmother 53   Prostate cancer Father        dx in his 70s   Colon polyps Father 18   Dementia Mother    Hypertension  Mother    Dementia Maternal Aunt    Stroke Maternal Grandfather    Rheum arthritis Paternal Grandfather    Breast cancer Other        MGM's sister, post-menopausal breast cancer   Breast cancer Cousin 52       paternal 2nd cousin    ALLERGIES:  is allergic to penicillins and rituximab-pvvr.  MEDICATIONS:  Current Outpatient Medications  Medication Sig Dispense Refill   acetaminophen (TYLENOL) 500 MG tablet Take 1,000 mg by mouth daily as needed for mild pain or moderate pain.     B Complex-C (B-COMPLEX WITH VITAMIN C) tablet Take 1 tablet by mouth daily.     baclofen (LIORESAL) 10 MG tablet Take 10 mg by mouth daily.     calcium carbonate (OSCAL) 1500 (600 Ca) MG TABS tablet Take 600 mg of elemental calcium by mouth daily.     calcium citrate (CALCITRATE - DOSED IN MG ELEMENTAL CALCIUM) 950 (200 Ca) MG tablet Take 200 mg of elemental calcium by mouth daily.     celecoxib (CELEBREX) 100 MG capsule Take 100 mg by mouth daily.     Cholecalciferol (VITAMIN D3) 50 MCG (2000 UT) capsule Take 4,000 Units by mouth daily.     docusate sodium (COLACE) 100 MG capsule Take 100 mg by mouth daily as needed for mild constipation.     Magnesium 400 MG TABS Take 400 mg by mouth daily.     Omega-3 Fatty Acids (FISH OIL ULTRA) 1400 MG CAPS Take 1,400 mg by mouth daily.     Potassium 99 MG TABS Take 99 mg by mouth daily.     triamterene-hydrochlorothiazide (MAXZIDE-25) 37.5-25 MG tablet Take 1 tablet by mouth daily.  (Patient not taking: Reported on 12/04/2022)     vitamin B-12 (CYANOCOBALAMIN) 100 MCG tablet Take 100 mcg by mouth daily.     No current facility-administered medications for this visit.    REVIEW OF SYSTEMS:   10 Point review of Systems was done is negative except as noted above.   PHYSICAL EXAMINATION:  .BP (!) 142/71 Comment: Nurse notified  Pulse 71   Temp 98.1 F (36.7 C) (Temporal)   Resp 17   Wt 213 lb 4.8 oz (96.8 kg)   SpO2 98%   BMI 37.19 kg/m  . GENERAL:alert, in  no acute distress and comfortable SKIN: no acute rashes, no significant lesions EYES: conjunctiva are pink and non-injected, sclera anicteric OROPHARYNX: MMM, no exudates, no oropharyngeal erythema or ulceration NECK: supple, no JVD LYMPH:  no palpable lymphadenopathy in the cervical, axillary or inguinal regions LUNGS: clear to auscultation b/l with normal respiratory effort HEART: regular rate & rhythm ABDOMEN:  normoactive bowel sounds , non tender, not distended. Extremity: no pedal edema PSYCH: alert & oriented x 3 with fluent  speech NEURO: no focal motor/sensory deficits   LABORATORY DATA:  I have reviewed the data as listed    Latest Ref Rng & Units 03/19/2023    8:45 AM 12/04/2022    8:49 AM 08/14/2022    8:37 AM  CBC  WBC 4.0 - 10.5 K/uL 4.9  4.7  4.3   Hemoglobin 12.0 - 15.0 g/dL 84.6  96.2  95.2   Hematocrit 36.0 - 46.0 % 38.8  39.7  39.5   Platelets 150 - 400 K/uL 233  227  235       Latest Ref Rng & Units 12/04/2022    8:49 AM 08/14/2022    8:37 AM 05/08/2022    8:43 AM  CMP  Glucose 70 - 99 mg/dL 841  99  324   BUN 8 - 23 mg/dL 17  12  19    Creatinine 0.44 - 1.00 mg/dL 4.01  0.27  2.53   Sodium 135 - 145 mmol/L 142  143  141   Potassium 3.5 - 5.1 mmol/L 3.8  4.4  3.5   Chloride 98 - 111 mmol/L 104  107  103   CO2 22 - 32 mmol/L 32  32  30   Calcium 8.9 - 10.3 mg/dL 9.4  9.5  9.7   Total Protein 6.5 - 8.1 g/dL 6.6  6.3  7.0   Total Bilirubin <1.2 mg/dL 0.7  0.6  0.7   Alkaline Phos 38 - 126 U/L 97  78  79   AST 15 - 41 U/L 20  17  18    ALT 0 - 44 U/L 20  15  18     Lab Results  Component Value Date   LDH 174 12/04/2022    SURGICAL PATHOLOGY 04/03/2021 FINAL MICROSCOPIC DIAGNOSIS:  A. LYMPH NODE, LEFT SUPRACLAVICULAR, NEEDLE CORE BIOPSY:  -Atypical lymphoid proliferation  -See comment   COMMENT:  The sections show several very small needle core biopsy fragments of  apparently lymph nodal tissue displaying an atypical lymphoid  proliferation of  primarily large lymphoid appearing cells characterized  by vesicular chromatin and prominent nucleoli associated with scattered  mitosis.  The appearance is apparently diffuse with lack of atypical  follicles.  Flow cytometric analysis was performed [facility] [id number]) and  shows predominance of T lymphocytes with nonspecific changes.  No  monoclonal B-cell population identified.  A battery of  immunohistochemical stains was performed but the tissue is prominently  exhausted on deeper sectioning.  Scattered residual very minute  fragments show that the lymphoid appearing cells are positive for LCA,  CD20, CD79a, BCL6, MUM1, and Bcl-2.  No significant staining is seen  with CD138, CD10, CD34, cyclin D1, S100, cytokeratin AE1/AE3,  cytokeratin 8/18, or GATA3.  There is an admixed T-cell population to a  lesser extent as seen with CD3 and CD5 and there is no apparent  co-expression of CD5 in B-cell areas.  The following stains were  performed but there is no optimal tissue present on deeper sectioning  for evaluation including CD30, TdT, and in situ hybridization for kappa,  lambda and EBV.  The overall findings are limited but very concerning  for involvement by a B-cell lymphoproliferative process particularly  large cell lymphoma.  Excisional biopsy is recommended to further  evaluate this process.   SURGICAL PATHOLOGY 06/02/2021 FINAL MICROSCOPIC DIAGNOSIS:  A. LYMPH NODE, LEFT NECK, EXCISION:  -  Diffuse large B-cell lymphoma, activated B-cell subtype  -  See comment   COMMENT:  The excisional biopsy consists  of an enlarged lymph node with complete  effacement by a large pleomorphic population of lymphoid cells with  vesicular chromatin and prominent nucleoli.  By immunohistochemistry,  the atypical lymphocytes are positive for CD20, PAX5, Bcl-2, BCL6 and  Mum-1.  The cells are negative for CD5, CD10, CD30 and EBV by in situ  hybridization.  The proliferative rate by Ki-67 is 60-70%.  CD3   highlights background benign-appearing lymphocytes.  Overall, the  findings are consistent with a diffuse large B cell lymphoma, activated  B-cell phenotype.  Preliminary results of this case were discussed with  Dr. Lance Bosch on Jun 06, 2021.  FISH studies (high-grade/large B-cell  lymphoma) are pending and will be reported in an addendum.     RADIOGRAPHIC STUDIES: I have personally reviewed the radiological images as listed and agreed with the findings in the report. CT CHEST ABDOMEN PELVIS W CONTRAST Result Date: 03/12/2023 CLINICAL DATA:  Large B-cell lymphoma, monitor. Also history of breast cancer. * Tracking Code: BO * EXAM: CT CHEST, ABDOMEN, AND PELVIS WITH CONTRAST TECHNIQUE: Multidetector CT imaging of the chest, abdomen and pelvis was performed following the standard protocol during bolus administration of intravenous contrast. RADIATION DOSE REDUCTION: This exam was performed according to the departmental dose-optimization program which includes automated exposure control, adjustment of the mA and/or kV according to patient size and/or use of iterative reconstruction technique. CONTRAST:  30mL OMNIPAQUE IOHEXOL 300 MG/ML SOLN, OMNIPAQUE IOHEXOL 300 MG/ML SOLN COMPARISON:  Multiple priors including PET-CT November 01, 2021 FINDINGS: CT CHEST FINDINGS Cardiovascular: Aortic atherosclerosis. No central pulmonary embolus on this nondedicated study. Normal size heart. No significant pericardial effusion/thickening. Mediastinum/Nodes: No suspicious thyroid nodule. Left low cervical/supraclavicular lymph nodes measure up to 9 mm in short axis on image 9/2, unchanged. No pathologically enlarged mediastinal, hilar or axillary lymph nodes. Surgical clips in the right axilla. Lungs/Pleura: Biapical pleuroparenchymal scarring. Adjacent right middle lobe pulmonary nodules measure 3 mm on image 92/6 unchanged from prior and considered benign. No new suspicious pulmonary nodules or masses. No pleural  effusion. No pneumothorax. Musculoskeletal: No aggressive lytic or blastic lesion of bone. Multilevel degenerative change of the spine. Prior right mastectomy. CT ABDOMEN PELVIS FINDINGS Hepatobiliary: Tiny hepatic hypodensities technically too small to accurately characterize but favored benign. Cholelithiasis without findings of acute cholecystitis. Pancreas: No pancreatic ductal dilation or evidence of acute inflammation. Spleen: No splenomegaly. Adrenals/Urinary Tract: Bilateral adrenal glands appear normal. No hydronephrosis. Kidneys demonstrate symmetric enhancement. Urinary bladder is unremarkable for degree of distension. Stomach/Bowel: Stomach is within normal limits. Appendix appears normal. No evidence of bowel wall thickening, distention, or inflammatory changes. Scattered colonic diverticulosis without findings of acute diverticulitis. Vascular/Lymphatic: No significant vascular findings are present. No pathologically enlarged abdominal or pelvic lymph nodes. Reproductive: Uterus and bilateral adnexa are unremarkable. Other: Tiny fat containing umbilical hernia. Musculoskeletal: No aggressive lytic or blastic lesion of bone. L3-L5 posterior spinal fusion hardware with interbody disc spacers. Advanced degenerative change at L1-L2 and L2-L3. IMPRESSION: 1. No pathologically enlarged lymph nodes in the chest, abdomen or pelvis. Stable prominent left supraclavicular lymph nodes. 2. No splenomegaly. 3. Cholelithiasis without findings of acute cholecystitis. 4. Scattered colonic diverticulosis without findings of acute diverticulitis. Electronically Signed   By: Maudry Mayhew M.D.   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 previous history of right-sided breast cancer status post right mastectomy and adjuvant chemotherapy including doxorubicin now with:  #1 stage III/IV diffuse large B-cell lymphoma.-Activated B-cell subtype  High risk lymphoma FISH panel no evidence of  double or triple hit lymphoma #2 anemia due to lymphoma involvement of bone marrow #3 status post Port-A-Cath placement #4 thrombocytopenia secondary to #1 #5 high risk of tumor lysis syndrome #6 anxiety #7 remote history of right breast cancer status postmastectomy and adjuvant chemotherapy #8 rigors with Rituxan dose being At 200 mg/h  Plan: -Discussed lab results from today, 03/19/2023, in detail with the patient. CBC stable. CMP stable. -Discussed CT Chest/Abdomen/Pelvis results from 03/11/2023 in detail with the patient. Showed No pathologically enlarged lymph nodes in the chest, abdomen or pelvis. Stable prominent left supraclavicular lymph nodes. No splenomegaly. Cholelithiasis without findings of acute cholecystitis. Scattered colonic diverticulosis without findings of acute diverticulitis. -Answered all of patient's questions.  -Recommend influenza vaccine, COVID-19 Booster, RSV vaccine, and other age related vaccines.  - Patient has no lab or clinical evidence of recurrence/progression of DLBCL at this time.  Follow-up: RTC with Dr Candise Che with labs in 3 months  The total time spent in the appointment was 20 minutes* .  All of the patient's questions were answered with apparent satisfaction. The patient knows to call the clinic with any problems, questions or concerns.   Wyvonnia Lora MD MS AAHIVMS [facility] ([facility] Hematology/Oncology Physician [facility]  .*Total Encounter Time as defined by the Centers for Medicare and Medicaid Services includes, in addition to the face-to-face time of a patient visit (documented in the note above) non-face-to-face time: obtaining and reviewing outside history, ordering and reviewing medications, tests or procedures, care coordination (communications with other health care professionals or caregivers) and documentation in the medical record.   I,Param Shah,acting as a Neurosurgeon for Wyvonnia Lora, MD.,have documented all relevant documentation  on the behalf of Wyvonnia Lora, MD,as directed by  Wyvonnia Lora, MD while in the presence of Wyvonnia Lora, MD.  .I have reviewed the above documentation for accuracy and completeness, and I agree with the above. Johney Maine MD

## 2023-03-20 ENCOUNTER — Telehealth: Payer: Self-pay | Admitting: Hematology

## 2023-03-20 NOTE — Telephone Encounter (Signed)
 Spoke with patient confirming upcoming appointment

## 2023-03-25 ENCOUNTER — Encounter: Payer: Self-pay | Admitting: Hematology

## 2023-03-26 ENCOUNTER — Other Ambulatory Visit (HOSPITAL_COMMUNITY)
Admission: RE | Admit: 2023-03-26 | Discharge: 2023-03-26 | Disposition: A | Discharge status: Home / Self Care | Payer: Self-pay | Source: Ambulatory Visit | Attending: Medical Genetics | Admitting: Medical Genetics

## 2023-03-26 DIAGNOSIS — M546 Pain in thoracic spine: Secondary | ICD-10-CM | POA: Diagnosis not present

## 2023-03-26 DIAGNOSIS — Z006 Encounter for examination for normal comparison and control in clinical research program: Secondary | ICD-10-CM | POA: Insufficient documentation

## 2023-03-26 DIAGNOSIS — R269 Unspecified abnormalities of gait and mobility: Secondary | ICD-10-CM | POA: Diagnosis not present

## 2023-03-26 DIAGNOSIS — M6281 Muscle weakness (generalized): Secondary | ICD-10-CM | POA: Diagnosis not present

## 2023-04-10 DIAGNOSIS — M6281 Muscle weakness (generalized): Secondary | ICD-10-CM | POA: Diagnosis not present

## 2023-04-10 DIAGNOSIS — R269 Unspecified abnormalities of gait and mobility: Secondary | ICD-10-CM | POA: Diagnosis not present

## 2023-04-10 DIAGNOSIS — M546 Pain in thoracic spine: Secondary | ICD-10-CM | POA: Diagnosis not present

## 2023-04-24 DIAGNOSIS — M21371 Foot drop, right foot: Secondary | ICD-10-CM | POA: Diagnosis not present

## 2023-04-24 DIAGNOSIS — R269 Unspecified abnormalities of gait and mobility: Secondary | ICD-10-CM | POA: Diagnosis not present

## 2023-04-24 DIAGNOSIS — M6281 Muscle weakness (generalized): Secondary | ICD-10-CM | POA: Diagnosis not present

## 2023-04-24 DIAGNOSIS — M546 Pain in thoracic spine: Secondary | ICD-10-CM | POA: Diagnosis not present

## 2023-04-24 DIAGNOSIS — M48061 Spinal stenosis, lumbar region without neurogenic claudication: Secondary | ICD-10-CM | POA: Diagnosis not present

## 2023-04-24 DIAGNOSIS — Z6836 Body mass index (BMI) 36.0-36.9, adult: Secondary | ICD-10-CM | POA: Diagnosis not present

## 2023-05-01 DIAGNOSIS — R269 Unspecified abnormalities of gait and mobility: Secondary | ICD-10-CM | POA: Diagnosis not present

## 2023-05-01 DIAGNOSIS — M546 Pain in thoracic spine: Secondary | ICD-10-CM | POA: Diagnosis not present

## 2023-05-01 DIAGNOSIS — M6281 Muscle weakness (generalized): Secondary | ICD-10-CM | POA: Diagnosis not present

## 2023-05-20 DIAGNOSIS — R269 Unspecified abnormalities of gait and mobility: Secondary | ICD-10-CM | POA: Diagnosis not present

## 2023-05-20 DIAGNOSIS — M6281 Muscle weakness (generalized): Secondary | ICD-10-CM | POA: Diagnosis not present

## 2023-05-20 DIAGNOSIS — M546 Pain in thoracic spine: Secondary | ICD-10-CM | POA: Diagnosis not present

## 2023-05-28 DIAGNOSIS — R269 Unspecified abnormalities of gait and mobility: Secondary | ICD-10-CM | POA: Diagnosis not present

## 2023-05-28 DIAGNOSIS — M6281 Muscle weakness (generalized): Secondary | ICD-10-CM | POA: Diagnosis not present

## 2023-05-28 DIAGNOSIS — M546 Pain in thoracic spine: Secondary | ICD-10-CM | POA: Diagnosis not present

## 2023-06-04 DIAGNOSIS — R269 Unspecified abnormalities of gait and mobility: Secondary | ICD-10-CM | POA: Diagnosis not present

## 2023-06-04 DIAGNOSIS — M6281 Muscle weakness (generalized): Secondary | ICD-10-CM | POA: Diagnosis not present

## 2023-06-04 DIAGNOSIS — M546 Pain in thoracic spine: Secondary | ICD-10-CM | POA: Diagnosis not present

## 2023-06-05 ENCOUNTER — Telehealth: Payer: Self-pay | Admitting: Medical Genetics

## 2023-06-05 DIAGNOSIS — Z1501 Genetic susceptibility to malignant neoplasm of breast: Secondary | ICD-10-CM

## 2023-06-05 LAB — GENECONNECT MOLECULAR SCREEN: Genetic Analysis Overall Interpretation: POSITIVE — AB

## 2023-06-05 NOTE — Telephone Encounter (Signed)
 [facility] Positive Result Note 06/05/2023 8:57 PM  FIRST ATTEMPT: Confirmed I was speaking with Barbara Rhodes [phone] by using name and DOB. Informed participant the reason for this call is to provide results for the above study. Results revealed Hereditary Breast and Ovarian Syndrome. Genetic counseling was offered and participant declined as she had this done previously. All questions were answered, and participant was thanked for their time and support of the above study. Participant was encouraged to contact [facility] if they have any further questions or concerns.

## 2023-06-10 ENCOUNTER — Other Ambulatory Visit: Payer: Self-pay

## 2023-06-10 DIAGNOSIS — C8338 Diffuse large B-cell lymphoma, lymph nodes of multiple sites: Secondary | ICD-10-CM

## 2023-06-10 DIAGNOSIS — K080 Exfoliation of teeth due to systemic causes: Secondary | ICD-10-CM | POA: Diagnosis not present

## 2023-06-11 ENCOUNTER — Telehealth: Payer: Self-pay | Admitting: Hematology

## 2023-06-11 ENCOUNTER — Inpatient Hospital Stay: Payer: Medicare Other

## 2023-06-11 ENCOUNTER — Inpatient Hospital Stay: Payer: Medicare Other | Attending: Hematology | Admitting: Hematology

## 2023-06-11 VITALS — BP 147/80 | HR 65 | Temp 97.3°F | Resp 18 | Wt 210.7 lb

## 2023-06-11 DIAGNOSIS — D6959 Other secondary thrombocytopenia: Secondary | ICD-10-CM | POA: Diagnosis not present

## 2023-06-11 DIAGNOSIS — R6889 Other general symptoms and signs: Secondary | ICD-10-CM | POA: Diagnosis not present

## 2023-06-11 DIAGNOSIS — Z8042 Family history of malignant neoplasm of prostate: Secondary | ICD-10-CM | POA: Diagnosis not present

## 2023-06-11 DIAGNOSIS — Z79899 Other long term (current) drug therapy: Secondary | ICD-10-CM | POA: Insufficient documentation

## 2023-06-11 DIAGNOSIS — Z83719 Family history of colon polyps, unspecified: Secondary | ICD-10-CM | POA: Insufficient documentation

## 2023-06-11 DIAGNOSIS — C8330 Diffuse large B-cell lymphoma, unspecified site: Secondary | ICD-10-CM | POA: Insufficient documentation

## 2023-06-11 DIAGNOSIS — Z9221 Personal history of antineoplastic chemotherapy: Secondary | ICD-10-CM | POA: Diagnosis not present

## 2023-06-11 DIAGNOSIS — C8338 Diffuse large B-cell lymphoma, lymph nodes of multiple sites: Secondary | ICD-10-CM | POA: Diagnosis not present

## 2023-06-11 DIAGNOSIS — Z9011 Acquired absence of right breast and nipple: Secondary | ICD-10-CM | POA: Insufficient documentation

## 2023-06-11 DIAGNOSIS — D630 Anemia in neoplastic disease: Secondary | ICD-10-CM | POA: Insufficient documentation

## 2023-06-11 DIAGNOSIS — Z853 Personal history of malignant neoplasm of breast: Secondary | ICD-10-CM | POA: Diagnosis not present

## 2023-06-11 DIAGNOSIS — F419 Anxiety disorder, unspecified: Secondary | ICD-10-CM | POA: Diagnosis not present

## 2023-06-11 LAB — CMP (CANCER CENTER ONLY)
ALT: 23 U/L (ref 0–44)
AST: 20 U/L (ref 15–41)
Albumin: 4.4 g/dL (ref 3.5–5.0)
Alkaline Phosphatase: 95 U/L (ref 38–126)
Anion gap: 6 (ref 5–15)
BUN: 15 mg/dL (ref 8–23)
CO2: 30 mmol/L (ref 22–32)
Calcium: 9.2 mg/dL (ref 8.9–10.3)
Chloride: 106 mmol/L (ref 98–111)
Creatinine: 0.72 mg/dL (ref 0.44–1.00)
GFR, Estimated: >60 mL/min (ref >60)
Glucose, Bld: 98 mg/dL (ref 70–99)
Potassium: 4 mmol/L (ref 3.5–5.1)
Sodium: 142 mmol/L (ref 135–145)
Total Bilirubin: 0.5 mg/dL (ref 0.0–1.2)
Total Protein: 6.9 g/dL (ref 6.5–8.1)

## 2023-06-11 LAB — CBC WITH DIFFERENTIAL (CANCER CENTER ONLY)
Abs Immature Granulocytes: 0.02 10*3/uL (ref 0.00–0.07)
Basophils Absolute: 0 10*3/uL (ref 0.0–0.1)
Basophils Relative: 0 %
Eosinophils Absolute: 0.2 10*3/uL (ref 0.0–0.5)
Eosinophils Relative: 3 %
HCT: 40.9 % (ref 36.0–46.0)
Hemoglobin: 13.7 g/dL (ref 12.0–15.0)
Immature Granulocytes: 0 %
Lymphocytes Relative: 45 %
Lymphs Abs: 2.4 10*3/uL (ref 0.7–4.0)
MCH: 30.2 pg (ref 26.0–34.0)
MCHC: 33.5 g/dL (ref 30.0–36.0)
MCV: 90.3 fL (ref 80.0–100.0)
Monocytes Absolute: 0.4 10*3/uL (ref 0.1–1.0)
Monocytes Relative: 8 %
Neutro Abs: 2.4 10*3/uL (ref 1.7–7.7)
Neutrophils Relative %: 44 %
Platelet Count: 228 10*3/uL (ref 150–400)
RBC: 4.53 MIL/uL (ref 3.87–5.11)
RDW: 13.5 % (ref 11.5–15.5)
WBC Count: 5.4 10*3/uL (ref 4.0–10.5)
nRBC: 0 % (ref 0.0–0.2)

## 2023-06-11 LAB — LACTATE DEHYDROGENASE: LDH: 165 U/L (ref 98–192)

## 2023-06-11 NOTE — Progress Notes (Signed)
 HEMATOLOGY/ONCOLOGY CLINIC NOTE  Date of Service: 06/11/23   Patient Care Team: Skillman, Katherine E, PA-C as PCP - General (Physician Assistant)  CHIEF COMPLAINTS/PURPOSE OF CONSULTATION:  Follow-up for continued evaluation and management of diffuse large B-cell lymphoma  HISTORY OF PRESENTING ILLNESS:  Please see previous note for details on initial presentation  INTERVAL HISTORY:  Barbara Rhodes is a 68 y.o. female here for continued evaluation of diffuse large B-cell lymphoma.  Patient was last seen by me on 03/19/2023 and complained of chronic back pain and bilateral leg pain.   Patient notes no acute new symptoms since her last clinic visit.  There are some neuropathy symptoms. No fevers no chills no night sweats no unexpected weight loss. No new lumps or bumps. Labs done today were reviewed in details with her.  MEDICAL HISTORY:  Past Medical History:  Diagnosis Date   Arthritis    Breast cancer (HCC) 2000   Stage 2B, ER+/PR-/Her2-   Colon polyps    Diffuse large B cell lymphoma (HCC) 05/2021   Family history of adverse reaction to anesthesia 07/18/1957   Brother passed away at 81 years old from malignant hyperthermia.   Family history of breast cancer    Family history of prostate cancer    Hypertension     SURGICAL HISTORY: Past Surgical History:  Procedure Laterality Date   ABDOMINAL HYSTERECTOMY     BACK SURGERY     BREAST SURGERY     COLONOSCOPY WITH PROPOFOL      pt said propofol  burned her throat when pushed   IR US  GUIDE BX ASP/DRAIN  04/03/2021   LYMPH NODE BIOPSY Left 06/02/2021   Procedure: EXCISIONAL CERVICAL LYMPH NODE BIOPSY;  Surgeon: Caralyn Chandler, MD;  Location: [facility] ORS;  Service: General;  Laterality: Left;   MASTECTOMY Right 2000   MASTECTOMY Right 2000   PORT-A-CATH REMOVAL N/A 12/08/2021   Procedure: REMOVAL PORT-A-CATH;  Surgeon: Caralyn Chandler, MD;  Location: [facility] OR;  Service: General;  Laterality: N/A;   PORTACATH PLACEMENT  Right 06/02/2021   Procedure: INSERTION PORT-A-CATH;  Surgeon: Caralyn Chandler, MD;  Location: [facility];  Service: General;  Laterality: Right;   POSTERIOR CERVICAL LAMINECTOMY Left 01/23/2018   Procedure: LEFT CERVICAL SEVEN- THORACIC ONE LAMINOTOMY,FORAMINOTOMY, MICRODISCECTOMY;  Surgeon: Yvonna Herder, MD;  Location: [facility] OR;  Service: Neurosurgery;  Laterality: Left;   ROTATOR CUFF REPAIR     TONSILLECTOMY     TRANSFORAMINAL LUMBAR INTERBODY FUSION (TLIF) WITH PEDICLE SCREW FIXATION 2 LEVEL Right 10/30/2019   Procedure: Right Lumbar 3-4 Lumbar 4-5 Transforaminal lumbar interbody fusion;  Surgeon: Manya Sells, MD;  Location: [facility] OR;  Service: Neurosurgery;  Laterality: Right;  3C/RM 21   TUBAL LIGATION     WISDOM TOOTH EXTRACTION      SOCIAL HISTORY: Social History   Socioeconomic History   Marital status: Married    Spouse name: Not on file   Number of children: Not on file   Years of education: Not on file   Highest education level: Not on file  Occupational History   Not on file  Tobacco Use   Smoking status: Never   Smokeless tobacco: Never  Vaping Use   Vaping status: Never Used  Substance and Sexual Activity   Alcohol use: No   Drug use: No   Sexual activity: Not on file  Other Topics Concern   Not on file  Social History Narrative   Not on file   Social Drivers of Health  Financial Resource Strain: Not on file  Food Insecurity: Not on file  Transportation Needs: Not on file  Physical Activity: Not on file  Stress: Not on file  Social Connections: Not on file  Intimate Partner Violence: Not on file    FAMILY HISTORY: Family History  Problem Relation Age of Onset   Breast cancer Paternal Grandmother 63   Prostate cancer Father        dx in his 69s   Colon polyps Father 71   Dementia Mother    Hypertension Mother    Dementia Maternal Aunt    Stroke Maternal Grandfather    Rheum arthritis Paternal Grandfather    Breast cancer Other        MGM's sister,  post-menopausal breast cancer   Breast cancer Cousin 20       paternal 2nd cousin    ALLERGIES:  is allergic to penicillins and rituximab -pvvr.  MEDICATIONS:  Current Outpatient Medications  Medication Sig Dispense Refill   acetaminophen  (TYLENOL ) 500 MG tablet Take 1,000 mg by mouth daily as needed for mild pain or moderate pain.     B Complex-C (B-COMPLEX WITH VITAMIN C) tablet Take 1 tablet by mouth daily.     baclofen (LIORESAL) 10 MG tablet Take 10 mg by mouth daily.     calcium  carbonate (OSCAL) 1500 (600 Ca) MG TABS tablet Take 600 mg of elemental calcium  by mouth daily.     calcium  citrate (CALCITRATE - DOSED IN MG ELEMENTAL CALCIUM ) 950 (200 Ca) MG tablet Take 200 mg of elemental calcium  by mouth daily.     celecoxib  (CELEBREX ) 100 MG capsule Take 100 mg by mouth daily.     Cholecalciferol  (VITAMIN D3) 50 MCG (2000 UT) capsule Take 4,000 Units by mouth daily.     docusate sodium  (COLACE) 100 MG capsule Take 100 mg by mouth daily as needed for mild constipation.     Magnesium  400 MG TABS Take 400 mg by mouth daily.     Omega-3 Fatty Acids (FISH OIL  ULTRA) 1400 MG CAPS Take 1,400 mg by mouth daily.     Potassium 99 MG TABS Take 99 mg by mouth daily.     triamterene -hydrochlorothiazide  (MAXZIDE -25) 37.5-25 MG tablet Take 1 tablet by mouth daily.  (Patient not taking: Reported on 12/04/2022)     vitamin B-12 (CYANOCOBALAMIN) 100 MCG tablet Take 100 mcg by mouth daily.     No current facility-administered medications for this visit.    REVIEW OF SYSTEMS:   10 Point review of Systems was done is negative except as noted above.   PHYSICAL EXAMINATION:  .BP (!) 147/80   Pulse 65   Temp (!) 97.3 F (36.3 C)   Resp 18   Wt 210 lb 11.2 oz (95.6 kg)   SpO2 98%   BMI 36.74 kg/m  GENERAL:alert, in no acute distress and comfortable SKIN: no acute rashes, no significant lesions EYES: conjunctiva are pink and non-injected, sclera anicteric OROPHARYNX: MMM, no exudates, no  oropharyngeal erythema or ulceration NECK: supple, no JVD LYMPH:  no palpable lymphadenopathy in the cervical, axillary or inguinal regions LUNGS: clear to auscultation b/l with normal respiratory effort HEART: regular rate & rhythm ABDOMEN:  normoactive bowel sounds , non tender, not distended. Extremity: no pedal edema PSYCH: alert & oriented x 3 with fluent speech NEURO: no focal motor/sensory deficits    LABORATORY DATA:  I have reviewed the data as listed    Latest Ref Rng & Units 06/11/2023    8:31  AM 03/19/2023    8:45 AM 12/04/2022    8:49 AM  CBC  WBC 4.0 - 10.5 K/uL 5.4  4.9  4.7   Hemoglobin 12.0 - 15.0 g/dL 09.8  11.9  14.7   Hematocrit 36.0 - 46.0 % 40.9  38.8  39.7   Platelets 150 - 400 K/uL 228  233  227       Latest Ref Rng & Units 06/11/2023    8:31 AM 03/19/2023    8:45 AM 12/04/2022    8:49 AM  CMP  Glucose 70 - 99 mg/dL 98  829  562   BUN 8 - 23 mg/dL 15  15  17    Creatinine 0.44 - 1.00 mg/dL 1.30  8.65  7.84   Sodium 135 - 145 mmol/L 142  143  142   Potassium 3.5 - 5.1 mmol/L 4.0  3.9  3.8   Chloride 98 - 111 mmol/L 106  109  104   CO2 22 - 32 mmol/L 30  30  32   Calcium  8.9 - 10.3 mg/dL 9.2  8.9  9.4   Total Protein 6.5 - 8.1 g/dL 6.9  6.4  6.6   Total Bilirubin 0.0 - 1.2 mg/dL 0.5  0.5  0.7   Alkaline Phos 38 - 126 U/L 95  94  97   AST 15 - 41 U/L 20  18  20    ALT 0 - 44 U/L 23  19  20     Lab Results  Component Value Date   LDH 159 03/19/2023    SURGICAL PATHOLOGY 04/03/2021 FINAL MICROSCOPIC DIAGNOSIS:  A. LYMPH NODE, LEFT SUPRACLAVICULAR, NEEDLE CORE BIOPSY:  -Atypical lymphoid proliferation  -See comment   COMMENT:  The sections show several very small needle core biopsy fragments of  apparently lymph nodal tissue displaying an atypical lymphoid  proliferation of primarily large lymphoid appearing cells characterized  by vesicular chromatin and prominent nucleoli associated with scattered  mitosis.  The appearance is apparently diffuse  with lack of atypical  follicles.  Flow cytometric analysis was performed [facility] [id number]) and  shows predominance of T lymphocytes with nonspecific changes.  No  monoclonal B-cell population identified.  A battery of  immunohistochemical stains was performed but the tissue is prominently  exhausted on deeper sectioning.  Scattered residual very minute  fragments show that the lymphoid appearing cells are positive for LCA,  CD20, CD79a, BCL6, MUM1, and Bcl-2.  No significant staining is seen  with CD138, CD10, CD34, cyclin D1, S100, cytokeratin AE1/AE3,  cytokeratin 8/18, or GATA3.  There is an admixed T-cell population to a  lesser extent as seen with CD3 and CD5 and there is no apparent  co-expression of CD5 in B-cell areas.  The following stains were  performed but there is no optimal tissue present on deeper sectioning  for evaluation including CD30, TdT, and in situ hybridization for kappa,  lambda and EBV.  The overall findings are limited but very concerning  for involvement by a B-cell lymphoproliferative process particularly  large cell lymphoma.  Excisional biopsy is recommended to further  evaluate this process.   SURGICAL PATHOLOGY 06/02/2021 FINAL MICROSCOPIC DIAGNOSIS:  A. LYMPH NODE, LEFT NECK, EXCISION:  -  Diffuse large B-cell lymphoma, activated B-cell subtype  -  See comment   COMMENT:  The excisional biopsy consists of an enlarged lymph node with complete  effacement by a large pleomorphic population of lymphoid cells with  vesicular chromatin and prominent nucleoli.  By immunohistochemistry,  the  atypical lymphocytes are positive for CD20, PAX5, Bcl-2, BCL6 and  Mum-1.  The cells are negative for CD5, CD10, CD30 and EBV by in situ  hybridization.  The proliferative rate by Ki-67 is 60-70%.  CD3  highlights background benign-appearing lymphocytes.  Overall, the  findings are consistent with a diffuse large B cell lymphoma, activated  B-cell phenotype.  Preliminary  results of this case were discussed with  Dr. Gweneth Lenis on Jun 06, 2021.  FISH studies (high-grade/large B-cell  lymphoma) are pending and will be reported in an addendum.     RADIOGRAPHIC STUDIES: I have personally reviewed the radiological images as listed and agreed with the findings in the report. No results found.    ASSESSMENT & PLAN:  68 y.o. female retired [employer] fund[employer] with previous history of right-sided breast cancer status post right mastectomy and adjuvant chemotherapy including doxorubicin now with:  #1 stage III/IV diffuse large B-cell lymphoma.-Activated B-cell subtype High risk lymphoma FISH panel no evidence of double or triple hit lymphoma #2 anemia due to lymphoma involvement of bone marrow #3 status post Port-A-Cath placement #4 thrombocytopenia secondary to #1 #5 high risk of tumor lysis syndrome #6 anxiety #7 remote history of right breast cancer status postmastectomy and adjuvant chemotherapy #8 rigors with Rituxan  dose being At 200 mg/h  Plan: -Discussed lab results from today, 06/11/23, in detail with patient Patient's CBC and CMP are within normal limits And within normal limits at 159 Patient notes no new symptoms or labs suggestive of lymphoma progression at this time  Follow-up:  Return to clinic with Dr. Salomon Cree with labs in 3 to 4 months   The total time spent in the appointment was 21 minutes* .  All of the patient's questions were answered with apparent satisfaction. The patient knows to call the clinic with any problems, questions or concerns.   Jacquelyn Matt MD MS AAHIVMS [facility] Hematology/Oncology Physician [facility]  .*Total Encounter Time as defined by the Centers for Medicare and Medicaid Services includes, in addition to the face-to-face time of a patient visit (documented in the note above) non-face-to-face time: obtaining and reviewing outside history, ordering and reviewing medications, tests or procedures, care coordination  (communications with other health care professionals or caregivers) and documentation in the medical record.   I,Mitra Faeizi,acting as a Neurosurgeon for Jacquelyn Matt, MD.,have documented all relevant documentation on the behalf of Jacquelyn Matt, MD,as directed by  Jacquelyn Matt, MD while in the presence of Jacquelyn Matt, MD.  .I have reviewed the above documentation for accuracy and completeness, and I agree with the above. .Jewelz Kobus Kishore Kortny Lirette MD

## 2023-06-11 NOTE — Telephone Encounter (Signed)
 Left a voicemail with the scheduled appointment details and the patient will be mailed an appointment reminder.

## 2023-06-13 DIAGNOSIS — R269 Unspecified abnormalities of gait and mobility: Secondary | ICD-10-CM | POA: Diagnosis not present

## 2023-06-13 DIAGNOSIS — M6281 Muscle weakness (generalized): Secondary | ICD-10-CM | POA: Diagnosis not present

## 2023-06-13 DIAGNOSIS — M546 Pain in thoracic spine: Secondary | ICD-10-CM | POA: Diagnosis not present

## 2023-06-17 ENCOUNTER — Encounter: Payer: Self-pay | Admitting: Hematology

## 2023-06-19 DIAGNOSIS — R269 Unspecified abnormalities of gait and mobility: Secondary | ICD-10-CM | POA: Diagnosis not present

## 2023-06-19 DIAGNOSIS — M546 Pain in thoracic spine: Secondary | ICD-10-CM | POA: Diagnosis not present

## 2023-06-19 DIAGNOSIS — M6281 Muscle weakness (generalized): Secondary | ICD-10-CM | POA: Diagnosis not present

## 2023-06-25 DIAGNOSIS — M546 Pain in thoracic spine: Secondary | ICD-10-CM | POA: Diagnosis not present

## 2023-06-25 DIAGNOSIS — M6281 Muscle weakness (generalized): Secondary | ICD-10-CM | POA: Diagnosis not present

## 2023-06-25 DIAGNOSIS — R269 Unspecified abnormalities of gait and mobility: Secondary | ICD-10-CM | POA: Diagnosis not present

## 2023-07-16 DIAGNOSIS — Z6832 Body mass index (BMI) 32.0-32.9, adult: Secondary | ICD-10-CM | POA: Diagnosis not present

## 2023-07-16 DIAGNOSIS — Z01419 Encounter for gynecological examination (general) (routine) without abnormal findings: Secondary | ICD-10-CM | POA: Diagnosis not present

## 2023-07-17 DIAGNOSIS — K080 Exfoliation of teeth due to systemic causes: Secondary | ICD-10-CM | POA: Diagnosis not present

## 2023-07-18 DIAGNOSIS — R269 Unspecified abnormalities of gait and mobility: Secondary | ICD-10-CM | POA: Diagnosis not present

## 2023-07-18 DIAGNOSIS — M6281 Muscle weakness (generalized): Secondary | ICD-10-CM | POA: Diagnosis not present

## 2023-07-18 DIAGNOSIS — M546 Pain in thoracic spine: Secondary | ICD-10-CM | POA: Diagnosis not present

## 2023-07-23 DIAGNOSIS — K080 Exfoliation of teeth due to systemic causes: Secondary | ICD-10-CM | POA: Diagnosis not present

## 2023-08-14 DIAGNOSIS — I10 Essential (primary) hypertension: Secondary | ICD-10-CM | POA: Diagnosis not present

## 2023-08-14 DIAGNOSIS — Z853 Personal history of malignant neoplasm of breast: Secondary | ICD-10-CM | POA: Diagnosis not present

## 2023-08-14 DIAGNOSIS — Z6834 Body mass index (BMI) 34.0-34.9, adult: Secondary | ICD-10-CM | POA: Diagnosis not present

## 2023-08-14 DIAGNOSIS — C8510 Unspecified B-cell lymphoma, unspecified site: Secondary | ICD-10-CM | POA: Diagnosis not present

## 2023-08-14 DIAGNOSIS — M4802 Spinal stenosis, cervical region: Secondary | ICD-10-CM | POA: Diagnosis not present

## 2023-08-20 ENCOUNTER — Other Ambulatory Visit: Payer: Self-pay

## 2023-08-20 DIAGNOSIS — C8338 Diffuse large B-cell lymphoma, lymph nodes of multiple sites: Secondary | ICD-10-CM

## 2023-08-20 NOTE — Progress Notes (Signed)
 HEMATOLOGY/ONCOLOGY CLINIC NOTE  Date of Service: 08/21/2023   Patient Care Team: Skillman, Katherine E, PA-C as PCP - General (Physician Assistant)  CHIEF COMPLAINTS/PURPOSE OF CONSULTATION:  Follow-up for continued evaluation and management of diffuse large B-cell lymphoma  HISTORY OF PRESENTING ILLNESS:  Please see previous note for details on initial presentation  INTERVAL HISTORY:  Barbara Rhodes is a 68 y.o. female here for continued evaluation of diffuse large B-cell lymphoma.  Patient was last seen by me on 06/11/2023 and reported neuropathy symptoms.  She presents with a cane during today's visit. Patient reports no new concerns over the last 4 months. She denies any new unexplained fever, chills, night sweats, new lumps/bumps, new back pain, or other localized symptoms.   Patient continues to manage her lower extremity neuropathy issues. 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   She notes that her mother had dementia diagnosed at age 48-73, and patient notes that she is weary of developing dementia.   MEDICAL HISTORY:  Past Medical History:  Diagnosis Date   Arthritis    Breast cancer (HCC) 2000   Stage 2B, ER+/PR-/Her2-   Colon polyps    Diffuse large B cell lymphoma (HCC) 05/2021   Family history of adverse reaction to anesthesia 1959-09-24   Brother passed away at 30 years old from malignant hyperthermia.   Family history of breast cancer    Family history of prostate cancer    Hypertension     SURGICAL HISTORY: Past Surgical History:  Procedure Laterality Date   ABDOMINAL HYSTERECTOMY     BACK SURGERY     BREAST SURGERY     COLONOSCOPY WITH PROPOFOL      pt said propofol  burned her throat when pushed   IR US  GUIDE BX ASP/DRAIN  04/03/2021   LYMPH NODE BIOPSY Left 06/02/2021   Procedure: EXCISIONAL CERVICAL LYMPH NODE BIOPSY;  Surgeon: Curvin Deward MOULD, MD;   Location: [facility];  Service: General;  Laterality: Left;   MASTECTOMY Right 2000   MASTECTOMY Right 2000   PORT-A-CATH REMOVAL N/A 12/08/2021   Procedure: REMOVAL PORT-A-CATH;  Surgeon: Curvin Deward MOULD, MD;  Location: [facility] OR;  Service: General;  Laterality: N/A;   PORTACATH PLACEMENT Right 06/02/2021   Procedure: INSERTION PORT-A-CATH;  Surgeon: Curvin Deward MOULD, MD;  Location: [facility];  Service: General;  Laterality: Right;   POSTERIOR CERVICAL LAMINECTOMY Left 01/23/2018   Procedure: LEFT CERVICAL SEVEN- THORACIC ONE LAMINOTOMY,FORAMINOTOMY, MICRODISCECTOMY;  Surgeon: Alix Charleston, MD;  Location: [facility] OR;  Service: Neurosurgery;  Laterality: Left;   ROTATOR CUFF REPAIR     TONSILLECTOMY     TRANSFORAMINAL LUMBAR INTERBODY FUSION (TLIF) WITH PEDICLE SCREW FIXATION 2 LEVEL Right 10/30/2019   Procedure: Right Lumbar 3-4 Lumbar 4-5 Transforaminal lumbar interbody fusion;  Surgeon: Unice Pac, MD;  Location: [facility];  Service: Neurosurgery;  Laterality: Right;  3C/RM 21   TUBAL LIGATION     WISDOM TOOTH EXTRACTION      SOCIAL HISTORY: Social History   Socioeconomic History   Marital status: Married    Spouse name: Not on file   Number of children: Not on file   Years of education: Not on file   Highest education level: Not on file  Occupational History   Not on file  Tobacco Use   Smoking status: Never   Smokeless tobacco: Never  Vaping Use   Vaping status:  Never Used  Substance and Sexual Activity   Alcohol use: No   Drug use: No   Sexual activity: Not on file  Other Topics Concern   Not on file  Social History Narrative   Not on file   Social Drivers of Health   Financial Resource Strain: Not on file  Food Insecurity: Not on file  Transportation Needs: Not on file  Physical Activity: Not on file  Stress: Not on file  Social Connections: Not on file  Intimate Partner Violence: Not on file    FAMILY HISTORY: Family History  Problem Relation Age of Onset   Breast cancer  Paternal Grandmother 50   Prostate cancer Father        dx in his 80s   Colon polyps Father 47   Dementia Mother    Hypertension Mother    Dementia Maternal Aunt    Stroke Maternal Grandfather    Rheum arthritis Paternal Grandfather    Breast cancer Other        MGM's sister, post-menopausal breast cancer   Breast cancer Cousin 8       paternal 2nd cousin    ALLERGIES:  is allergic to penicillins and rituximab -pvvr.  MEDICATIONS:  Current Outpatient Medications  Medication Sig Dispense Refill   acetaminophen  (TYLENOL ) 500 MG tablet Take 1,000 mg by mouth daily as needed for mild pain or moderate pain.     B Complex-C (B-COMPLEX WITH VITAMIN C) tablet Take 1 tablet by mouth daily.     baclofen (LIORESAL) 10 MG tablet Take 10 mg by mouth daily.     calcium  citrate (CALCITRATE - DOSED IN MG ELEMENTAL CALCIUM ) 950 (200 Ca) MG tablet Take 200 mg of elemental calcium  by mouth daily.     celecoxib  (CELEBREX ) 100 MG capsule Take 100 mg by mouth daily.     Cholecalciferol  (VITAMIN D3) 50 MCG (2000 UT) capsule Take 4,000 Units by mouth daily.     docusate sodium  (COLACE) 100 MG capsule Take 100 mg by mouth daily as needed for mild constipation.     Magnesium  400 MG TABS Take 400 mg by mouth daily.     Omega-3 Fatty Acids (FISH OIL  ULTRA) 1400 MG CAPS Take 1,400 mg by mouth daily.     oxyCODONE  (OXY IR/ROXICODONE ) 5 MG immediate release tablet Take 5 mg by mouth daily as needed.     Potassium 99 MG TABS Take 99 mg by mouth daily.     vitamin B-12 (CYANOCOBALAMIN) 100 MCG tablet Take 100 mcg by mouth daily.     No current facility-administered medications for this visit.    REVIEW OF SYSTEMS:   10 Point review of Systems was done is negative except as noted above.   PHYSICAL EXAMINATION:  .BP 132/75   Pulse 68   Temp (!) 97.2 F (36.2 C)   Resp 18   Wt 210 lb 12.8 oz (95.6 kg)   SpO2 98%   BMI 36.76 kg/m   GENERAL:alert, in no acute distress and comfortable SKIN: no acute  rashes, no significant lesions EYES: conjunctiva are pink and non-injected, sclera anicteric OROPHARYNX: MMM, no exudates, no oropharyngeal erythema or ulceration NECK: supple, no JVD LYMPH:  no palpable lymphadenopathy in the cervical, axillary or inguinal regions LUNGS: clear to auscultation b/l with normal respiratory effort HEART: regular rate & rhythm ABDOMEN:  normoactive bowel sounds , non tender, not distended. Extremity: no pedal edema PSYCH: alert & oriented x 3 with fluent speech NEURO: no focal motor/sensory deficits  LABORATORY DATA:  I have reviewed the data as listed    Latest Ref Rng & Units 08/21/2023    9:49 AM 06/11/2023    8:31 AM 03/19/2023    8:45 AM  CBC  WBC 4.0 - 10.5 K/uL 5.5  5.4  4.9   Hemoglobin 12.0 - 15.0 g/dL 86.8  86.2  86.6   Hematocrit 36.0 - 46.0 % 38.8  40.9  38.8   Platelets 150 - 400 K/uL 227  228  233       Latest Ref Rng & Units 08/21/2023    9:49 AM 06/11/2023    8:31 AM 03/19/2023    8:45 AM  CMP  Glucose 70 - 99 mg/dL 896  98  893   BUN 8 - 23 mg/dL 11  15  15    Creatinine 0.44 - 1.00 mg/dL 9.28  9.27  9.21   Sodium 135 - 145 mmol/L 142  142  143   Potassium 3.5 - 5.1 mmol/L 3.8  4.0  3.9   Chloride 98 - 111 mmol/L 107  106  109   CO2 22 - 32 mmol/L 31  30  30    Calcium  8.9 - 10.3 mg/dL 8.9  9.2  8.9   Total Protein 6.5 - 8.1 g/dL 6.6  6.9  6.4   Total Bilirubin 0.0 - 1.2 mg/dL 0.5  0.5  0.5   Alkaline Phos 38 - 126 U/L 88  95  94   AST 15 - 41 U/L 19  20  18    ALT 0 - 44 U/L 22  23  19     Lab Results  Component Value Date   LDH 178 08/21/2023    SURGICAL PATHOLOGY 04/03/2021 FINAL MICROSCOPIC DIAGNOSIS:  A. LYMPH NODE, LEFT SUPRACLAVICULAR, NEEDLE CORE BIOPSY:  -Atypical lymphoid proliferation  -See comment   COMMENT:  The sections show several very small needle core biopsy fragments of  apparently lymph nodal tissue displaying an atypical lymphoid  proliferation of primarily large lymphoid appearing cells characterized   by vesicular chromatin and prominent nucleoli associated with scattered  mitosis.  The appearance is apparently diffuse with lack of atypical  follicles.  Flow cytometric analysis was performed [facility] [id number]) and  shows predominance of T lymphocytes with nonspecific changes.  No  monoclonal B-cell population identified.  A battery of  immunohistochemical stains was performed but the tissue is prominently  exhausted on deeper sectioning.  Scattered residual very minute  fragments show that the lymphoid appearing cells are positive for LCA,  CD20, CD79a, BCL6, MUM1, and Bcl-2.  No significant staining is seen  with CD138, CD10, CD34, cyclin D1, S100, cytokeratin AE1/AE3,  cytokeratin 8/18, or GATA3.  There is an admixed T-cell population to a  lesser extent as seen with CD3 and CD5 and there is no apparent  co-expression of CD5 in B-cell areas.  The following stains were  performed but there is no optimal tissue present on deeper sectioning  for evaluation including CD30, TdT, and in situ hybridization for kappa,  lambda and EBV.  The overall findings are limited but very concerning  for involvement by a B-cell lymphoproliferative process particularly  large cell lymphoma.  Excisional biopsy is recommended to further  evaluate this process.   SURGICAL PATHOLOGY 06/02/2021 FINAL MICROSCOPIC DIAGNOSIS:  A. LYMPH NODE, LEFT NECK, EXCISION:  -  Diffuse large B-cell lymphoma, activated B-cell subtype  -  See comment   COMMENT:  The excisional biopsy consists of an enlarged lymph node with  complete  effacement by a large pleomorphic population of lymphoid cells with  vesicular chromatin and prominent nucleoli.  By immunohistochemistry,  the atypical lymphocytes are positive for CD20, PAX5, Bcl-2, BCL6 and  Mum-1.  The cells are negative for CD5, CD10, CD30 and EBV by in situ  hybridization.  The proliferative rate by Ki-67 is 60-70%.  CD3  highlights background benign-appearing lymphocytes.   Overall, the  findings are consistent with a diffuse large B cell lymphoma, activated  B-cell phenotype.  Preliminary results of this case were discussed with  Dr. Cleotilde on Jun 06, 2021.  FISH studies (high-grade/large B-cell  lymphoma) are pending and will be reported in an addendum.     RADIOGRAPHIC STUDIES: I have personally reviewed the radiological images as listed and agreed with the findings in the report. No results found.    ASSESSMENT & PLAN:  68 y.o. female retired [employer] fund[employer] with previous history of right-sided breast cancer status post right mastectomy and adjuvant chemotherapy including doxorubicin now with:  #1 stage III/IV diffuse large B-cell lymphoma.-Activated B-cell subtype High risk lymphoma FISH panel no evidence of double or triple hit lymphoma #2 anemia due to lymphoma involvement of bone marrow #3 status post Port-A-Cath placement #4 thrombocytopenia secondary to #1 #5 high risk of tumor lysis syndrome #6 anxiety #7 remote history of right breast cancer status postmastectomy and adjuvant chemotherapy #8 rigors with Rituxan  dose being At 200 mg/h  Plan:  -Discussed lab results on 08/21/2023 in detail with patient. CBC normal, showed WBC of 5.5K, hemoglobin of 13.1, and platelets of 227K. -CMP normal -LDH normal -did not feel any enlarged lymph nodes during physical examination -Patient notes no new symptoms or labs suggestive of lymphoma progression at this time  -patient is noted to be 2 years out from her last treatment -discussed option of scan to evaluate baseline at 2 year mark, though there is no strong indication for this.  -discussed that after 2 years of no disease progression, the risk of recurrence drops to less than 5% -answered all of patient's questions in detail -she shall return to clinic in 6 months  Follow-up: Return to clinic with Dr. Onesimo with labs in 6 months   The total time spent in the appointment was 20 minutes* .  All of the  patient's questions were answered with apparent satisfaction. The patient knows to call the clinic with any problems, questions or concerns.   Emaline Onesimo MD MS AAHIVMS [facility] Hematology/Oncology Physician [facility]  .*Total Encounter Time as defined by the Centers for Medicare and Medicaid Services includes, in addition to the face-to-face time of a patient visit (documented in the note above) non-face-to-face time: obtaining and reviewing outside history, ordering and reviewing medications, tests or procedures, care coordination (communications with other health care professionals or caregivers) and documentation in the medical record.    I,Mitra Faeizi,acting as a Neurosurgeon for Emaline Onesimo, MD.,have documented all relevant documentation on the behalf of Emaline Onesimo, MD,as directed by  Emaline Onesimo, MD while in the presence of Emaline Onesimo, MD.  .I have reviewed the above documentation for accuracy and completeness, and I agree with the above. .Caelen Reierson Kishore Joyous Gleghorn MD

## 2023-08-21 ENCOUNTER — Inpatient Hospital Stay: Admitting: Hematology

## 2023-08-21 ENCOUNTER — Inpatient Hospital Stay: Attending: Hematology

## 2023-08-21 VITALS — BP 132/75 | HR 68 | Temp 97.2°F | Resp 18 | Wt 210.8 lb

## 2023-08-21 DIAGNOSIS — D630 Anemia in neoplastic disease: Secondary | ICD-10-CM | POA: Diagnosis not present

## 2023-08-21 DIAGNOSIS — Z9221 Personal history of antineoplastic chemotherapy: Secondary | ICD-10-CM | POA: Diagnosis not present

## 2023-08-21 DIAGNOSIS — C8338 Diffuse large B-cell lymphoma, lymph nodes of multiple sites: Secondary | ICD-10-CM

## 2023-08-21 DIAGNOSIS — R6889 Other general symptoms and signs: Secondary | ICD-10-CM | POA: Insufficient documentation

## 2023-08-21 DIAGNOSIS — D6959 Other secondary thrombocytopenia: Secondary | ICD-10-CM | POA: Insufficient documentation

## 2023-08-21 DIAGNOSIS — F419 Anxiety disorder, unspecified: Secondary | ICD-10-CM | POA: Insufficient documentation

## 2023-08-21 DIAGNOSIS — Z853 Personal history of malignant neoplasm of breast: Secondary | ICD-10-CM | POA: Insufficient documentation

## 2023-08-21 DIAGNOSIS — Z8042 Family history of malignant neoplasm of prostate: Secondary | ICD-10-CM | POA: Diagnosis not present

## 2023-08-21 DIAGNOSIS — C8330 Diffuse large B-cell lymphoma, unspecified site: Secondary | ICD-10-CM | POA: Insufficient documentation

## 2023-08-21 DIAGNOSIS — Z79899 Other long term (current) drug therapy: Secondary | ICD-10-CM | POA: Diagnosis not present

## 2023-08-21 DIAGNOSIS — Z83719 Family history of colon polyps, unspecified: Secondary | ICD-10-CM | POA: Insufficient documentation

## 2023-08-21 DIAGNOSIS — Z803 Family history of malignant neoplasm of breast: Secondary | ICD-10-CM | POA: Diagnosis not present

## 2023-08-21 LAB — CMP (CANCER CENTER ONLY)
ALT: 22 U/L (ref 0–44)
AST: 19 U/L (ref 15–41)
Albumin: 4 g/dL (ref 3.5–5.0)
Alkaline Phosphatase: 88 U/L (ref 38–126)
Anion gap: 4 — ABNORMAL LOW (ref 5–15)
BUN: 11 mg/dL (ref 8–23)
CO2: 31 mmol/L (ref 22–32)
Calcium: 8.9 mg/dL (ref 8.9–10.3)
Chloride: 107 mmol/L (ref 98–111)
Creatinine: 0.71 mg/dL (ref 0.44–1.00)
GFR, Estimated: >60 mL/min (ref >60)
Glucose, Bld: 103 mg/dL — ABNORMAL HIGH (ref 70–99)
Potassium: 3.8 mmol/L (ref 3.5–5.1)
Sodium: 142 mmol/L (ref 135–145)
Total Bilirubin: 0.5 mg/dL (ref 0.0–1.2)
Total Protein: 6.6 g/dL (ref 6.5–8.1)

## 2023-08-21 LAB — CBC WITH DIFFERENTIAL (CANCER CENTER ONLY)
Abs Immature Granulocytes: 0.01 K/uL (ref 0.00–0.07)
Basophils Absolute: 0 K/uL (ref 0.0–0.1)
Basophils Relative: 1 %
Eosinophils Absolute: 0.2 K/uL (ref 0.0–0.5)
Eosinophils Relative: 4 %
HCT: 38.8 % (ref 36.0–46.0)
Hemoglobin: 13.1 g/dL (ref 12.0–15.0)
Immature Granulocytes: 0 %
Lymphocytes Relative: 37 %
Lymphs Abs: 2.1 K/uL (ref 0.7–4.0)
MCH: 30.5 pg (ref 26.0–34.0)
MCHC: 33.8 g/dL (ref 30.0–36.0)
MCV: 90.4 fL (ref 80.0–100.0)
Monocytes Absolute: 0.4 K/uL (ref 0.1–1.0)
Monocytes Relative: 7 %
Neutro Abs: 2.8 K/uL (ref 1.7–7.7)
Neutrophils Relative %: 51 %
Platelet Count: 227 K/uL (ref 150–400)
RBC: 4.29 MIL/uL (ref 3.87–5.11)
RDW: 13.6 % (ref 11.5–15.5)
WBC Count: 5.5 K/uL (ref 4.0–10.5)
nRBC: 0 % (ref 0.0–0.2)

## 2023-08-21 LAB — LACTATE DEHYDROGENASE: LDH: 178 U/L (ref 98–192)

## 2023-08-22 DIAGNOSIS — Z0001 Encounter for general adult medical examination with abnormal findings: Secondary | ICD-10-CM | POA: Diagnosis not present

## 2023-08-22 DIAGNOSIS — Z6834 Body mass index (BMI) 34.0-34.9, adult: Secondary | ICD-10-CM | POA: Diagnosis not present

## 2023-08-22 DIAGNOSIS — Z1331 Encounter for screening for depression: Secondary | ICD-10-CM | POA: Diagnosis not present

## 2023-08-22 DIAGNOSIS — Z0000 Encounter for general adult medical examination without abnormal findings: Secondary | ICD-10-CM | POA: Diagnosis not present

## 2023-08-22 DIAGNOSIS — Z1389 Encounter for screening for other disorder: Secondary | ICD-10-CM | POA: Diagnosis not present

## 2023-08-27 ENCOUNTER — Encounter: Payer: Self-pay | Admitting: Hematology

## 2023-09-09 DIAGNOSIS — K080 Exfoliation of teeth due to systemic causes: Secondary | ICD-10-CM | POA: Diagnosis not present

## 2023-09-09 DIAGNOSIS — H353112 Nonexudative age-related macular degeneration, right eye, intermediate dry stage: Secondary | ICD-10-CM | POA: Diagnosis not present

## 2023-10-25 DIAGNOSIS — L570 Actinic keratosis: Secondary | ICD-10-CM | POA: Diagnosis not present

## 2023-10-25 DIAGNOSIS — D485 Neoplasm of uncertain behavior of skin: Secondary | ICD-10-CM | POA: Diagnosis not present

## 2023-10-25 DIAGNOSIS — L814 Other melanin hyperpigmentation: Secondary | ICD-10-CM | POA: Diagnosis not present

## 2023-10-25 DIAGNOSIS — L821 Other seborrheic keratosis: Secondary | ICD-10-CM | POA: Diagnosis not present

## 2023-10-25 DIAGNOSIS — D2272 Melanocytic nevi of left lower limb, including hip: Secondary | ICD-10-CM | POA: Diagnosis not present

## 2023-10-25 DIAGNOSIS — D2371 Other benign neoplasm of skin of right lower limb, including hip: Secondary | ICD-10-CM | POA: Diagnosis not present

## 2023-11-04 DIAGNOSIS — D2271 Melanocytic nevi of right lower limb, including hip: Secondary | ICD-10-CM | POA: Diagnosis not present

## 2023-11-04 DIAGNOSIS — D485 Neoplasm of uncertain behavior of skin: Secondary | ICD-10-CM | POA: Diagnosis not present

## 2023-11-04 DIAGNOSIS — D2272 Melanocytic nevi of left lower limb, including hip: Secondary | ICD-10-CM | POA: Diagnosis not present

## 2023-11-04 DIAGNOSIS — L821 Other seborrheic keratosis: Secondary | ICD-10-CM | POA: Diagnosis not present

## 2023-11-04 DIAGNOSIS — L988 Other specified disorders of the skin and subcutaneous tissue: Secondary | ICD-10-CM | POA: Diagnosis not present

## 2023-11-08 ENCOUNTER — Encounter: Payer: Self-pay | Admitting: Hematology

## 2023-11-13 ENCOUNTER — Encounter: Payer: Self-pay | Admitting: Podiatry

## 2023-11-13 ENCOUNTER — Ambulatory Visit: Admitting: Podiatry

## 2023-11-13 DIAGNOSIS — G629 Polyneuropathy, unspecified: Secondary | ICD-10-CM

## 2023-11-13 DIAGNOSIS — M7751 Other enthesopathy of right foot: Secondary | ICD-10-CM | POA: Diagnosis not present

## 2023-11-13 DIAGNOSIS — M7752 Other enthesopathy of left foot: Secondary | ICD-10-CM

## 2023-11-13 MED ORDER — TRIAMCINOLONE ACETONIDE 10 MG/ML IJ SUSP
10.0000 mg | Freq: Once | INTRAMUSCULAR | Status: AC
  Administered 2023-11-13: 10 mg via INTRA_ARTICULAR

## 2023-11-13 NOTE — Progress Notes (Signed)
 Subjective:   Patient ID: Barbara Rhodes, female   DOB: 68 y.o.   MRN: [id number]   HPI Patient states she gets quite a bit of pain in her ankles bilateral has significant neuropathy bilateral that has been due to back surgery and other issues and states that her feet just hurt   ROS      Objective:  Physical Exam  Neurovascular status intact with patient found to have moderate diminishment sharp dull vibratory inflammation of the sinus tarsi bilaterally with fluid buildup and generalized neuropathic like pain bilateral with patient bringing in numerous shoe gear that she attempts to wear     Assessment:  Inflammatory capsulitis of the sinus tarsi bilateral problem 1 problem 2 neuropathy bilateral chronic     Plan:  H&P reviewed both conditions sterile prep and injected the sinus tarsi bilateral 3 mg Kenalog  5 mg Xylocaine  applied sterile dressings and then discussed neuropathy medication she cannot take gabapentin and other medicines to toleration.  Patient will not have any treatment for that currently I am hoping that the injections will also help some degree of neuropathy

## 2024-01-28 DIAGNOSIS — C8338 Diffuse large B-cell lymphoma, lymph nodes of multiple sites: Secondary | ICD-10-CM

## 2024-02-18 ENCOUNTER — Inpatient Hospital Stay: Admitting: Hematology

## 2024-02-18 ENCOUNTER — Other Ambulatory Visit

## 2024-03-16 ENCOUNTER — Inpatient Hospital Stay: Admitting: Hematology

## 2024-03-16 ENCOUNTER — Inpatient Hospital Stay
